# Patient Record
Sex: Female | Born: 1945 | Race: White | Hispanic: No | Marital: Married | Smoking: Never smoker
Health system: Southern US, Community
[De-identification: ages and names within clinical notes are randomized; demographics above are authoritative.]

## PROBLEM LIST (undated history)

## (undated) DIAGNOSIS — C4490 Unspecified malignant neoplasm of skin, unspecified: Secondary | ICD-10-CM

## (undated) HISTORY — DX: Unspecified malignant neoplasm of skin, unspecified: C44.90

---

## 2010-12-17 ENCOUNTER — Other Ambulatory Visit: Payer: Self-pay | Admitting: Hematology & Oncology

## 2010-12-17 ENCOUNTER — Ambulatory Visit (HOSPITAL_BASED_OUTPATIENT_CLINIC_OR_DEPARTMENT_OTHER): Payer: Medicare Other | Admitting: Hematology & Oncology

## 2010-12-17 ENCOUNTER — Other Ambulatory Visit (HOSPITAL_COMMUNITY)
Admission: RE | Admit: 2010-12-17 | Discharge: 2010-12-17 | Disposition: A | Discharge status: Home / Self Care | Payer: Medicare Other | Source: Ambulatory Visit | Attending: Hematology & Oncology | Admitting: Hematology & Oncology

## 2010-12-17 DIAGNOSIS — C9110 Chronic lymphocytic leukemia of B-cell type not having achieved remission: Secondary | ICD-10-CM

## 2010-12-17 LAB — CBC WITH DIFFERENTIAL (CANCER CENTER ONLY)
HCT: 36.2 % (ref 34.8–46.6)
HGB: 12.2 g/dL (ref 11.6–15.9)
MCH: 32.1 pg (ref 26.0–34.0)
MCHC: 33.7 g/dL (ref 32.0–36.0)
MCV: 95 fL (ref 81–101)
Platelets: 132 10*3/uL — ABNORMAL LOW (ref 145–400)
RBC: 3.8 10*6/uL (ref 3.70–5.32)
RDW: 13.2 % (ref 11.1–15.7)
WBC: 48.5 10*3/uL — ABNORMAL HIGH (ref 3.9–10.0)

## 2010-12-17 LAB — MANUAL DIFFERENTIAL (CHCC SATELLITE)
ALC: 46.6 10*3/uL — ABNORMAL HIGH (ref 0.6–2.2)
ANC (CHCC HP manual diff): 1.5 10*3/uL (ref 1.5–6.7)
BASO: 0 % (ref 0–2)
Band Neutrophils: 0 % (ref 0–10)
Blasts: 0 % (ref 0–0)
Eos: 0 % (ref 0–7)
LYMPH: 96 % — ABNORMAL HIGH (ref 14–48)
MONO: 1 % (ref 0–13)
Metamyelocytes: 0 % (ref 0–0)
Myelocytes: 0 % (ref 0–0)
Other Cells: 0 % (ref 0–0)
Other Comments: 0
PLT EST ~~LOC~~: DECREASED
PROMYELO: 0 % (ref 0–0)
RBC Comments: NORMAL
SEG: 3 % — ABNORMAL LOW (ref 40–75)
Variant Lymph: 0 % (ref 0–0)
nRBC: 0 % (ref 0–0)

## 2010-12-17 LAB — CHCC SATELLITE - SMEAR

## 2010-12-18 ENCOUNTER — Ambulatory Visit: Payer: Medicare Other | Admitting: Hematology & Oncology

## 2010-12-19 LAB — COMPREHENSIVE METABOLIC PANEL
ALT: 21 U/L (ref 0–35)
AST: 30 U/L (ref 0–37)
Albumin: 4.3 g/dL (ref 3.5–5.2)
Alkaline Phosphatase: 59 U/L (ref 39–117)
BUN: 13 mg/dL (ref 6–23)
CO2: 23 mEq/L (ref 19–32)
Calcium: 8.8 mg/dL (ref 8.4–10.5)
Chloride: 103 mEq/L (ref 96–112)
Creatinine, Ser: 0.87 mg/dL (ref 0.40–1.20)
Glucose, Bld: 85 mg/dL (ref 70–99)
Potassium: 4.1 mEq/L (ref 3.5–5.3)
Sodium: 138 mEq/L (ref 135–145)
Total Bilirubin: 1.1 mg/dL (ref 0.3–1.2)
Total Protein: 6.1 g/dL (ref 6.0–8.3)

## 2010-12-19 LAB — SPEP & IFE WITH QIG
Albumin ELP: 66.1 % (ref 55.8–66.1)
Alpha-1-Globulin: 3.6 % (ref 2.9–4.9)
Alpha-2-Globulin: 8.8 % (ref 7.1–11.8)
Beta 2: 3.9 % (ref 3.2–6.5)
Beta Globulin: 5.8 % (ref 4.7–7.2)
Gamma Globulin: 11.8 % (ref 11.1–18.8)
IgA: 130 mg/dL (ref 68–378)
IgG (Immunoglobin G), Serum: 807 mg/dL (ref 694–1618)
IgM, Serum: 43 mg/dL — ABNORMAL LOW (ref 60–263)
Total Protein, Serum Electrophoresis: 6.1 g/dL (ref 6.0–8.3)

## 2010-12-19 LAB — RETICULOCYTES (CHCC)
ABS Retic: 58.2 10*3/uL (ref 19.0–186.0)
RBC.: 3.88 MIL/uL (ref 3.87–5.11)
Retic Ct Pct: 1.5 % (ref 0.4–3.1)

## 2010-12-19 LAB — LACTATE DEHYDROGENASE: LDH: 149 U/L (ref 94–250)

## 2010-12-19 LAB — FLOW CYTOMETRY - CHCC SATELLITE

## 2010-12-19 LAB — VITAMIN D 25 HYDROXY (VIT D DEFICIENCY, FRACTURES): Vit D, 25-Hydroxy: 58 ng/mL (ref 30–89)

## 2011-04-23 ENCOUNTER — Encounter: Payer: Medicare Other | Admitting: Hematology & Oncology

## 2011-04-23 ENCOUNTER — Other Ambulatory Visit: Payer: Self-pay | Admitting: Family

## 2011-04-23 LAB — CBC WITH DIFFERENTIAL (CANCER CENTER ONLY)
BASO#: 0.1 10*3/uL (ref 0.0–0.2)
BASO%: 0.2 % (ref 0.0–2.0)
EOS%: 0.3 % (ref 0.0–7.0)
Eosinophils Absolute: 0.2 10*3/uL (ref 0.0–0.5)
HCT: 35.6 % (ref 34.8–46.6)
HGB: 12.4 g/dL (ref 11.6–15.9)
LYMPH#: 44.2 10*3/uL — ABNORMAL HIGH (ref 0.9–3.3)
LYMPH%: 93.9 % — ABNORMAL HIGH (ref 14.0–48.0)
MCH: 33.1 pg (ref 26.0–34.0)
MCHC: 34.8 g/dL (ref 32.0–36.0)
MCV: 95 fL (ref 81–101)
MONO#: 0.6 10*3/uL (ref 0.1–0.9)
MONO%: 1.3 % (ref 0.0–13.0)
NEUT#: 2 10*3/uL (ref 1.5–6.5)
NEUT%: 4.3 % — ABNORMAL LOW (ref 39.6–80.0)
Platelets: 135 10*3/uL — ABNORMAL LOW (ref 145–400)
RBC: 3.75 10*6/uL (ref 3.70–5.32)
RDW: 13.3 % (ref 11.1–15.7)
WBC: 47.1 10*3/uL — ABNORMAL HIGH (ref 3.9–10.0)

## 2011-04-23 LAB — TECHNOLOGIST REVIEW CHCC SATELLITE

## 2011-08-08 ENCOUNTER — Other Ambulatory Visit: Payer: Self-pay | Admitting: Hematology & Oncology

## 2011-08-08 ENCOUNTER — Other Ambulatory Visit (HOSPITAL_BASED_OUTPATIENT_CLINIC_OR_DEPARTMENT_OTHER): Payer: Medicare Other | Admitting: Lab

## 2011-08-08 ENCOUNTER — Ambulatory Visit (HOSPITAL_BASED_OUTPATIENT_CLINIC_OR_DEPARTMENT_OTHER): Payer: Medicare Other | Admitting: Hematology & Oncology

## 2011-08-08 VITALS — BP 115/61 | HR 77 | Temp 97.0°F | Ht 68.5 in | Wt 163.5 lb

## 2011-08-08 DIAGNOSIS — C9110 Chronic lymphocytic leukemia of B-cell type not having achieved remission: Secondary | ICD-10-CM | POA: Diagnosis not present

## 2011-08-08 DIAGNOSIS — M810 Age-related osteoporosis without current pathological fracture: Secondary | ICD-10-CM

## 2011-08-08 LAB — CBC WITH DIFFERENTIAL (CANCER CENTER ONLY)
HCT: 37.8 % (ref 34.8–46.6)
HGB: 12.5 g/dL (ref 11.6–15.9)
MCH: 32.6 pg (ref 26.0–34.0)
MCHC: 33.1 g/dL (ref 32.0–36.0)
MCV: 99 fL (ref 81–101)
Platelets: 165 10*3/uL (ref 145–400)
RBC: 3.83 10*6/uL (ref 3.70–5.32)
RDW: 13.1 % (ref 11.1–15.7)
WBC: 45.1 10*3/uL — ABNORMAL HIGH (ref 3.9–10.0)

## 2011-08-08 LAB — CHCC SATELLITE - SMEAR

## 2011-08-08 LAB — MANUAL DIFFERENTIAL (CHCC SATELLITE)
ALC: 40.1 10*3/uL — ABNORMAL HIGH (ref 0.6–2.2)
ANC (CHCC HP manual diff): 3.2 10*3/uL (ref 1.5–6.7)
Eos: 3 % (ref 0–7)
LYMPH: 89 % — ABNORMAL HIGH (ref 14–48)
MONO: 1 % (ref 0–13)
PLT EST ~~LOC~~: ADEQUATE
Platelet Morphology: NORMAL
SEG: 7 % — ABNORMAL LOW (ref 40–75)

## 2011-08-08 NOTE — Progress Notes (Signed)
This office note has been dictated.

## 2011-08-09 NOTE — Progress Notes (Signed)
CC:   Homero Fellers, MD  DIAGNOSIS:  Stage A chronic lymphocytic leukemia.  CURRENT THERAPY:  Observation.  INTERIM HISTORY:  Ms. Kun comes in for followup.  She is doing well. She has had no problems since we last saw her in May.  She and her husband are heading down to [place] for the next few months.  She has had no fevers, sweats or chills.  She is still taking her multivitamin and supplements.  Patient is staying very active.  She and her husband did go to the [place] I think this fall. They had a nice time.  She has had no rashes.  There has been no change in bowel or bladder habits.  PHYSICAL EXAM:  This is a well-developed, well-nourished white female in no obvious distress.  Vital signs:  Temperature 97, pulse 77, respiratory rate 20, blood pressure 115/61, weight is 165.  Head and neck:  Normocephalic, atraumatic skull.  There are no ocular or oral lesions.  She has no adenopathy in her neck.  Lungs:  Clear bilaterally. Cardiac:  Regular rate and rhythm with normal S1, S2.  There are no murmurs, rubs or bruits.  Axillary:  No bilateral axillary adenopathy. Abdomen:  Soft with good bowel sounds.  There is no palpable abdominal mass.  There is no fluid wave.  There is no palpable hepatosplenomegaly. Extremities:  No clubbing, cyanosis or edema.  Neurological:  No focal neurological deficits.  LABORATORY STUDIES:  White cell count is 45.1, hemoglobin 12.5, hematocrit 37.8, platelet count is 165.  IMPRESSION:  Ms. Dewberry is a 66 year old white female with stage A chronic lymphocytic leukemia.  She has really had no change in her white cell count, appears not thrombocytopenic or anemic.  She will go down to [place] for the winter.  She sees Dr. Marney Doctor down in [place].  I will plan to see her back in 6 months myself.    ______________________________ Josph Macho, M.D. PRE/MEDQ  D:  08/08/2011  T:  08/09/2011  Job:  [id number]

## 2012-01-08 ENCOUNTER — Ambulatory Visit (HOSPITAL_BASED_OUTPATIENT_CLINIC_OR_DEPARTMENT_OTHER): Payer: Medicare Other | Admitting: Hematology & Oncology

## 2012-01-08 ENCOUNTER — Other Ambulatory Visit (HOSPITAL_BASED_OUTPATIENT_CLINIC_OR_DEPARTMENT_OTHER): Payer: Medicare Other | Admitting: Lab

## 2012-01-08 ENCOUNTER — Telehealth: Payer: Self-pay | Admitting: Hematology & Oncology

## 2012-01-08 VITALS — BP 104/53 | HR 78 | Temp 97.1°F | Ht 68.0 in | Wt 161.0 lb

## 2012-01-08 DIAGNOSIS — C9110 Chronic lymphocytic leukemia of B-cell type not having achieved remission: Secondary | ICD-10-CM | POA: Diagnosis not present

## 2012-01-08 DIAGNOSIS — E559 Vitamin D deficiency, unspecified: Secondary | ICD-10-CM | POA: Diagnosis not present

## 2012-01-08 DIAGNOSIS — M810 Age-related osteoporosis without current pathological fracture: Secondary | ICD-10-CM

## 2012-01-08 LAB — CBC WITH DIFFERENTIAL (CANCER CENTER ONLY)
BASO#: 0 10*3/uL (ref 0.0–0.2)
BASO%: 0.1 % (ref 0.0–2.0)
EOS%: 0.2 % (ref 0.0–7.0)
Eosinophils Absolute: 0.1 10*3/uL (ref 0.0–0.5)
HCT: 37.3 % (ref 34.8–46.6)
HGB: 12.4 g/dL (ref 11.6–15.9)
LYMPH#: 46 10*3/uL — ABNORMAL HIGH (ref 0.9–3.3)
LYMPH%: 93.7 % — ABNORMAL HIGH (ref 14.0–48.0)
MCH: 32.3 pg (ref 26.0–34.0)
MCHC: 33.2 g/dL (ref 32.0–36.0)
MCV: 97 fL (ref 81–101)
MONO#: 1.2 10*3/uL — ABNORMAL HIGH (ref 0.1–0.9)
MONO%: 2.4 % (ref 0.0–13.0)
NEUT#: 1.8 10*3/uL (ref 1.5–6.5)
NEUT%: 3.6 % — ABNORMAL LOW (ref 39.6–80.0)
Platelets: 145 10*3/uL (ref 145–400)
RBC: 3.84 10*6/uL (ref 3.70–5.32)
RDW: 13.4 % (ref 11.1–15.7)
WBC: 49.2 10*3/uL — ABNORMAL HIGH (ref 3.9–10.0)

## 2012-01-08 LAB — TECHNOLOGIST REVIEW CHCC SATELLITE

## 2012-01-08 LAB — CHCC SATELLITE - SMEAR

## 2012-01-08 NOTE — Telephone Encounter (Signed)
Left pt message with 07-09-12 appointment and mailed schedule

## 2012-01-08 NOTE — Progress Notes (Signed)
This office note has been dictated.

## 2012-01-09 LAB — COMPREHENSIVE METABOLIC PANEL
ALT: 13 U/L (ref 0–35)
AST: 19 U/L (ref 0–37)
Albumin: 4.3 g/dL (ref 3.5–5.2)
Alkaline Phosphatase: 61 U/L (ref 39–117)
BUN: 11 mg/dL (ref 6–23)
CO2: 26 mEq/L (ref 19–32)
Calcium: 9 mg/dL (ref 8.4–10.5)
Chloride: 105 mEq/L (ref 96–112)
Creatinine, Ser: 0.94 mg/dL (ref 0.50–1.10)
Glucose, Bld: 82 mg/dL (ref 70–99)
Potassium: 4.1 mEq/L (ref 3.5–5.3)
Sodium: 139 mEq/L (ref 135–145)
Total Bilirubin: 0.9 mg/dL (ref 0.3–1.2)
Total Protein: 6.3 g/dL (ref 6.0–8.3)

## 2012-01-09 LAB — VITAMIN D 25 HYDROXY (VIT D DEFICIENCY, FRACTURES): Vit D, 25-Hydroxy: 58 ng/mL (ref 30–89)

## 2012-01-13 NOTE — Progress Notes (Signed)
CC:   Tamara Fellers, MD, Fax [phone]  DIAGNOSIS:  CLL, stage A.  CURRENT THERAPY:  Observation history.  INTERIM HISTORY:  Tamara Powell comes in for followup.  We see her every 6 months.  She is back up from [place].  She will be heading back down in about a month or so.  She is doing well.  Unfortunately, the cousin, my patient, Tamara Powell, passed away from CLL recently.  This is a big loss for everybody.  Tamara Powell was such a wonderful lady who fought very hard.  Tamara Powell, herself, has had no complaints.  There are no problems with fatigue.  She is eating well.  She is taking her mix of supplements, which she feels is having a positive impact on her health.  I really cannot argue with this.  She has had no rashes.  She has not noticed any swollen lymph glands. There has been no change in bowel or bladder habits.  PHYSICAL EXAMINATION:  This is a well-developed, well-nourished white female in no obvious distress.  Vital signs:  97.1, pulse 78, respiratory rate 18, blood pressure 104/57.  Weight is 161.  Head and neck:  A normocephalic, atraumatic skull.  There are no ocular or oral lesions.  There are no palpable cervical or supraclavicular lymph nodes. Lungs:  Clear bilaterally.  Cardiac:  Regular rate and rhythm with a normal S1 and S2.  There are no murmurs, rubs or bruits.  Axillary exam: No axillary adenopathy.  Abdomen:  Soft with good bowel sounds.  There is no palpable abdominal mass.  There is no fluid wave.  There is no palpable hepatosplenomegaly.  Back:  No tenderness over the spine, ribs or hips.  Skin:  No rashes, ecchymoses or petechia.  LABORATORY STUDIES:  White cell count is 49.1, hemoglobin 12.1, hematocrit 37.3, platelet count 145.  White cell differential shows 4% segs, 94% lymphs.  IMPRESSION:  Tamara Powell is a 66 year old white female with CLL.  She is from [place].  She had been followed down in [place] by Dr. Marney Doctor.  She has been followed for  about 5 years now.  I still do not see any indication for progression.  I do not see any indication for interventions.  We will plan to get back in another 6 months.   ______________________________ Tamara Powell, M.D. PRE/MEDQ  D:  01/08/2012  T:  01/09/2012  Job:  [id number]

## 2012-01-28 ENCOUNTER — Telehealth: Payer: Self-pay | Admitting: Hematology & Oncology

## 2012-01-28 NOTE — Telephone Encounter (Addendum)
Message copied by Cathi Roan on Tue Jan 28, 2012  2:12 PM ------      Message from: Arlan Organ R      Created: Wed Jan 15, 2012  6:51 PM       Please call and let her know that her lab work and vitamin D levels are perfect. Pete  01-28-12  2:12 pm  Called and spoke with patient regarding above MD message, she also wanted copy mailed to her, which I did.  S.Trynosky LPN

## 2012-05-16 DIAGNOSIS — B029 Zoster without complications: Secondary | ICD-10-CM | POA: Diagnosis not present

## 2012-05-19 DIAGNOSIS — B029 Zoster without complications: Secondary | ICD-10-CM | POA: Diagnosis not present

## 2012-05-26 DIAGNOSIS — M5430 Sciatica, unspecified side: Secondary | ICD-10-CM | POA: Diagnosis not present

## 2012-05-26 DIAGNOSIS — M810 Age-related osteoporosis without current pathological fracture: Secondary | ICD-10-CM | POA: Diagnosis not present

## 2012-05-26 DIAGNOSIS — H9319 Tinnitus, unspecified ear: Secondary | ICD-10-CM | POA: Diagnosis not present

## 2012-05-26 DIAGNOSIS — Z23 Encounter for immunization: Secondary | ICD-10-CM | POA: Diagnosis not present

## 2012-06-01 ENCOUNTER — Other Ambulatory Visit: Payer: Self-pay | Admitting: *Deleted

## 2012-06-01 ENCOUNTER — Telehealth: Payer: Self-pay | Admitting: Hematology & Oncology

## 2012-06-01 DIAGNOSIS — L509 Urticaria, unspecified: Secondary | ICD-10-CM

## 2012-06-01 DIAGNOSIS — C9110 Chronic lymphocytic leukemia of B-cell type not having achieved remission: Secondary | ICD-10-CM

## 2012-06-01 NOTE — Telephone Encounter (Signed)
Pt called said she needed to see MD sooner. Transferred to RN voice mail, pt aware RN will call her back

## 2012-06-01 NOTE — Progress Notes (Signed)
Pt called today with c/o of hives. Recently returned from [place]. She was treated for shingles approximately 3 weeks ago. Reported she did not complete the full course of therapy due to the side effects. This was not shared with her treating physician as "the areas were getting better". Upon further questioning it was noted that she has been using a different detergent. She gets hives where her skin comes in contact with the sheets but uses a cream that gives her moderate relief. Encouraged her to try to switch back to her old detergent and take Benadryl prn. Appt was made for her to be evaluated by the PA tomorrow as she still has some lesions where she was treated for shingles. She verbalized understanding of all of the instructions.

## 2012-06-01 NOTE — Telephone Encounter (Signed)
Pt aware of 10-29 appointment °

## 2012-06-02 ENCOUNTER — Other Ambulatory Visit (HOSPITAL_BASED_OUTPATIENT_CLINIC_OR_DEPARTMENT_OTHER): Payer: Medicare Other | Admitting: Lab

## 2012-06-02 ENCOUNTER — Other Ambulatory Visit: Payer: Self-pay | Admitting: Medical

## 2012-06-02 ENCOUNTER — Ambulatory Visit (HOSPITAL_BASED_OUTPATIENT_CLINIC_OR_DEPARTMENT_OTHER): Payer: Medicare Other | Admitting: Medical

## 2012-06-02 VITALS — BP 120/50 | HR 91 | Temp 97.7°F | Resp 18 | Ht 68.0 in | Wt 163.0 lb

## 2012-06-02 DIAGNOSIS — C9110 Chronic lymphocytic leukemia of B-cell type not having achieved remission: Secondary | ICD-10-CM

## 2012-06-02 DIAGNOSIS — H5710 Ocular pain, unspecified eye: Secondary | ICD-10-CM | POA: Diagnosis not present

## 2012-06-02 DIAGNOSIS — B029 Zoster without complications: Secondary | ICD-10-CM | POA: Diagnosis not present

## 2012-06-02 LAB — CBC WITH DIFFERENTIAL (CANCER CENTER ONLY)
BASO#: 0 10*3/uL (ref 0.0–0.2)
BASO%: 0.1 % (ref 0.0–2.0)
EOS%: 0.2 % (ref 0.0–7.0)
Eosinophils Absolute: 0.1 10*3/uL (ref 0.0–0.5)
HCT: 37.2 % (ref 34.8–46.6)
HGB: 12.5 g/dL (ref 11.6–15.9)
LYMPH#: 52.3 10*3/uL — ABNORMAL HIGH (ref 0.9–3.3)
LYMPH%: 92.7 % — ABNORMAL HIGH (ref 14.0–48.0)
MCH: 33.2 pg (ref 26.0–34.0)
MCHC: 33.6 g/dL (ref 32.0–36.0)
MCV: 99 fL (ref 81–101)
MONO#: 1.1 10*3/uL — ABNORMAL HIGH (ref 0.1–0.9)
MONO%: 1.9 % (ref 0.0–13.0)
NEUT#: 2.9 10*3/uL (ref 1.5–6.5)
NEUT%: 5.1 % — ABNORMAL LOW (ref 39.6–80.0)
Platelets: 130 10*3/uL — ABNORMAL LOW (ref 145–400)
RBC: 3.76 10*6/uL (ref 3.70–5.32)
RDW: 14 % (ref 11.1–15.7)
WBC: 56.4 10*3/uL (ref 3.9–10.0)

## 2012-06-02 LAB — TECHNOLOGIST REVIEW CHCC SATELLITE

## 2012-06-02 NOTE — Progress Notes (Addendum)
Diagnosis: CLL, stage A.   Current therapy: Observation.  Interim history: I am seeing Tamara Powell today as a work in.  Ms. Ishibashi reports, that close about a month ago.  She was diagnosed with shingles that appeared on the right side of her face.  At that time she was given steroids, along with acyclovir.  She states she took the medications for about 7 days and then discontinued them due to stomach upset, nausea, and dizziness.  Ms. Barnie Alderman, and reports, on October 12.  She noticed similar symptoms on the left side of her abdomen.  She thought that it might just be a rash and decided to wait until now to get it checked out.  Ms. Tyer does have what appears to be shingles.  She does have a band like cluster of clear vesicles on an erythematous base.  She reports, that she is using a cream called Silver Shield that calms down the tingling and burning sensation.  Unfortunately, it is not within the 72 hours to prescribe her an antiviral.  She otherwise does not report any other new problems or complications.  In terms of her CLL, she still continues to do well.  Her white count did creep up from 49.2, to 56.4, however, I did inform her that the increase may be due to the steroids, that she was on.  We will be able to get a clear picture of her white count when she returns in December.  She reports, she has a good appetite.  She denies any nausea, vomiting, diarrhea, constipation, chest pain, shortness of breath, or cough.  Denies any fevers, chills, or night sweats.  She denies any headaches, visual changes, or rashes.  Other than the shingles.  She's not noticed any swollen lymph glands.  She denies any obvious, or abnormal bleeding. Review of Systems: Pt. Denies any changes in their vision, hearing, adenopathy, fevers, chills, nausea, vomiting, diarrhea, constipation, chest pain, shortness of breath, passing blood, passing out, blacking out,  any changes in skin, joints, neurologic or psychiatric except as  noted.  Physical Exam: This is a 66 year old, well-developed, well-nourished, white female, in no obvious distress Vitals: Temperature 97.7 degrees, pulse 91, respiration 18, blood pressure 120/50.  Weight 163 pounds HEENT reveals a normocephalic, atraumatic skull, no scleral icterus, no oral lesions  Neck is supple without any cervical or supraclavicular adenopathy.  Lungs are clear to auscultation bilaterally. There are no wheezes, rales or rhonci Cardiac is regular rate and rhythm with a normal S1 and S2. There are no murmurs, rubs, or bruits.  Abdomen is soft with good bowel sounds, there is no palpable mass. There is no palpable hepatosplenomegaly. There is no palpable fluid wave.  She does have a strip of vesicular lesions on an erythematous base on the left side of her lower abdomen. Musculoskeletal no tenderness of the spine, ribs, or hips.  Extremities there are no clubbing, cyanosis, or edema.  Skin no petechia, purpura or ecchymosis Neurologic is nonfocal.  Laboratory Data: White count 56.4, hemoglobin 12.5, hematocrit 37.2, platelets 130,000  Current Outpatient Prescriptions on File Prior to Visit  Medication Sig Dispense Refill  . Apple Cid Vn-Grn Tea-Bit Or-Cr (GREEN TEA SLIM WITH EGCG PO) Take by mouth.        Marland Kitchen aspirin 81 MG tablet Take 81 mg by mouth daily.        . calcium carbonate (OS-CAL) 600 MG TABS Take 600 mg by mouth 2 (two) times daily with a meal.        .  co-enzyme Q-10 30 MG capsule Take 100 mg by mouth 2 (two) times daily.        Marland Kitchen conjugated estrogens (PREMARIN) vaginal cream Place vaginally daily.        . Evening Primrose Oil 500 MG CAPS Take 500 mg by mouth 2 (two) times daily.        . fish oil-omega-3 fatty acids 1000 MG capsule Take 1 g by mouth 2 (two) times daily.        . Multiple Vitamin (MULTIVITAMIN) tablet Take 1 tablet by mouth daily.        . Nutritional Supplements (GRAPESEED EXTRACT) 500-50 MG CAPS Take by mouth 2 (two) times daily.        Marland Kitchen  OVER THE COUNTER MEDICATION Take by mouth daily. Herb that give her  a calming effect with energy.      . Turmeric 450 MG CAPS Take by mouth every morning.       Assessment/Plan: This is a pleasant, 66 year old, female, with the following issues:  #1 .  Shingles.  She does have a propensity of getting shingles, secondary to her CLL.  I am wondering if the steroids, that she was placed on for her previous episode of shingles caused another outbreak.  She did discontinue the steroids.  She will continue using her Silver Shield, lotion, which seems to help.  She did not want anything for pain.  Hopefully, this will resolve within 2-4 weeks.  I informed her that if she has any more outbreaks or problems with her skin to let us know.  #2 CLL.  This is stage A.  At this point, we will continue to follow her with blood work.  We will have a better idea of what her white count is when she returns in December.  Currently, there is no indication for interventions.  #3 prophylaxis.  I will go ahead and call in Famvir 250 mg to take on a daily basis.  #4 followup.  Ms. Bischoff follow back up with [place] in December, but before then should there be questions or concerns.

## 2012-06-29 DIAGNOSIS — M999 Biomechanical lesion, unspecified: Secondary | ICD-10-CM | POA: Diagnosis not present

## 2012-06-29 DIAGNOSIS — M62838 Other muscle spasm: Secondary | ICD-10-CM | POA: Diagnosis not present

## 2012-06-29 DIAGNOSIS — M5137 Other intervertebral disc degeneration, lumbosacral region: Secondary | ICD-10-CM | POA: Diagnosis not present

## 2012-06-29 DIAGNOSIS — M5430 Sciatica, unspecified side: Secondary | ICD-10-CM | POA: Diagnosis not present

## 2012-06-30 DIAGNOSIS — M999 Biomechanical lesion, unspecified: Secondary | ICD-10-CM | POA: Diagnosis not present

## 2012-06-30 DIAGNOSIS — M5137 Other intervertebral disc degeneration, lumbosacral region: Secondary | ICD-10-CM | POA: Diagnosis not present

## 2012-06-30 DIAGNOSIS — M62838 Other muscle spasm: Secondary | ICD-10-CM | POA: Diagnosis not present

## 2012-06-30 DIAGNOSIS — M5430 Sciatica, unspecified side: Secondary | ICD-10-CM | POA: Diagnosis not present

## 2012-07-01 DIAGNOSIS — M62838 Other muscle spasm: Secondary | ICD-10-CM | POA: Diagnosis not present

## 2012-07-01 DIAGNOSIS — M999 Biomechanical lesion, unspecified: Secondary | ICD-10-CM | POA: Diagnosis not present

## 2012-07-01 DIAGNOSIS — M5430 Sciatica, unspecified side: Secondary | ICD-10-CM | POA: Diagnosis not present

## 2012-07-01 DIAGNOSIS — M5137 Other intervertebral disc degeneration, lumbosacral region: Secondary | ICD-10-CM | POA: Diagnosis not present

## 2012-07-03 DIAGNOSIS — M62838 Other muscle spasm: Secondary | ICD-10-CM | POA: Diagnosis not present

## 2012-07-03 DIAGNOSIS — M5430 Sciatica, unspecified side: Secondary | ICD-10-CM | POA: Diagnosis not present

## 2012-07-03 DIAGNOSIS — M999 Biomechanical lesion, unspecified: Secondary | ICD-10-CM | POA: Diagnosis not present

## 2012-07-03 DIAGNOSIS — M5137 Other intervertebral disc degeneration, lumbosacral region: Secondary | ICD-10-CM | POA: Diagnosis not present

## 2012-07-06 DIAGNOSIS — M5430 Sciatica, unspecified side: Secondary | ICD-10-CM | POA: Diagnosis not present

## 2012-07-06 DIAGNOSIS — M62838 Other muscle spasm: Secondary | ICD-10-CM | POA: Diagnosis not present

## 2012-07-06 DIAGNOSIS — M999 Biomechanical lesion, unspecified: Secondary | ICD-10-CM | POA: Diagnosis not present

## 2012-07-06 DIAGNOSIS — M5137 Other intervertebral disc degeneration, lumbosacral region: Secondary | ICD-10-CM | POA: Diagnosis not present

## 2012-07-07 DIAGNOSIS — M999 Biomechanical lesion, unspecified: Secondary | ICD-10-CM | POA: Diagnosis not present

## 2012-07-07 DIAGNOSIS — M5430 Sciatica, unspecified side: Secondary | ICD-10-CM | POA: Diagnosis not present

## 2012-07-07 DIAGNOSIS — M5137 Other intervertebral disc degeneration, lumbosacral region: Secondary | ICD-10-CM | POA: Diagnosis not present

## 2012-07-07 DIAGNOSIS — M62838 Other muscle spasm: Secondary | ICD-10-CM | POA: Diagnosis not present

## 2012-07-09 ENCOUNTER — Other Ambulatory Visit (HOSPITAL_BASED_OUTPATIENT_CLINIC_OR_DEPARTMENT_OTHER): Payer: Medicare Other | Admitting: Lab

## 2012-07-09 ENCOUNTER — Ambulatory Visit (HOSPITAL_BASED_OUTPATIENT_CLINIC_OR_DEPARTMENT_OTHER): Payer: Medicare Other | Admitting: Hematology & Oncology

## 2012-07-09 VITALS — BP 110/52 | HR 88 | Temp 97.7°F | Resp 16 | Ht 68.0 in | Wt 165.0 lb

## 2012-07-09 DIAGNOSIS — M5430 Sciatica, unspecified side: Secondary | ICD-10-CM | POA: Diagnosis not present

## 2012-07-09 DIAGNOSIS — C9110 Chronic lymphocytic leukemia of B-cell type not having achieved remission: Secondary | ICD-10-CM | POA: Diagnosis not present

## 2012-07-09 DIAGNOSIS — M62838 Other muscle spasm: Secondary | ICD-10-CM | POA: Diagnosis not present

## 2012-07-09 DIAGNOSIS — M999 Biomechanical lesion, unspecified: Secondary | ICD-10-CM | POA: Diagnosis not present

## 2012-07-09 DIAGNOSIS — M5137 Other intervertebral disc degeneration, lumbosacral region: Secondary | ICD-10-CM | POA: Diagnosis not present

## 2012-07-09 LAB — CBC WITH DIFFERENTIAL (CANCER CENTER ONLY)
BASO#: 0.2 10*3/uL (ref 0.0–0.2)
BASO%: 0.4 % (ref 0.0–2.0)
EOS%: 0.4 % (ref 0.0–7.0)
Eosinophils Absolute: 0.2 10*3/uL (ref 0.0–0.5)
HCT: 38.2 % (ref 34.8–46.6)
HGB: 13 g/dL (ref 11.6–15.9)
LYMPH#: 38.7 10*3/uL — ABNORMAL HIGH (ref 0.9–3.3)
LYMPH%: 90.8 % — ABNORMAL HIGH (ref 14.0–48.0)
MCH: 32.8 pg (ref 26.0–34.0)
MCHC: 34 g/dL (ref 32.0–36.0)
MCV: 97 fL (ref 81–101)
MONO#: 0.8 10*3/uL (ref 0.1–0.9)
MONO%: 1.9 % (ref 0.0–13.0)
NEUT#: 2.8 10*3/uL (ref 1.5–6.5)
NEUT%: 6.5 % — ABNORMAL LOW (ref 39.6–80.0)
Platelets: 177 10*3/uL (ref 145–400)
RBC: 3.96 10*6/uL (ref 3.70–5.32)
RDW: 13.2 % (ref 11.1–15.7)
WBC: 42.6 10*3/uL — ABNORMAL HIGH (ref 3.9–10.0)

## 2012-07-09 LAB — CHCC SATELLITE - SMEAR

## 2012-07-09 LAB — TECHNOLOGIST REVIEW CHCC SATELLITE

## 2012-07-09 NOTE — Progress Notes (Signed)
This office note has been dictated.

## 2012-07-13 DIAGNOSIS — M5430 Sciatica, unspecified side: Secondary | ICD-10-CM | POA: Diagnosis not present

## 2012-07-13 DIAGNOSIS — M5137 Other intervertebral disc degeneration, lumbosacral region: Secondary | ICD-10-CM | POA: Diagnosis not present

## 2012-07-13 DIAGNOSIS — M999 Biomechanical lesion, unspecified: Secondary | ICD-10-CM | POA: Diagnosis not present

## 2012-07-13 DIAGNOSIS — M62838 Other muscle spasm: Secondary | ICD-10-CM | POA: Diagnosis not present

## 2012-07-13 NOTE — Progress Notes (Signed)
CC:   Homero Fellers, MD, Fax [phone]  DIAGNOSIS:  Stage A chronic lymphocytic leukemia.  CURRENT THERAPY:  Observation.  INTERIM HISTORY:  Ms. Ruddy comes in for her followup.  She is really doing well.  She is having no issues at all.  She did have some shingles, I think in the right side of her face.  This was probably about 2 months ago.  This has not caused her any problems with post- herpetic neuralgia.  She has sciatica down the left leg.  She is seeing a [facility] for this.  She has not had any problems with fevers, sweats, or chills.  There is no change in bowel or bladder habits.  There is no cough.  There is no abdominal pain.  PHYSICAL EXAMINATION:  General:  This is a well-developed, well- nourished white female in no obvious distress.  Vital signs: Temperature 97.7, pulse 88, respiratory rate 16, blood pressure 110/52. Weight is 165.  Head and neck:  Normocephalic, atraumatic skull.  There are no ocular or oral lesions.  There are no palpable cervical or supraclavicular lymph nodes.  Lungs:  Clear bilaterally.  Cardiac: Regular rate and rhythm with a normal S1 and S2.  There are no murmurs, rubs, or bruits.  Axillary:  Shows no bilateral axillary adenopathy. Abdomen:  Soft with good bowel sounds.  There is no fluid wave.  There is no palpable hepatosplenomegaly.  Extremities:  No clubbing, cyanosis, or edema.  LABORATORY STUDIES:  White cell count is 42.6, hemoglobin is 13, hematocrit 38.2, platelet count 177.  White cell differential shows 6.5 segs and 91 lymphs.  IMPRESSION:  Ms. Vreeland is a very nice 66 year old white female with stage A CLL.  She is doing well with this.  We do not have to treat her from my point of view.  We will plan to get her back in about 3 or 4 months now.  I feel confident that we can get her through the wintertime without having to see her back.  I think if she gets another episode of shingles, then she will definitely need to  be on long-term Famvir.    ______________________________ Josph Macho, M.D. PRE/MEDQ  D:  07/09/2012  T:  07/10/2012  Job:  [id number]

## 2012-07-14 DIAGNOSIS — M999 Biomechanical lesion, unspecified: Secondary | ICD-10-CM | POA: Diagnosis not present

## 2012-07-14 DIAGNOSIS — M5430 Sciatica, unspecified side: Secondary | ICD-10-CM | POA: Diagnosis not present

## 2012-07-14 DIAGNOSIS — M5137 Other intervertebral disc degeneration, lumbosacral region: Secondary | ICD-10-CM | POA: Diagnosis not present

## 2012-07-14 DIAGNOSIS — M62838 Other muscle spasm: Secondary | ICD-10-CM | POA: Diagnosis not present

## 2012-07-16 DIAGNOSIS — M62838 Other muscle spasm: Secondary | ICD-10-CM | POA: Diagnosis not present

## 2012-07-16 DIAGNOSIS — M999 Biomechanical lesion, unspecified: Secondary | ICD-10-CM | POA: Diagnosis not present

## 2012-07-16 DIAGNOSIS — M5430 Sciatica, unspecified side: Secondary | ICD-10-CM | POA: Diagnosis not present

## 2012-07-16 DIAGNOSIS — M5137 Other intervertebral disc degeneration, lumbosacral region: Secondary | ICD-10-CM | POA: Diagnosis not present

## 2012-07-20 DIAGNOSIS — M999 Biomechanical lesion, unspecified: Secondary | ICD-10-CM | POA: Diagnosis not present

## 2012-07-20 DIAGNOSIS — M62838 Other muscle spasm: Secondary | ICD-10-CM | POA: Diagnosis not present

## 2012-07-20 DIAGNOSIS — M5430 Sciatica, unspecified side: Secondary | ICD-10-CM | POA: Diagnosis not present

## 2012-07-20 DIAGNOSIS — M5137 Other intervertebral disc degeneration, lumbosacral region: Secondary | ICD-10-CM | POA: Diagnosis not present

## 2012-07-21 DIAGNOSIS — M62838 Other muscle spasm: Secondary | ICD-10-CM | POA: Diagnosis not present

## 2012-07-21 DIAGNOSIS — M5137 Other intervertebral disc degeneration, lumbosacral region: Secondary | ICD-10-CM | POA: Diagnosis not present

## 2012-07-21 DIAGNOSIS — M5430 Sciatica, unspecified side: Secondary | ICD-10-CM | POA: Diagnosis not present

## 2012-07-21 DIAGNOSIS — M999 Biomechanical lesion, unspecified: Secondary | ICD-10-CM | POA: Diagnosis not present

## 2012-07-23 DIAGNOSIS — M62838 Other muscle spasm: Secondary | ICD-10-CM | POA: Diagnosis not present

## 2012-07-23 DIAGNOSIS — M5430 Sciatica, unspecified side: Secondary | ICD-10-CM | POA: Diagnosis not present

## 2012-07-23 DIAGNOSIS — M999 Biomechanical lesion, unspecified: Secondary | ICD-10-CM | POA: Diagnosis not present

## 2012-07-23 DIAGNOSIS — M5137 Other intervertebral disc degeneration, lumbosacral region: Secondary | ICD-10-CM | POA: Diagnosis not present

## 2012-07-28 DIAGNOSIS — M5430 Sciatica, unspecified side: Secondary | ICD-10-CM | POA: Diagnosis not present

## 2012-07-28 DIAGNOSIS — M5137 Other intervertebral disc degeneration, lumbosacral region: Secondary | ICD-10-CM | POA: Diagnosis not present

## 2012-07-28 DIAGNOSIS — M999 Biomechanical lesion, unspecified: Secondary | ICD-10-CM | POA: Diagnosis not present

## 2012-07-28 DIAGNOSIS — M62838 Other muscle spasm: Secondary | ICD-10-CM | POA: Diagnosis not present

## 2012-07-30 DIAGNOSIS — M5137 Other intervertebral disc degeneration, lumbosacral region: Secondary | ICD-10-CM | POA: Diagnosis not present

## 2012-07-30 DIAGNOSIS — M62838 Other muscle spasm: Secondary | ICD-10-CM | POA: Diagnosis not present

## 2012-07-30 DIAGNOSIS — M5430 Sciatica, unspecified side: Secondary | ICD-10-CM | POA: Diagnosis not present

## 2012-07-30 DIAGNOSIS — M999 Biomechanical lesion, unspecified: Secondary | ICD-10-CM | POA: Diagnosis not present

## 2012-08-03 DIAGNOSIS — M999 Biomechanical lesion, unspecified: Secondary | ICD-10-CM | POA: Diagnosis not present

## 2012-08-03 DIAGNOSIS — M5430 Sciatica, unspecified side: Secondary | ICD-10-CM | POA: Diagnosis not present

## 2012-08-03 DIAGNOSIS — M5137 Other intervertebral disc degeneration, lumbosacral region: Secondary | ICD-10-CM | POA: Diagnosis not present

## 2012-08-03 DIAGNOSIS — M62838 Other muscle spasm: Secondary | ICD-10-CM | POA: Diagnosis not present

## 2012-08-06 DIAGNOSIS — M5430 Sciatica, unspecified side: Secondary | ICD-10-CM | POA: Diagnosis not present

## 2012-08-06 DIAGNOSIS — M5137 Other intervertebral disc degeneration, lumbosacral region: Secondary | ICD-10-CM | POA: Diagnosis not present

## 2012-08-06 DIAGNOSIS — M999 Biomechanical lesion, unspecified: Secondary | ICD-10-CM | POA: Diagnosis not present

## 2012-08-06 DIAGNOSIS — M62838 Other muscle spasm: Secondary | ICD-10-CM | POA: Diagnosis not present

## 2012-08-11 DIAGNOSIS — M5430 Sciatica, unspecified side: Secondary | ICD-10-CM | POA: Diagnosis not present

## 2012-08-11 DIAGNOSIS — M999 Biomechanical lesion, unspecified: Secondary | ICD-10-CM | POA: Diagnosis not present

## 2012-08-11 DIAGNOSIS — M62838 Other muscle spasm: Secondary | ICD-10-CM | POA: Diagnosis not present

## 2012-08-11 DIAGNOSIS — M5137 Other intervertebral disc degeneration, lumbosacral region: Secondary | ICD-10-CM | POA: Diagnosis not present

## 2012-08-13 DIAGNOSIS — M5137 Other intervertebral disc degeneration, lumbosacral region: Secondary | ICD-10-CM | POA: Diagnosis not present

## 2012-08-13 DIAGNOSIS — M62838 Other muscle spasm: Secondary | ICD-10-CM | POA: Diagnosis not present

## 2012-08-13 DIAGNOSIS — M5430 Sciatica, unspecified side: Secondary | ICD-10-CM | POA: Diagnosis not present

## 2012-08-13 DIAGNOSIS — M999 Biomechanical lesion, unspecified: Secondary | ICD-10-CM | POA: Diagnosis not present

## 2012-08-14 DIAGNOSIS — H2510 Age-related nuclear cataract, unspecified eye: Secondary | ICD-10-CM | POA: Diagnosis not present

## 2012-08-14 DIAGNOSIS — H5200 Hypermetropia, unspecified eye: Secondary | ICD-10-CM | POA: Diagnosis not present

## 2012-08-18 DIAGNOSIS — M5430 Sciatica, unspecified side: Secondary | ICD-10-CM | POA: Diagnosis not present

## 2012-08-18 DIAGNOSIS — M999 Biomechanical lesion, unspecified: Secondary | ICD-10-CM | POA: Diagnosis not present

## 2012-08-18 DIAGNOSIS — M5137 Other intervertebral disc degeneration, lumbosacral region: Secondary | ICD-10-CM | POA: Diagnosis not present

## 2012-08-18 DIAGNOSIS — M62838 Other muscle spasm: Secondary | ICD-10-CM | POA: Diagnosis not present

## 2012-08-27 DIAGNOSIS — E559 Vitamin D deficiency, unspecified: Secondary | ICD-10-CM | POA: Diagnosis not present

## 2012-08-27 DIAGNOSIS — E7800 Pure hypercholesterolemia, unspecified: Secondary | ICD-10-CM | POA: Diagnosis not present

## 2012-08-27 DIAGNOSIS — Z79899 Other long term (current) drug therapy: Secondary | ICD-10-CM | POA: Diagnosis not present

## 2012-09-03 DIAGNOSIS — E2839 Other primary ovarian failure: Secondary | ICD-10-CM | POA: Diagnosis not present

## 2012-09-03 DIAGNOSIS — M899 Disorder of bone, unspecified: Secondary | ICD-10-CM | POA: Diagnosis not present

## 2012-09-03 DIAGNOSIS — M949 Disorder of cartilage, unspecified: Secondary | ICD-10-CM | POA: Diagnosis not present

## 2012-09-03 DIAGNOSIS — C9100 Acute lymphoblastic leukemia not having achieved remission: Secondary | ICD-10-CM | POA: Diagnosis not present

## 2012-09-03 DIAGNOSIS — M810 Age-related osteoporosis without current pathological fracture: Secondary | ICD-10-CM | POA: Diagnosis not present

## 2012-10-09 DIAGNOSIS — Z823 Family history of stroke: Secondary | ICD-10-CM | POA: Diagnosis not present

## 2012-10-29 ENCOUNTER — Ambulatory Visit: Payer: Medicare Other | Admitting: Hematology & Oncology

## 2012-10-29 ENCOUNTER — Other Ambulatory Visit: Payer: Medicare Other | Admitting: Lab

## 2012-10-29 ENCOUNTER — Ambulatory Visit (HOSPITAL_BASED_OUTPATIENT_CLINIC_OR_DEPARTMENT_OTHER): Payer: Medicare Other | Admitting: Medical

## 2012-10-29 ENCOUNTER — Other Ambulatory Visit (HOSPITAL_BASED_OUTPATIENT_CLINIC_OR_DEPARTMENT_OTHER): Payer: Medicare Other | Admitting: Lab

## 2012-10-29 ENCOUNTER — Encounter: Payer: Self-pay | Admitting: *Deleted

## 2012-10-29 VITALS — BP 106/53 | HR 77 | Temp 97.7°F | Resp 16 | Ht 68.0 in | Wt 165.0 lb

## 2012-10-29 DIAGNOSIS — C9110 Chronic lymphocytic leukemia of B-cell type not having achieved remission: Secondary | ICD-10-CM

## 2012-10-29 LAB — MANUAL DIFFERENTIAL (CHCC SATELLITE)
ALC: 42.5 10*3/uL — ABNORMAL HIGH (ref 0.6–2.2)
ANC (CHCC HP manual diff): 2.7 10*3/uL (ref 1.5–6.7)
LYMPH: 94 % — ABNORMAL HIGH (ref 14–48)
PLT EST ~~LOC~~: ADEQUATE
SEG: 6 % — ABNORMAL LOW (ref 40–75)

## 2012-10-29 LAB — CBC WITH DIFFERENTIAL (CANCER CENTER ONLY)
HCT: 37.8 % (ref 34.8–46.6)
HGB: 12.7 g/dL (ref 11.6–15.9)
MCH: 32.4 pg (ref 26.0–34.0)
MCHC: 33.6 g/dL (ref 32.0–36.0)
MCV: 96 fL (ref 81–101)
Platelets: 151 10*3/uL (ref 145–400)
RBC: 3.92 10*6/uL (ref 3.70–5.32)
RDW: 14.1 % (ref 11.1–15.7)
WBC: 45.2 10*3/uL — ABNORMAL HIGH (ref 3.9–10.0)

## 2012-10-29 LAB — CHCC SATELLITE - SMEAR

## 2012-10-29 NOTE — Progress Notes (Signed)
Diagnosis: CLL, stage A.   Current therapy: Observation.  Interim history: This film presents today for an office followup visit.  Overall, she, reports, that she's doing quite well.  She's not had any more issues with shingles.  She does have some sciatica down the left leg.  She sees a [facility] for this.  She's not reported any new problems or complications.  She's not reporting any new infections.  Her white count is holding steady at 45.2.  we have just been keeping her on observation.  From our standpoint, she's been doing well.  She reports, she has a good appetite.  She denies any nausea, vomiting, diarrhea, constipation, chest pain, shortness of breath, or cough.  Denies any fevers, chills, or night sweats.  She denies any headaches, visual changes, or rashes.  Other than the shingles.  She's not noticed any swollen lymph glands.  She denies any obvious, or abnormal bleeding.  Review of Systems: Constitutional:Negative for malaise/fatigue, fever, chills, weight loss, diaphoresis, activity change, appetite change, and unexpected weight change.  HEENT: Negative for double vision, blurred vision, visual loss, ear pain, tinnitus, congestion, rhinorrhea, epistaxis sore throat or sinus disease, oral pain/lesion, tongue soreness Respiratory: Negative for cough, chest tightness, shortness of breath, wheezing and stridor.  Cardiovascular: Negative for chest pain, palpitations, leg swelling, orthopnea, PND, DOE or claudication Gastrointestinal: Negative for nausea, vomiting, abdominal pain, diarrhea, constipation, blood in stool, melena, hematochezia, abdominal distention, anal bleeding, rectal pain, anorexia and hematemesis.  Genitourinary: Negative for dysuria, frequency, hematuria,  Musculoskeletal: Negative for myalgias, back pain, joint swelling, arthralgias and gait problem.  Skin: Negative for rash, color change, pallor and wound.  Neurological:. Negative for dizziness/light-headedness,  tremors, seizures, syncope, facial asymmetry, speech difficulty, weakness, numbness, headaches and paresthesias.  Hematological: Negative for adenopathy. Does not bruise/bleed easily.  Psychiatric/Behavioral:  Negative for depression, no loss of interest in normal activity or change in sleep pattern.   Physical Exam: This is a 67 year old, well-developed, well-nourished, white female, in no obvious distress Vitals: Temperature 97.7 degrees, pulse 77, respirations 16, blood pressure 114/44.  Weight 165 pounds HEENT reveals a normocephalic, atraumatic skull, no scleral icterus, no oral lesions  Neck is supple without any cervical or supraclavicular adenopathy.  Lungs are clear to auscultation bilaterally. There are no wheezes, rales or rhonci Cardiac is regular rate and rhythm with a normal S1 and S2. There are no murmurs, rubs, or bruits.  Abdomen is soft with good bowel sounds, there is no palpable mass. There is no palpable hepatosplenomegaly. There is no palpable fluid wave.  She does have a strip of vesicular lesions on an erythematous base on the left side of her lower abdomen. Musculoskeletal no tenderness of the spine, ribs, or hips.  Extremities there are no clubbing, cyanosis, or edema.  Skin no petechia, purpura or ecchymosis Neurologic is nonfocal.  Laboratory Data: White count 45.2, hemoglobin 12.7, hematocrit 37.8, platelets 151,000  Current Outpatient Prescriptions on File Prior to Visit  Medication Sig Dispense Refill  . Apple Cid Vn-Grn Tea-Bit Or-Cr (GREEN TEA SLIM WITH EGCG PO) Take by mouth.        Marland Kitchen aspirin 81 MG tablet Take 81 mg by mouth daily.        . calcium carbonate (OS-CAL) 600 MG TABS Take 600 mg by mouth 2 (two) times daily with a meal.        . co-enzyme Q-10 30 MG capsule Take 100 mg by mouth 2 (two) times daily.        Marland Kitchen  conjugated estrogens (PREMARIN) vaginal cream Place vaginally once a week.       . Evening Primrose Oil 500 MG CAPS Take 500 mg by mouth 2  (two) times daily.        . fish oil-omega-3 fatty acids 1000 MG capsule Take 1 g by mouth 2 (two) times daily.        . Multiple Vitamin (MULTIVITAMIN) tablet Take 1 tablet by mouth daily.        . Nutritional Supplements (GRAPESEED EXTRACT) 500-50 MG CAPS Take by mouth 2 (two) times daily.        Marland Kitchen OVER THE COUNTER MEDICATION Take by mouth 2 (two) times daily. WELLNESS FORMULA CAPSULE--HAIR SKIN NAILS CAPSULES.      . Turmeric 450 MG CAPS Take by mouth every morning.       No current facility-administered medications on file prior to visit.   Assessment/Plan: This is a pleasant, 67 year old, female, with the following issues:  #1 CLL.  This is stage A. , her white count is stable.  We do not have to treat her from our point of view.  We will continue to monitor her closely.  She remains asymptomatic.  #2.  History of shingles.  She's not had any more episodes.  #3.  Followup.  Tamara Powell follow back up with [place] in 3 months, but before then should there be questions or concerns.

## 2012-11-25 DIAGNOSIS — R928 Other abnormal and inconclusive findings on diagnostic imaging of breast: Secondary | ICD-10-CM | POA: Diagnosis not present

## 2012-11-25 DIAGNOSIS — Z1231 Encounter for screening mammogram for malignant neoplasm of breast: Secondary | ICD-10-CM | POA: Diagnosis not present

## 2012-12-07 DIAGNOSIS — R5383 Other fatigue: Secondary | ICD-10-CM | POA: Diagnosis not present

## 2012-12-07 DIAGNOSIS — Z79899 Other long term (current) drug therapy: Secondary | ICD-10-CM | POA: Diagnosis not present

## 2012-12-07 DIAGNOSIS — J309 Allergic rhinitis, unspecified: Secondary | ICD-10-CM | POA: Diagnosis not present

## 2012-12-07 DIAGNOSIS — R5381 Other malaise: Secondary | ICD-10-CM | POA: Diagnosis not present

## 2012-12-23 DIAGNOSIS — R928 Other abnormal and inconclusive findings on diagnostic imaging of breast: Secondary | ICD-10-CM | POA: Diagnosis not present

## 2012-12-31 DIAGNOSIS — R928 Other abnormal and inconclusive findings on diagnostic imaging of breast: Secondary | ICD-10-CM | POA: Diagnosis not present

## 2012-12-31 DIAGNOSIS — N6089 Other benign mammary dysplasias of unspecified breast: Secondary | ICD-10-CM | POA: Diagnosis not present

## 2012-12-31 DIAGNOSIS — N6019 Diffuse cystic mastopathy of unspecified breast: Secondary | ICD-10-CM | POA: Diagnosis not present

## 2013-01-04 DIAGNOSIS — J069 Acute upper respiratory infection, unspecified: Secondary | ICD-10-CM | POA: Diagnosis not present

## 2013-01-17 ENCOUNTER — Inpatient Hospital Stay: Admit: 2013-01-17 | Payer: Self-pay | Admitting: Internal Medicine

## 2013-01-18 DIAGNOSIS — R079 Chest pain, unspecified: Secondary | ICD-10-CM | POA: Diagnosis not present

## 2013-01-18 DIAGNOSIS — IMO0002 Reserved for concepts with insufficient information to code with codable children: Secondary | ICD-10-CM | POA: Diagnosis not present

## 2013-01-18 DIAGNOSIS — Z856 Personal history of leukemia: Secondary | ICD-10-CM | POA: Diagnosis not present

## 2013-01-18 DIAGNOSIS — S82899A Other fracture of unspecified lower leg, initial encounter for closed fracture: Secondary | ICD-10-CM | POA: Diagnosis not present

## 2013-01-18 DIAGNOSIS — S298XXA Other specified injuries of thorax, initial encounter: Secondary | ICD-10-CM | POA: Diagnosis not present

## 2013-01-18 DIAGNOSIS — W1789XA Other fall from one level to another, initial encounter: Secondary | ICD-10-CM | POA: Diagnosis not present

## 2013-01-18 DIAGNOSIS — S92919A Unspecified fracture of unspecified toe(s), initial encounter for closed fracture: Secondary | ICD-10-CM | POA: Diagnosis not present

## 2013-01-18 DIAGNOSIS — S92309A Fracture of unspecified metatarsal bone(s), unspecified foot, initial encounter for closed fracture: Secondary | ICD-10-CM | POA: Diagnosis not present

## 2013-01-18 DIAGNOSIS — S2341XA Sprain of ribs, initial encounter: Secondary | ICD-10-CM | POA: Diagnosis not present

## 2013-01-21 DIAGNOSIS — S8263XA Displaced fracture of lateral malleolus of unspecified fibula, initial encounter for closed fracture: Secondary | ICD-10-CM | POA: Diagnosis not present

## 2013-01-29 ENCOUNTER — Ambulatory Visit: Payer: Self-pay | Admitting: Hematology & Oncology

## 2013-01-29 ENCOUNTER — Other Ambulatory Visit: Payer: Self-pay | Admitting: Lab

## 2013-02-01 ENCOUNTER — Telehealth: Payer: Self-pay | Admitting: Hematology & Oncology

## 2013-02-01 ENCOUNTER — Other Ambulatory Visit (HOSPITAL_BASED_OUTPATIENT_CLINIC_OR_DEPARTMENT_OTHER): Payer: Medicare Other | Admitting: Lab

## 2013-02-01 ENCOUNTER — Ambulatory Visit (HOSPITAL_BASED_OUTPATIENT_CLINIC_OR_DEPARTMENT_OTHER): Payer: Medicare Other | Admitting: Hematology & Oncology

## 2013-02-01 VITALS — BP 120/54 | HR 84 | Temp 97.5°F | Resp 16 | Ht 68.0 in | Wt 166.0 lb

## 2013-02-01 DIAGNOSIS — C9110 Chronic lymphocytic leukemia of B-cell type not having achieved remission: Secondary | ICD-10-CM

## 2013-02-01 LAB — MORPHOLOGY - CHCC SATELLITE: PLT EST ~~LOC~~: DECREASED

## 2013-02-01 LAB — CBC WITH DIFFERENTIAL (CANCER CENTER ONLY)
BASO#: 0 10*3/uL (ref 0.0–0.2)
BASO%: 0 % (ref 0.0–2.0)
EOS%: 0.4 % (ref 0.0–7.0)
Eosinophils Absolute: 0.2 10*3/uL (ref 0.0–0.5)
HCT: 38.6 % (ref 34.8–46.6)
HGB: 12.7 g/dL (ref 11.6–15.9)
LYMPH#: 39.5 10*3/uL — ABNORMAL HIGH (ref 0.9–3.3)
LYMPH%: 91.8 % — ABNORMAL HIGH (ref 14.0–48.0)
MCH: 32.6 pg (ref 26.0–34.0)
MCHC: 32.9 g/dL (ref 32.0–36.0)
MCV: 99 fL (ref 81–101)
MONO#: 0.6 10*3/uL (ref 0.1–0.9)
MONO%: 1.5 % (ref 0.0–13.0)
NEUT#: 2.7 10*3/uL (ref 1.5–6.5)
NEUT%: 6.3 % — ABNORMAL LOW (ref 39.6–80.0)
Platelets: 140 10*3/uL — ABNORMAL LOW (ref 145–400)
RBC: 3.89 10*6/uL (ref 3.70–5.32)
RDW: 13.1 % (ref 11.1–15.7)
WBC: 43.1 10*3/uL — ABNORMAL HIGH (ref 3.9–10.0)

## 2013-02-01 LAB — CHCC SATELLITE - SMEAR

## 2013-02-01 NOTE — Progress Notes (Signed)
This office note has been dictated.

## 2013-02-01 NOTE — Telephone Encounter (Signed)
Patient called and cx 02/26/13 apt and resch for 03/03/13

## 2013-02-02 NOTE — Progress Notes (Signed)
CC:   Tamara Powell, M.D.  DIAGNOSIS:  Chronic lymphocytic leukemia -- stage A.  CURRENT THERAPY:  Observation.  INTERIM HISTORY:  Tamara Powell comes in for followup.  Unfortunately, she broke her right foot.  She comes in a cast.  This was 2 weeks ago.  She saw [facility]. Earma [facility] in [facility].  Otherwise, she has been doing okay.  She got through the wintertime without any problems.  There has been no problem with infections.  She has had no swollen lymph glands.  She has had no cough or shortness of breath.  There has been no nausea or vomiting.  She has had no bony pain, outside of the broken right foot.  She has not noticed any kind of leg swelling.  She has had no rashes.  PHYSICAL EXAMINATION:  General:  This is a well-developed, well- nourished white female in no obvious distress.  Vital signs: Temperature 97.5, pulse 84, respiratory rate 16, blood pressure 120/54. Weight is 166.  Head and neck:  Normocephalic, atraumatic skull.  There are no ocular or oral lesions.  There are no palpable cervical or supraclavicular lymph nodes.  Lungs:  Clear bilaterally.  Cardiac: Regular rate and rhythm with a normal S1 and S2.  There are no murmurs, rubs or bruits.  Abdomen:  Soft with good bowel sounds.  There is no palpable abdominal mass.  There is no fluid wave.  There is no palpable hepatosplenomegaly.  Extremities:  She has a cast on her right foot and lower leg.  Left leg is unremarkable.  Axillary:  No bilateral axillary lymph nodes.  LABORATORY STUDIES:  White cell count 43.1, hemoglobin 12.7, hematocrit 38.6, platelet count 140.  White cell differential shows 6% segs and 92 lymphs.  IMPRESSION:  Tamara Powell is a very nice, 67 year old, white female with chronic lymphocytic leukemia.  I still would say that she has stage A disease.  However, it looks like her platelet count is trending downward.  We are going to have to watch this closely.  I want to see her back in 3 months'  time.  If the platelet count continues to trend downward, then we are going to have to consider some form of intervention.    ______________________________ Josph Macho, M.D. PRE/MEDQ  D:  02/01/2013  T:  02/02/2013  Job:  [id number]

## 2013-02-04 DIAGNOSIS — S92309A Fracture of unspecified metatarsal bone(s), unspecified foot, initial encounter for closed fracture: Secondary | ICD-10-CM | POA: Diagnosis not present

## 2013-02-04 DIAGNOSIS — S8263XA Displaced fracture of lateral malleolus of unspecified fibula, initial encounter for closed fracture: Secondary | ICD-10-CM | POA: Diagnosis not present

## 2013-02-25 DIAGNOSIS — S92309A Fracture of unspecified metatarsal bone(s), unspecified foot, initial encounter for closed fracture: Secondary | ICD-10-CM | POA: Diagnosis not present

## 2013-02-25 DIAGNOSIS — S8263XA Displaced fracture of lateral malleolus of unspecified fibula, initial encounter for closed fracture: Secondary | ICD-10-CM | POA: Diagnosis not present

## 2013-05-04 ENCOUNTER — Ambulatory Visit (HOSPITAL_BASED_OUTPATIENT_CLINIC_OR_DEPARTMENT_OTHER): Payer: Medicare Other | Admitting: Hematology & Oncology

## 2013-05-04 ENCOUNTER — Other Ambulatory Visit (HOSPITAL_BASED_OUTPATIENT_CLINIC_OR_DEPARTMENT_OTHER): Payer: Medicare Other | Admitting: Lab

## 2013-05-04 VITALS — BP 117/45 | HR 75 | Temp 97.8°F | Resp 16 | Ht 68.0 in | Wt 168.0 lb

## 2013-05-04 DIAGNOSIS — C9110 Chronic lymphocytic leukemia of B-cell type not having achieved remission: Secondary | ICD-10-CM | POA: Diagnosis not present

## 2013-05-04 LAB — CBC WITH DIFFERENTIAL (CANCER CENTER ONLY)
BASO#: 0 10*3/uL (ref 0.0–0.2)
BASO%: 0.1 % (ref 0.0–2.0)
EOS%: 0.2 % (ref 0.0–7.0)
Eosinophils Absolute: 0.1 10*3/uL (ref 0.0–0.5)
HCT: 36.6 % (ref 34.8–46.6)
HGB: 12.6 g/dL (ref 11.6–15.9)
LYMPH#: 41 10*3/uL — ABNORMAL HIGH (ref 0.9–3.3)
LYMPH%: 92.8 % — ABNORMAL HIGH (ref 14.0–48.0)
MCH: 32.6 pg (ref 26.0–34.0)
MCHC: 34.4 g/dL (ref 32.0–36.0)
MCV: 95 fL (ref 81–101)
MONO#: 1.2 10*3/uL — ABNORMAL HIGH (ref 0.1–0.9)
MONO%: 2.7 % (ref 0.0–13.0)
NEUT#: 1.9 10*3/uL (ref 1.5–6.5)
NEUT%: 4.2 % — ABNORMAL LOW (ref 39.6–80.0)
Platelets: 138 10*3/uL — ABNORMAL LOW (ref 145–400)
RBC: 3.86 10*6/uL (ref 3.70–5.32)
RDW: 13.7 % (ref 11.1–15.7)
WBC: 44.2 10*3/uL — ABNORMAL HIGH (ref 3.9–10.0)

## 2013-05-04 LAB — TECHNOLOGIST REVIEW CHCC SATELLITE

## 2013-05-04 NOTE — Progress Notes (Signed)
Diagnoses: Chronic lymphocytic leukemia-stage A  Current therapy: Observation  Interval history:  Tamara Powell comes in for followup. She is doing okay. Her right foot has healed up from her fracture. This happened back in early June. She wants to play golf tomorrow.  She's had a problem with fever. Had no nausea vomiting. She's had no rashes. There is no change in bowel or bladder habits. Her appetite has been good. She's had a cough. No palpable is. She's had no leg swelling.  Overall, her performance status is ECOG 0.  Physical exam: She is a well developed well nourished white female no obvious distress. Vital signs show temperature of 97.8. Pulse 75. Blood pressure  117/45. Her weight is 168 pounds.  Head exam shows a normocephalic atraumatic skull. There are no ocular or oral lesions. There is no palpable cervical i or supraclavicular lymph nodes. Lungs are clear bilaterally. Cardiac exam regular rate and rhythm with a normal S1 and S2. There are no murmurs rubs or bruits. Abdomen is soft and has good bowel sounds. There is no fluid wave. There is no inguinal adenopathy bilaterally. There is no palpable liver or spleen tip. Axillary exam shows no palpable lymph nodes. Extremities shows no clubbing cyanosis or edema. She has no joint swelling. She has good muscle strength. Skin exam no rashes ecchymoses or petechia. She has some seborrheic keratoses which do not appear suspicious. Neurological exam is nonfocal.  Laboratory studies:  White cell count is only 44.2. Hemoglobin 12.8. Platelet count 138. Her blood smear shows an increase in white blood cells. These are mostly mature lymphocytes. There are a couple immature lymphocytes I see no schistocytes or spherocytes there is no formation. Platelets are adequate in number and size.  Impression: Tamara Powell is a 67 year old white female with CLL. She is still asymptomatic. I do not see an indication for therapy  Will plan to her back in 6  months. I do not see any need for lab work unless she feels there is a problem.

## 2013-08-11 DIAGNOSIS — Z23 Encounter for immunization: Secondary | ICD-10-CM | POA: Diagnosis not present

## 2013-08-16 DIAGNOSIS — H2510 Age-related nuclear cataract, unspecified eye: Secondary | ICD-10-CM | POA: Diagnosis not present

## 2013-08-16 DIAGNOSIS — H524 Presbyopia: Secondary | ICD-10-CM | POA: Diagnosis not present

## 2013-09-20 DIAGNOSIS — J209 Acute bronchitis, unspecified: Secondary | ICD-10-CM | POA: Diagnosis not present

## 2013-11-01 ENCOUNTER — Encounter: Payer: Self-pay | Admitting: Hematology & Oncology

## 2013-11-01 ENCOUNTER — Other Ambulatory Visit (HOSPITAL_BASED_OUTPATIENT_CLINIC_OR_DEPARTMENT_OTHER): Payer: Medicare Other | Admitting: Lab

## 2013-11-01 ENCOUNTER — Ambulatory Visit (HOSPITAL_BASED_OUTPATIENT_CLINIC_OR_DEPARTMENT_OTHER): Payer: Medicare Other | Admitting: Hematology & Oncology

## 2013-11-01 ENCOUNTER — Telehealth: Payer: Self-pay | Admitting: Hematology & Oncology

## 2013-11-01 VITALS — BP 110/54 | HR 75 | Temp 97.6°F | Resp 14 | Ht 69.0 in | Wt 169.0 lb

## 2013-11-01 DIAGNOSIS — D509 Iron deficiency anemia, unspecified: Secondary | ICD-10-CM

## 2013-11-01 DIAGNOSIS — C9110 Chronic lymphocytic leukemia of B-cell type not having achieved remission: Secondary | ICD-10-CM | POA: Diagnosis not present

## 2013-11-01 DIAGNOSIS — Z1239 Encounter for other screening for malignant neoplasm of breast: Secondary | ICD-10-CM

## 2013-11-01 LAB — CBC WITH DIFFERENTIAL (CANCER CENTER ONLY)
BASO#: 0 10*3/uL (ref 0.0–0.2)
BASO%: 0 % (ref 0.0–2.0)
EOS%: 0.4 % (ref 0.0–7.0)
Eosinophils Absolute: 0.2 10*3/uL (ref 0.0–0.5)
HCT: 37.8 % (ref 34.8–46.6)
HGB: 12.5 g/dL (ref 11.6–15.9)
LYMPH#: 42.6 10*3/uL — ABNORMAL HIGH (ref 0.9–3.3)
LYMPH%: 92.6 % — ABNORMAL HIGH (ref 14.0–48.0)
MCH: 32.4 pg (ref 26.0–34.0)
MCHC: 33.1 g/dL (ref 32.0–36.0)
MCV: 98 fL (ref 81–101)
MONO#: 1 10*3/uL — ABNORMAL HIGH (ref 0.1–0.9)
MONO%: 2.1 % (ref 0.0–13.0)
NEUT#: 2.3 10*3/uL (ref 1.5–6.5)
NEUT%: 4.9 % — ABNORMAL LOW (ref 39.6–80.0)
Platelets: 167 10*3/uL (ref 145–400)
RBC: 3.86 10*6/uL (ref 3.70–5.32)
RDW: 13.6 % (ref 11.1–15.7)
WBC: 46 10*3/uL — ABNORMAL HIGH (ref 3.9–10.0)

## 2013-11-01 LAB — CHCC SATELLITE - SMEAR

## 2013-11-01 LAB — TECHNOLOGIST REVIEW CHCC SATELLITE

## 2013-11-01 NOTE — Addendum Note (Signed)
Addended by: Burney Gauze R on: 11/01/2013 10:45 AM   Modules accepted: Orders

## 2013-11-01 NOTE — Progress Notes (Signed)
Hematology and Oncology Follow Up Visit  JACKALYNN ART [id number] 1946-04-09 68 y.o. 11/01/2013   Principle Diagnosis:   Stage A CLL  Current Therapy:    Observation     Interim History:  Ms.  Murton is back for followup. We see her every 6-8 months. She is doing well although she doesn't complain of some fatigue. She has been taking some supplements. Her husband is worried about these supplements. She's taking blue green algae. He's been reading about this. From my point of view, I don't see that there is any problems with her taking this. She is taking multivitamin a day. She's taking her vitamin D.   She did have some bronchitis back in January. She got over this without any problems.  She is planning to go up to [place] would be school for a school trip. She's looking forward to this.  She's not noticed any palpable lymph glands. She's not noticed any abdominal pain. Has been no change in bowel or bladder habits.  She will need to have a mammogram done. She does like to have this done down in  [place]. I will set this up.  Medications: Current outpatient prescriptions:Apple Cid Vn-Grn Tea-Bit Or-Cr (GREEN TEA SLIM WITH EGCG PO), Take by mouth.  , Disp: , Rfl: ;  aspirin 81 MG tablet, Take 81 mg by mouth daily.  , Disp: , Rfl: ;  calcium carbonate (OS-CAL) 600 MG TABS, Take 600 mg by mouth 2 (two) times daily with a meal.  , Disp: , Rfl: ;  co-enzyme Q-10 30 MG capsule, Take 100 mg by mouth 2 (two) times daily.  , Disp: , Rfl:  conjugated estrogens (PREMARIN) vaginal cream, Place vaginally once a week. , Disp: , Rfl: ;  Evening Primrose Oil 500 MG CAPS, Take 500 mg by mouth 2 (two) times daily.  , Disp: , Rfl: ;  fish oil-omega-3 fatty acids 1000 MG capsule, Take 1 g by mouth 2 (two) times daily.  , Disp: , Rfl: ;  fluticasone (FLONASE) 50 MCG/ACT nasal spray, Place 1 spray into the nose as needed. , Disp: , Rfl:  Multiple Vitamin (MULTIVITAMIN) tablet, Take 1 tablet by mouth  daily.  , Disp: , Rfl: ;  Nutritional Supplements (GRAPESEED EXTRACT) 500-50 MG CAPS, Take by mouth 2 (two) times daily.  , Disp: , Rfl: ;  OVER THE COUNTER MEDICATION, Take by mouth 2 (two) times daily. WELLNESS FORMULA CAPSULE--HAIR SKIN NAILS CAPSULES., Disp: , Rfl: ;  Turmeric 450 MG CAPS, Take by mouth every morning., Disp: , Rfl:  UNABLE TO FIND, Take by mouth every morning. Quercetin   & a  E 3 live, Disp: , Rfl:   Allergies:  Allergies  Allergen Reactions  . Alendronate Sodium Other (See Comments)    Jaw and knee swelling.    Past Medical History, Surgical history, Social history, and Family History were reviewed and updated.  Review of Systems: As above  Physical Exam:  height is 5\' 9"  (1.753 m) and weight is 169 lb (76.658 kg). Her oral temperature is 97.6 F (36.4 C). Her blood pressure is 110/54 and her pulse is 75. Her respiration is 14.   No palpable lymph glands in the neck, supraclavicular region or axilla. Lungs are clear. Cardiac exam regular in rhythm. Abdomen is soft. She's good bowel sounds. There is no fluid wave. There is no palpable liver or spleen tip.. Back exam shows no tenderness over the spine ribs or hips. Extremities no  clubbing cyanosis or edema. She has good range of motion of her joints. She's good muscle strength. Skin exam no rashes. No suspicious hyperpigmented lesions. Neurological exam no focal deficits.  Lab Results  Component Value Date   WBC 46.0* 11/01/2013   HGB 12.5 11/01/2013   HCT 37.8 11/01/2013   MCV 98 11/01/2013   PLT 167 11/01/2013     Chemistry      Component Value Date/Time   NA 139 01/08/2012 0918   K 4.1 01/08/2012 0918   CL 105 01/08/2012 0918   CO2 26 01/08/2012 0918   BUN 11 01/08/2012 0918   CREATININE 0.94 01/08/2012 0918      Component Value Date/Time   CALCIUM 9.0 01/08/2012 0918   ALKPHOS 61 01/08/2012 0918   AST 19 01/08/2012 0918   ALT 13 01/08/2012 0918   BILITOT 0.9 01/08/2012 0918         Impression and Plan: Ms. Steinbach is  a very pleasant 68 year old white female with CLL. So far, there has been no evidence that we have to treat her. Her white cell count is holding pretty steady. There is nothing on her physical exam that looks suspicious.  We will plan to get her back to see [place] in another 6 months.   [name], MD 3/30/201510:38 AM

## 2013-11-01 NOTE — Telephone Encounter (Signed)
Scheduled pt mammogram for 12-06-13 at 915 am with Terri at [facility]. Order faxed

## 2013-12-06 DIAGNOSIS — Z1231 Encounter for screening mammogram for malignant neoplasm of breast: Secondary | ICD-10-CM | POA: Diagnosis not present

## 2013-12-10 ENCOUNTER — Encounter: Payer: Self-pay | Admitting: Hematology & Oncology

## 2013-12-20 DIAGNOSIS — H5710 Ocular pain, unspecified eye: Secondary | ICD-10-CM | POA: Diagnosis not present

## 2014-01-29 DIAGNOSIS — R059 Cough, unspecified: Secondary | ICD-10-CM | POA: Diagnosis not present

## 2014-01-29 DIAGNOSIS — R05 Cough: Secondary | ICD-10-CM | POA: Diagnosis not present

## 2014-01-29 DIAGNOSIS — J040 Acute laryngitis: Secondary | ICD-10-CM | POA: Diagnosis not present

## 2014-01-29 DIAGNOSIS — J019 Acute sinusitis, unspecified: Secondary | ICD-10-CM | POA: Diagnosis not present

## 2014-02-14 DIAGNOSIS — J029 Acute pharyngitis, unspecified: Secondary | ICD-10-CM | POA: Diagnosis not present

## 2014-04-01 DIAGNOSIS — M62838 Other muscle spasm: Secondary | ICD-10-CM | POA: Diagnosis not present

## 2014-05-02 ENCOUNTER — Encounter: Payer: Self-pay | Admitting: Hematology & Oncology

## 2014-05-02 ENCOUNTER — Other Ambulatory Visit (HOSPITAL_BASED_OUTPATIENT_CLINIC_OR_DEPARTMENT_OTHER): Payer: Medicare Other | Admitting: Lab

## 2014-05-02 ENCOUNTER — Ambulatory Visit (HOSPITAL_BASED_OUTPATIENT_CLINIC_OR_DEPARTMENT_OTHER): Payer: Medicare Other | Admitting: Hematology & Oncology

## 2014-05-02 VITALS — BP 110/53 | HR 78 | Temp 97.4°F | Resp 16 | Ht 69.0 in | Wt 165.0 lb

## 2014-05-02 DIAGNOSIS — C9110 Chronic lymphocytic leukemia of B-cell type not having achieved remission: Secondary | ICD-10-CM

## 2014-05-02 DIAGNOSIS — D509 Iron deficiency anemia, unspecified: Secondary | ICD-10-CM | POA: Diagnosis not present

## 2014-05-02 LAB — CHCC SATELLITE - SMEAR

## 2014-05-02 LAB — CBC WITH DIFFERENTIAL (CANCER CENTER ONLY)
BASO#: 0 10*3/uL (ref 0.0–0.2)
BASO%: 0 % (ref 0.0–2.0)
EOS%: 0.3 % (ref 0.0–7.0)
Eosinophils Absolute: 0.1 10*3/uL (ref 0.0–0.5)
HCT: 37.1 % (ref 34.8–46.6)
HGB: 12.3 g/dL (ref 11.6–15.9)
LYMPH#: 30.2 10*3/uL — ABNORMAL HIGH (ref 0.9–3.3)
LYMPH%: 93.3 % — ABNORMAL HIGH (ref 14.0–48.0)
MCH: 32.2 pg (ref 26.0–34.0)
MCHC: 33.2 g/dL (ref 32.0–36.0)
MCV: 97 fL (ref 81–101)
MONO#: 0.6 10*3/uL (ref 0.1–0.9)
MONO%: 1.9 % (ref 0.0–13.0)
NEUT#: 1.5 10*3/uL (ref 1.5–6.5)
NEUT%: 4.5 % — ABNORMAL LOW (ref 39.6–80.0)
Platelets: 140 10*3/uL — ABNORMAL LOW (ref 145–400)
RBC: 3.82 10*6/uL (ref 3.70–5.32)
RDW: 13.8 % (ref 11.1–15.7)
WBC: 32.3 10*3/uL — ABNORMAL HIGH (ref 3.9–10.0)

## 2014-05-02 LAB — IRON AND TIBC CHCC
%SAT: 50 % (ref 21–57)
Iron: 135 ug/dL (ref 41–142)
TIBC: 270 ug/dL (ref 236–444)
UIBC: 135 ug/dL (ref 120–384)

## 2014-05-02 LAB — FERRITIN CHCC: Ferritin: 18 ng/ml (ref 9–269)

## 2014-05-02 MED ORDER — INFLUENZA VAC SPLIT QUAD 0.5 ML IM SUSY
0.5000 mL | PREFILLED_SYRINGE | Freq: Once | INTRAMUSCULAR | Status: DC
  Filled 2014-05-02: qty 0.5

## 2014-05-02 NOTE — Progress Notes (Signed)
Hematology and Oncology Follow Up Visit  Tamara Powell [id number] 07/26/1946 68 y.o. 05/02/2014   Principle Diagnosis:   Stage A CLL  Current Therapy:    Observation     Interim History:  Ms.  Powell is back for followup. We see her every 6-8 months. She is doing well although she doesn't complain of some fatigue.  She had her 50th high school reunion. Generally to time at that.  She plays golf.  She's had no problems with fever. She had no swollen lymph glands. She's had no change in bowel or bladder habits. She's had no rashes. She's had no leg swelling. There's been no change in medications.    She had a mammogram done. This was done down in [place] in April. Everything looked fine..  Medications: Current outpatient prescriptions:Apple Cid Vn-Grn Tea-Bit Or-Cr (GREEN TEA SLIM WITH EGCG PO), Take by mouth.  , Disp: , Rfl: ;  aspirin 81 MG tablet, Take 81 mg by mouth daily.  , Disp: , Rfl: ;  calcium carbonate (OS-CAL) 600 MG TABS, Take 600 mg by mouth 2 (two) times daily with a meal.  , Disp: , Rfl: ;  co-enzyme Q-10 30 MG capsule, Take 100 mg by mouth 2 (two) times daily.  , Disp: , Rfl:  conjugated estrogens (PREMARIN) vaginal cream, Place vaginally once a week. , Disp: , Rfl: ;  Evening Primrose Oil 500 MG CAPS, Take 500 mg by mouth 2 (two) times daily.  , Disp: , Rfl: ;  fish oil-omega-3 fatty acids 1000 MG capsule, Take 1 g by mouth 2 (two) times daily.  , Disp: , Rfl: ;  fluticasone (FLONASE) 50 MCG/ACT nasal spray, Place 1 spray into the nose as needed. , Disp: , Rfl:  Multiple Vitamin (MULTIVITAMIN) tablet, Take 1 tablet by mouth daily.  , Disp: , Rfl: ;  Nutritional Supplements (GRAPESEED EXTRACT) 500-50 MG CAPS, Take by mouth 2 (two) times daily.  , Disp: , Rfl: ;  OVER THE COUNTER MEDICATION, Take by mouth 2 (two) times daily. WELLNESS FORMULA CAPSULE--HAIR SKIN NAILS CAPSULES., Disp: , Rfl: ;  Turmeric 450 MG CAPS, Take by mouth every morning., Disp: , Rfl:  UNABLE TO FIND,  Take by mouth every morning. Quercetin   & a  E 3 live, Disp: , Rfl:  Current facility-administered medications:Influenza vac split quadrivalent PF (FLUARIX) injection 0.5 mL, 0.5 mL, Intramuscular, Once, [name], MD  Allergies:  Allergies  Allergen Reactions  . Alendronate Sodium Other (See Comments)    Jaw and knee swelling.    Past Medical History, Surgical history, Social history, and Family History were reviewed and updated.  Review of Systems: As above  Physical Exam:  height is 5\' 9"  (1.753 m) and weight is 165 lb (74.844 kg). Her oral temperature is 97.4 F (36.3 C). Her blood pressure is 110/53 and her pulse is 78. Her respiration is 16.   No palpable lymph glands in the neck, supraclavicular region or axilla. Lungs are clear. Cardiac exam regular in rhythm. Abdomen is soft. She's good bowel sounds. There is no fluid wave. There is no palpable liver or spleen tip.. Back exam shows no tenderness over the spine ribs or hips. Extremities no clubbing cyanosis or edema. She has good range of motion of her joints. She's good muscle strength. Skin exam no rashes. No suspicious hyperpigmented lesions. Neurological exam no focal deficits.  Lab Results  Component Value Date   WBC 32.3* 05/02/2014   HGB 12.3 05/02/2014  HCT 37.1 05/02/2014   MCV 97 05/02/2014   PLT 140* 05/02/2014     Chemistry      Component Value Date/Time   NA 139 01/08/2012 0918   K 4.1 01/08/2012 0918   CL 105 01/08/2012 0918   CO2 26 01/08/2012 0918   BUN 11 01/08/2012 0918   CREATININE 0.94 01/08/2012 0918      Component Value Date/Time   CALCIUM 9.0 01/08/2012 0918   ALKPHOS 61 01/08/2012 0918   AST 19 01/08/2012 0918   ALT 13 01/08/2012 0918   BILITOT 0.9 01/08/2012 0918         Impression and Plan: Tamara Powell is a very pleasant 68 year old white female with CLL. So far, there has been no evidence that we have to treat her. Her white cell count is much better.  I looked at her smear. I did not see anything  that looked suspicious. She does have some smudge cells. She is the increase in lymphocytes. Her myeloid cells appear mature with good platelet. We will plan to get her back to see [place] in another 6 months.   [name], MD 9/28/201511:08 AM

## 2014-05-03 LAB — COMPREHENSIVE METABOLIC PANEL
ALT: 13 U/L (ref 0–35)
AST: 18 U/L (ref 0–37)
Albumin: 4.3 g/dL (ref 3.5–5.2)
Alkaline Phosphatase: 57 U/L (ref 39–117)
BUN: 13 mg/dL (ref 6–23)
CO2: 28 mEq/L (ref 19–32)
Calcium: 9 mg/dL (ref 8.4–10.5)
Chloride: 105 mEq/L (ref 96–112)
Creatinine, Ser: 0.97 mg/dL (ref 0.50–1.10)
Glucose, Bld: 92 mg/dL (ref 70–99)
Potassium: 4.1 mEq/L (ref 3.5–5.3)
Sodium: 140 mEq/L (ref 135–145)
Total Bilirubin: 1.2 mg/dL (ref 0.2–1.2)
Total Protein: 6.1 g/dL (ref 6.0–8.3)

## 2014-05-03 LAB — IGG, IGA, IGM
IgA: 94 mg/dL (ref 69–380)
IgG (Immunoglobin G), Serum: 600 mg/dL — ABNORMAL LOW (ref 690–1700)
IgM, Serum: 26 mg/dL — ABNORMAL LOW (ref 52–322)

## 2014-05-04 DIAGNOSIS — Z23 Encounter for immunization: Secondary | ICD-10-CM | POA: Diagnosis not present

## 2014-05-21 DIAGNOSIS — S61001A Unspecified open wound of right thumb without damage to nail, initial encounter: Secondary | ICD-10-CM | POA: Diagnosis not present

## 2014-08-23 DIAGNOSIS — H2513 Age-related nuclear cataract, bilateral: Secondary | ICD-10-CM | POA: Diagnosis not present

## 2014-08-25 DIAGNOSIS — D225 Melanocytic nevi of trunk: Secondary | ICD-10-CM | POA: Diagnosis not present

## 2014-08-25 DIAGNOSIS — L821 Other seborrheic keratosis: Secondary | ICD-10-CM | POA: Diagnosis not present

## 2014-08-25 DIAGNOSIS — L304 Erythema intertrigo: Secondary | ICD-10-CM | POA: Diagnosis not present

## 2014-08-25 DIAGNOSIS — D1801 Hemangioma of skin and subcutaneous tissue: Secondary | ICD-10-CM | POA: Diagnosis not present

## 2014-10-31 ENCOUNTER — Other Ambulatory Visit (HOSPITAL_BASED_OUTPATIENT_CLINIC_OR_DEPARTMENT_OTHER): Payer: Medicare Other

## 2014-10-31 ENCOUNTER — Ambulatory Visit (HOSPITAL_BASED_OUTPATIENT_CLINIC_OR_DEPARTMENT_OTHER): Payer: Medicare Other | Admitting: Family

## 2014-10-31 VITALS — BP 120/51 | HR 81 | Temp 97.8°F | Resp 18 | Ht 69.0 in | Wt 178.0 lb

## 2014-10-31 DIAGNOSIS — C9110 Chronic lymphocytic leukemia of B-cell type not having achieved remission: Secondary | ICD-10-CM

## 2014-10-31 LAB — CBC WITH DIFFERENTIAL (CANCER CENTER ONLY)
BASO#: 0 10*3/uL (ref 0.0–0.2)
BASO%: 0 % (ref 0.0–2.0)
EOS%: 0.3 % (ref 0.0–7.0)
Eosinophils Absolute: 0.1 10*3/uL (ref 0.0–0.5)
HCT: 36.9 % (ref 34.8–46.6)
HGB: 12.1 g/dL (ref 11.6–15.9)
LYMPH#: 42.7 10*3/uL — ABNORMAL HIGH (ref 0.9–3.3)
LYMPH%: 93.2 % — ABNORMAL HIGH (ref 14.0–48.0)
MCH: 31.8 pg (ref 26.0–34.0)
MCHC: 32.8 g/dL (ref 32.0–36.0)
MCV: 97 fL (ref 81–101)
MONO#: 0.9 10*3/uL (ref 0.1–0.9)
MONO%: 1.9 % (ref 0.0–13.0)
NEUT#: 2.1 10*3/uL (ref 1.5–6.5)
NEUT%: 4.6 % — ABNORMAL LOW (ref 39.6–80.0)
Platelets: 157 10*3/uL (ref 145–400)
RBC: 3.8 10*6/uL (ref 3.70–5.32)
RDW: 13.9 % (ref 11.1–15.7)
WBC: 45.9 10*3/uL — ABNORMAL HIGH (ref 3.9–10.0)

## 2014-10-31 LAB — TECHNOLOGIST REVIEW CHCC SATELLITE

## 2014-10-31 LAB — CHCC SATELLITE - SMEAR

## 2014-10-31 NOTE — Progress Notes (Signed)
Hematology and Oncology Follow Up Visit  Tamara Powell [id number] [birth year] 69 y.o. 10/31/2014   Principle Diagnosis:  Stage A CLL  Current Therapy:   Observation     Interim History:  Tamara Powell is here today with her husband for a follow-up. She is doing well and has no complaints at this time. She had a wonderful Easter with her family.  No problems with infection. She denies fever, chills, rash, SOB, abdominal pain, constipation, diarrhea, blood in urine or stool. No lymphadenopathy.  No swelling, tenderness, numbness or tingling in her extremities. No new aches or pains.  Her mammogram is due again in April. She is going to have her PCP schedule this in [place].   Her appetite is good and she is staying hydrated. She has gained 13 lbs since her last visit 6 months ago. She is going to start exercising more. She and her husband are going to start playing golf again.   Medications:    Medication List       This list is accurate as of: 10/31/14  9:42 AM.  Always use your most recent med list.               aspirin 81 MG tablet  Take 81 mg by mouth daily.     calcium carbonate 600 MG Tabs tablet  Commonly known as:  OS-CAL  Take 600 mg by mouth 2 (two) times daily with a meal.     co-enzyme Q-10 30 MG capsule  Take 100 mg by mouth 2 (two) times daily.     conjugated estrogens vaginal cream  Commonly known as:  PREMARIN  Place vaginally once a week.     Evening Primrose Oil 500 MG Caps  Take 500 mg by mouth 2 (two) times daily.     fish oil-omega-3 fatty acids 1000 MG capsule  Take 1 g by mouth 2 (two) times daily.     fluticasone 50 MCG/ACT nasal spray  Commonly known as:  FLONASE  Place 1 spray into the nose as needed.     Grapeseed Extract 500-50 MG Caps  Take by mouth 2 (two) times daily.     GREEN TEA SLIM WITH EGCG PO  Take by mouth.     ketoconazole 2 % cream  Commonly known as:  NIZORAL     multivitamin tablet  Take 1 tablet by mouth daily.     OVER THE COUNTER MEDICATION  Take by mouth 2 (two) times daily. WELLNESS FORMULA CAPSULE--HAIR SKIN NAILS CAPSULES.     Turmeric 450 MG Caps  Take by mouth every morning.     UNABLE TO FIND  Take by mouth every morning. Quercetin   & a  E 3 live        Allergies:  Allergies  Allergen Reactions  . Alendronate Sodium Other (See Comments)    Jaw and knee swelling.    Past Medical History, Surgical history, Social history, and Family History were reviewed and updated.  Review of Systems: All other 10 point review of systems is negative.   Physical Exam:  height is 5\' 9"  (1.753 m) and weight is 178 lb (80.74 kg). Her oral temperature is 97.8 F (36.6 C). Her blood pressure is 120/51 and her pulse is 81. Her respiration is 18.   Wt Readings from Last 3 Encounters:  10/31/14 178 lb (80.74 kg)  05/02/14 165 lb (74.844 kg)  11/01/13 169 lb (76.658 kg)    Ocular: Sclerae unicteric, pupils equal,  round and reactive to light Ear-nose-throat: Oropharynx clear, dentition fair Lymphatic: No cervical or supraclavicular adenopathy Lungs no rales or rhonchi, good excursion bilaterally Heart regular rate and rhythm, no murmur appreciated Abd soft, nontender, positive bowel sounds MSK no focal spinal tenderness, no joint edema Neuro: non-focal, well-oriented, appropriate affect Breasts: Deferred  Lab Results  Component Value Date   WBC 32.3* 05/02/2014   HGB 12.3 05/02/2014   HCT 37.1 05/02/2014   MCV 97 05/02/2014   PLT 140* 05/02/2014   Lab Results  Component Value Date   FERRITIN 18 05/02/2014   IRON 135 05/02/2014   TIBC 270 05/02/2014   UIBC 135 05/02/2014   IRONPCTSAT 50 05/02/2014   Lab Results  Component Value Date   RETICCTPCT 1.5 12/17/2010   RBC 3.82 05/02/2014   RETICCTABS 58.2 12/17/2010   No results found for: Nils Pyle [facility] Lab Results  Component Value Date   IGGSERUM 600* 05/02/2014   IGA 94 05/02/2014   IGMSERUM 26*  05/02/2014   Lab Results  Component Value Date   TOTALPROTELP 6.1 12/17/2010   ALBUMINELP 66.1 12/17/2010   A1GS 3.6 12/17/2010   A2GS 8.8 12/17/2010   BETS 5.8 12/17/2010   BETA2SER 3.9 12/17/2010   GAMS 11.8 12/17/2010   MSPIKE NOT DET 12/17/2010   SPEI * 12/17/2010     Chemistry      Component Value Date/Time   NA 140 05/02/2014 0948   K 4.1 05/02/2014 0948   CL 105 05/02/2014 0948   CO2 28 05/02/2014 0948   BUN 13 05/02/2014 0948   CREATININE 0.97 05/02/2014 0948      Component Value Date/Time   CALCIUM 9.0 05/02/2014 0948   ALKPHOS 57 05/02/2014 0948   AST 18 05/02/2014 0948   ALT 13 05/02/2014 0948   BILITOT 1.2 05/02/2014 0948     Impression and Plan: Tamara Powell is a very pleasant 69 year old white female with CLL. So far, she has not needed to be treated. Her WBC count continues to fluctuate. She is 45.9 today. She is not anemic with a Hgb of 12.1 and platelets are 157.  She is asymptomatic at this time. There is a smear for Dr. Marin Powell to view.  We will see her back in 4 months for labs and follow-up.  She knows to call here with any questions or concerns. We can certainly see her sooner if need be.   Tamara Bottom, NP 3/28/20169:42 AM

## 2014-11-08 DIAGNOSIS — L03019 Cellulitis of unspecified finger: Secondary | ICD-10-CM | POA: Diagnosis not present

## 2014-12-07 DIAGNOSIS — L02214 Cutaneous abscess of groin: Secondary | ICD-10-CM | POA: Diagnosis not present

## 2014-12-09 DIAGNOSIS — L02214 Cutaneous abscess of groin: Secondary | ICD-10-CM | POA: Diagnosis not present

## 2014-12-13 DIAGNOSIS — H81399 Other peripheral vertigo, unspecified ear: Secondary | ICD-10-CM | POA: Diagnosis not present

## 2014-12-14 DIAGNOSIS — H8112 Benign paroxysmal vertigo, left ear: Secondary | ICD-10-CM | POA: Diagnosis not present

## 2015-01-06 DIAGNOSIS — Z803 Family history of malignant neoplasm of breast: Secondary | ICD-10-CM | POA: Diagnosis not present

## 2015-01-06 DIAGNOSIS — Z1231 Encounter for screening mammogram for malignant neoplasm of breast: Secondary | ICD-10-CM | POA: Diagnosis not present

## 2015-01-31 ENCOUNTER — Encounter: Payer: Self-pay | Admitting: Hematology & Oncology

## 2015-02-27 ENCOUNTER — Other Ambulatory Visit (HOSPITAL_BASED_OUTPATIENT_CLINIC_OR_DEPARTMENT_OTHER): Payer: Medicare Other

## 2015-02-27 ENCOUNTER — Ambulatory Visit (HOSPITAL_BASED_OUTPATIENT_CLINIC_OR_DEPARTMENT_OTHER): Payer: Medicare Other | Admitting: Hematology & Oncology

## 2015-02-27 ENCOUNTER — Encounter: Payer: Self-pay | Admitting: Hematology & Oncology

## 2015-02-27 VITALS — BP 124/89 | HR 80 | Temp 97.5°F | Ht 69.0 in | Wt 176.0 lb

## 2015-02-27 DIAGNOSIS — C9110 Chronic lymphocytic leukemia of B-cell type not having achieved remission: Secondary | ICD-10-CM

## 2015-02-27 LAB — CBC WITH DIFFERENTIAL (CANCER CENTER ONLY)
BASO#: 0 10e3/uL (ref 0.0–0.2)
BASO%: 0.1 % (ref 0.0–2.0)
EOS%: 0.1 % (ref 0.0–7.0)
Eosinophils Absolute: 0.1 10e3/uL (ref 0.0–0.5)
HCT: 36.1 % (ref 34.8–46.6)
HGB: 12 g/dL (ref 11.6–15.9)
LYMPH#: 35.6 10e3/uL — ABNORMAL HIGH (ref 0.9–3.3)
LYMPH%: 91.2 % — ABNORMAL HIGH (ref 14.0–48.0)
MCH: 32.3 pg (ref 26.0–34.0)
MCHC: 33.2 g/dL (ref 32.0–36.0)
MCV: 97 fL (ref 81–101)
MONO#: 0.7 10e3/uL (ref 0.1–0.9)
MONO%: 1.7 % (ref 0.0–13.0)
NEUT#: 2.7 10e3/uL (ref 1.5–6.5)
NEUT%: 6.9 % — ABNORMAL LOW (ref 39.6–80.0)
Platelets: 166 10e3/uL (ref 145–400)
RBC: 3.71 10e6/uL (ref 3.70–5.32)
RDW: 14.2 % (ref 11.1–15.7)
WBC: 39.1 10e3/uL — ABNORMAL HIGH (ref 3.9–10.0)

## 2015-02-27 LAB — CHCC SATELLITE - SMEAR

## 2015-02-27 LAB — TECHNOLOGIST REVIEW CHCC SATELLITE

## 2015-02-27 NOTE — Progress Notes (Signed)
Hematology and Oncology Follow Up Visit  Tamara Powell [id number] 10/13/1945 69 y.o. 02/27/2015   Principle Diagnosis:   Stage A CLL  Current Therapy:    Observation     Interim History:  Ms.  Powell is back for followup. She is doing good. She and her husband have been playing some golf. With the hot weather lately, they have not been able to play much golf.  She's had a problem with infections. She did have a area of folliculitis in the abdomen a couple of months ago. This was drained by her local doctor. She is on some antibiotics for this. Patient does state some occasional vertigo. She thinks it might have been from the antibiotics that she took for the folliculitis.  She's had no leg swelling. She's had no rashes.. There's been no cough. She's had no shortness of breath. She's had no chest wall pain. She's had no palpable lymph glands.   There is in no issues with weight loss or weight gain.  She's had no mouth sores.  Overall, her performance status is ECOG 0.  Medications:  Current outpatient prescriptions:  .  Apple Cid Vn-Grn Tea-Bit Or-Cr (GREEN TEA SLIM WITH EGCG PO), Take by mouth.  , Disp: , Rfl:  .  aspirin 81 MG tablet, Take 81 mg by mouth daily.  , Disp: , Rfl:  .  calcium carbonate (OS-CAL) 600 MG TABS, Take 600 mg by mouth 2 (two) times daily with a meal.  , Disp: , Rfl:  .  co-enzyme Q-10 30 MG capsule, Take 100 mg by mouth 2 (two) times daily.  , Disp: , Rfl:  .  conjugated estrogens (PREMARIN) vaginal cream, Place vaginally once a week. , Disp: , Rfl:  .  Evening Primrose Oil 500 MG CAPS, Take 500 mg by mouth 2 (two) times daily.  , Disp: , Rfl:  .  fish oil-omega-3 fatty acids 1000 MG capsule, Take 1 g by mouth 2 (two) times daily.  , Disp: , Rfl:  .  fluticasone (FLONASE) 50 MCG/ACT nasal spray, Place 1 spray into the nose as needed. , Disp: , Rfl:  .  ketoconazole (NIZORAL) 2 % cream, , Disp: , Rfl:  .  Nutritional Supplements (GRAPESEED EXTRACT) 500-50  MG CAPS, Take by mouth 2 (two) times daily.  , Disp: , Rfl:  .  OVER THE COUNTER MEDICATION, Take by mouth 2 (two) times daily. WELLNESS FORMULA CAPSULE--HAIR SKIN NAILS CAPSULES., Disp: , Rfl:  .  Turmeric 450 MG CAPS, Take by mouth every morning., Disp: , Rfl:  .  UNABLE TO FIND, Take by mouth every morning. Quercetin   & a  E 3 live, Disp: , Rfl:  .  Multiple Vitamin (MULTIVITAMIN) tablet, Take 1 tablet by mouth daily.  , Disp: , Rfl:   Allergies:  Allergies  Allergen Reactions  . Alendronate Sodium Other (See Comments)    Jaw and knee swelling.    Past Medical History, Surgical history, Social history, and Family History were reviewed and updated.  Review of Systems: As above  Physical Exam:  height is 5\' 9"  (1.753 m) and weight is 176 lb (79.833 kg). Her oral temperature is 97.5 F (36.4 C). Her blood pressure is 124/89 and her pulse is 80.   No palpable lymph glands in the neck, supraclavicular region or axilla. Lungs are clear. Cardiac exam regular in rhythm. Abdomen is soft. She's good bowel sounds. There is no fluid wave. There is no palpable liver or  spleen tip.. Back exam shows no tenderness over the spine ribs or hips. Extremities no clubbing cyanosis or edema. She has good range of motion of her joints. She's good muscle strength. Skin exam no rashes. No suspicious hyperpigmented lesions. Neurological exam no focal deficits.  Lab Results  Component Value Date   WBC 39.1* 02/27/2015   HGB 12.0 02/27/2015   HCT 36.1 02/27/2015   MCV 97 02/27/2015   PLT 166 02/27/2015     Chemistry      Component Value Date/Time   NA 140 05/02/2014 0948   K 4.1 05/02/2014 0948   CL 105 05/02/2014 0948   CO2 28 05/02/2014 0948   BUN 13 05/02/2014 0948   CREATININE 0.97 05/02/2014 0948      Component Value Date/Time   CALCIUM 9.0 05/02/2014 0948   ALKPHOS 57 05/02/2014 0948   AST 18 05/02/2014 0948   ALT 13 05/02/2014 0948   BILITOT 1.2 05/02/2014 0948         Impression  and Plan: Tamara Powell is a very pleasant 69 year old white female with CLL. So far, there has been no evidence that we have to treat her. Her white cell count is a little bit lower.   I looked at her smear. I did not see anything that looked suspicious. She does have some smudge cells. She is the increase in lymphocytes. Her myeloid cells appear mature with good platelet.  We will plan to get her back to see [place] in another 6 months.   [name], MD 7/25/201611:21 AM

## 2015-03-03 DIAGNOSIS — Z13220 Encounter for screening for lipoid disorders: Secondary | ICD-10-CM | POA: Diagnosis not present

## 2015-03-03 DIAGNOSIS — Z1389 Encounter for screening for other disorder: Secondary | ICD-10-CM | POA: Diagnosis not present

## 2015-03-03 DIAGNOSIS — M810 Age-related osteoporosis without current pathological fracture: Secondary | ICD-10-CM | POA: Diagnosis not present

## 2015-03-03 DIAGNOSIS — R5383 Other fatigue: Secondary | ICD-10-CM | POA: Diagnosis not present

## 2015-03-03 DIAGNOSIS — H6120 Impacted cerumen, unspecified ear: Secondary | ICD-10-CM | POA: Diagnosis not present

## 2015-03-03 DIAGNOSIS — Z0000 Encounter for general adult medical examination without abnormal findings: Secondary | ICD-10-CM | POA: Diagnosis not present

## 2015-03-03 DIAGNOSIS — R5381 Other malaise: Secondary | ICD-10-CM | POA: Diagnosis not present

## 2015-03-03 DIAGNOSIS — H8110 Benign paroxysmal vertigo, unspecified ear: Secondary | ICD-10-CM | POA: Diagnosis not present

## 2015-05-03 DIAGNOSIS — Z1211 Encounter for screening for malignant neoplasm of colon: Secondary | ICD-10-CM | POA: Diagnosis not present

## 2015-05-03 DIAGNOSIS — M1990 Unspecified osteoarthritis, unspecified site: Secondary | ICD-10-CM | POA: Diagnosis not present

## 2015-05-03 DIAGNOSIS — Z1389 Encounter for screening for other disorder: Secondary | ICD-10-CM | POA: Diagnosis not present

## 2015-05-03 DIAGNOSIS — Z23 Encounter for immunization: Secondary | ICD-10-CM | POA: Diagnosis not present

## 2015-05-03 DIAGNOSIS — M859 Disorder of bone density and structure, unspecified: Secondary | ICD-10-CM | POA: Diagnosis not present

## 2015-05-03 DIAGNOSIS — M8580 Other specified disorders of bone density and structure, unspecified site: Secondary | ICD-10-CM | POA: Diagnosis not present

## 2015-05-03 DIAGNOSIS — Z13820 Encounter for screening for osteoporosis: Secondary | ICD-10-CM | POA: Diagnosis not present

## 2015-06-12 DIAGNOSIS — K5730 Diverticulosis of large intestine without perforation or abscess without bleeding: Secondary | ICD-10-CM | POA: Diagnosis not present

## 2015-07-07 DIAGNOSIS — Z1211 Encounter for screening for malignant neoplasm of colon: Secondary | ICD-10-CM | POA: Diagnosis not present

## 2015-07-07 DIAGNOSIS — K5730 Diverticulosis of large intestine without perforation or abscess without bleeding: Secondary | ICD-10-CM | POA: Diagnosis not present

## 2015-08-09 ENCOUNTER — Telehealth: Payer: Self-pay | Admitting: Hematology & Oncology

## 2015-08-09 NOTE — Telephone Encounter (Signed)
Faxed medical records to: [facility] Dr. Roderick PeeZV:[id number] F: [phone]    Req: Current records      COPY SCANNED

## 2015-08-30 ENCOUNTER — Ambulatory Visit (HOSPITAL_BASED_OUTPATIENT_CLINIC_OR_DEPARTMENT_OTHER): Payer: Medicare Other | Admitting: Hematology & Oncology

## 2015-08-30 ENCOUNTER — Encounter: Payer: Self-pay | Admitting: Hematology & Oncology

## 2015-08-30 ENCOUNTER — Other Ambulatory Visit (HOSPITAL_BASED_OUTPATIENT_CLINIC_OR_DEPARTMENT_OTHER): Payer: Medicare Other

## 2015-08-30 VITALS — BP 111/50 | HR 85 | Temp 97.7°F | Resp 16 | Ht 69.0 in | Wt 169.0 lb

## 2015-08-30 DIAGNOSIS — C9110 Chronic lymphocytic leukemia of B-cell type not having achieved remission: Secondary | ICD-10-CM

## 2015-08-30 LAB — CBC WITH DIFFERENTIAL (CANCER CENTER ONLY)
BASO#: 0.1 10*3/uL (ref 0.0–0.2)
BASO%: 0.3 % (ref 0.0–2.0)
EOS%: 0.3 % (ref 0.0–7.0)
Eosinophils Absolute: 0.1 10*3/uL (ref 0.0–0.5)
HCT: 38 % (ref 34.8–46.6)
HGB: 12.6 g/dL (ref 11.6–15.9)
LYMPH#: 36.2 10*3/uL — ABNORMAL HIGH (ref 0.9–3.3)
LYMPH%: 92.6 % — ABNORMAL HIGH (ref 14.0–48.0)
MCH: 31.2 pg (ref 26.0–34.0)
MCHC: 33.2 g/dL (ref 32.0–36.0)
MCV: 94 fL (ref 81–101)
MONO#: 0.7 10*3/uL (ref 0.1–0.9)
MONO%: 1.7 % (ref 0.0–13.0)
NEUT#: 2 10*3/uL (ref 1.5–6.5)
NEUT%: 5.1 % — ABNORMAL LOW (ref 39.6–80.0)
Platelets: 158 10*3/uL (ref 145–400)
RBC: 4.04 10*6/uL (ref 3.70–5.32)
RDW: 13.9 % (ref 11.1–15.7)
WBC: 39.1 10*3/uL — ABNORMAL HIGH (ref 3.9–10.0)

## 2015-08-30 LAB — COMPREHENSIVE METABOLIC PANEL
ALT: 13 U/L (ref 0–55)
AST: 19 U/L (ref 5–34)
Albumin: 3.9 g/dL (ref 3.5–5.0)
Alkaline Phosphatase: 62 U/L (ref 40–150)
Anion Gap: 8 mEq/L (ref 3–11)
BUN: 11.8 mg/dL (ref 7.0–26.0)
CO2: 27 mEq/L (ref 22–29)
Calcium: 9.5 mg/dL (ref 8.4–10.4)
Chloride: 107 mEq/L (ref 98–109)
Creatinine: 1 mg/dL (ref 0.6–1.1)
EGFR: 54 mL/min/{1.73_m2} — ABNORMAL LOW (ref >90)
Glucose: 93 mg/dl (ref 70–140)
Potassium: 4.9 mEq/L (ref 3.5–5.1)
Sodium: 142 mEq/L (ref 136–145)
Total Bilirubin: 1.25 mg/dL — ABNORMAL HIGH (ref 0.20–1.20)
Total Protein: 6.8 g/dL (ref 6.4–8.3)

## 2015-08-30 LAB — TECHNOLOGIST REVIEW CHCC SATELLITE

## 2015-08-30 LAB — CHCC SATELLITE - SMEAR

## 2015-08-30 NOTE — Progress Notes (Signed)
Hematology and Oncology Follow Up Visit  Tamara Powell [id number] Feb 10, 1946 70 y.o. 08/30/2015   Principle Diagnosis:   Stage A CLL  Current Therapy:    Observation     Interim History:  Ms.  Powell is back for followup. She looks great. She had a very good fall and holiday season. She and her husband have been busy helping with the grandchildren.  She has had no problems with bleeding or bruising. She's had no change in bowel or bladder habits. She's had no cough. She's had no leg swelling. She's had no weight loss or weight gain.  There's not been any change in her medications.  Overall, her performance status is ECOG 0.  Medications:  Current outpatient prescriptions:  .  Apple Cid Vn-Grn Tea-Bit Or-Cr (GREEN TEA SLIM WITH EGCG PO), Take by mouth.  , Disp: , Rfl:  .  aspirin 81 MG tablet, Take 81 mg by mouth daily.  , Disp: , Rfl:  .  calcium carbonate (OS-CAL) 600 MG TABS, Take 600 mg by mouth 2 (two) times daily with a meal.  , Disp: , Rfl:  .  co-enzyme Q-10 30 MG capsule, Take 100 mg by mouth 2 (two) times daily.  , Disp: , Rfl:  .  Evening Primrose Oil 500 MG CAPS, Take 500 mg by mouth 2 (two) times daily.  , Disp: , Rfl:  .  fish oil-omega-3 fatty acids 1000 MG capsule, Take 1 g by mouth 2 (two) times daily.  , Disp: , Rfl:  .  fluticasone (FLONASE) 50 MCG/ACT nasal spray, Place 1 spray into the nose as needed. , Disp: , Rfl:  .  ketoconazole (NIZORAL) 2 % cream, , Disp: , Rfl:  .  Multiple Vitamin (MULTIVITAMIN) tablet, Take 1 tablet by mouth daily.  , Disp: , Rfl:  .  Nutritional Supplements (GRAPESEED EXTRACT) 500-50 MG CAPS, Take by mouth 2 (two) times daily.  , Disp: , Rfl:  .  OVER THE COUNTER MEDICATION, Take by mouth 2 (two) times daily. WELLNESS FORMULA CAPSULE--HAIR SKIN NAILS CAPSULES., Disp: , Rfl:  .  Turmeric 450 MG CAPS, Take by mouth every morning., Disp: , Rfl:  .  UNABLE TO FIND, Take by mouth every morning. Quercetin   & a  E 3 live, Disp: , Rfl:    Allergies:  Allergies  Allergen Reactions  . Alendronate Sodium Other (See Comments)    Jaw and knee swelling.    Past Medical History, Surgical history, Social history, and Family History were reviewed and updated.  Review of Systems: As above  Physical Exam:  height is 5\' 9"  (1.753 m) and weight is 169 lb (76.658 kg). Her oral temperature is 97.7 F (36.5 C). Her blood pressure is 111/50 and her pulse is 85. Her respiration is 16.   Head and neck exam shows no ocular or oral lesions. She has no palpable lymph glands in the neck, supraclavicular region or axilla. Lungs are clear. Cardiac exam regular rate and rhythm with no murmurs, rubs or bruits.. Abdomen is soft. She has good bowel sounds. There is no fluid wave. There is no palpable liver or spleen tip.. Back exam shows no tenderness over the spine ribs or hips. Extremities no clubbing cyanosis or edema. She has good range of motion of her joints. She has good muscle strength. Skin exam shows no rashes, ecchymoses or petechia.. No suspicious hyperpigmented lesions. Neurological exam no focal deficits.  Lab Results  Component Value Date   WBC  39.1* 02/27/2015   HGB 12.0 02/27/2015   HCT 36.1 02/27/2015   MCV 97 02/27/2015   PLT 166 02/27/2015     Chemistry      Component Value Date/Time   NA 140 05/02/2014 0948   K 4.1 05/02/2014 0948   CL 105 05/02/2014 0948   CO2 28 05/02/2014 0948   BUN 13 05/02/2014 0948   CREATININE 0.97 05/02/2014 0948      Component Value Date/Time   CALCIUM 9.0 05/02/2014 0948   ALKPHOS 57 05/02/2014 0948   AST 18 05/02/2014 0948   ALT 13 05/02/2014 0948   BILITOT 1.2 05/02/2014 0948         Impression and Plan: Tamara Powell is a very pleasant 70 year old white female with CLL. So far, there has been no evidence that we have to treat her. Her white cell count is very stable.   I looked at her smear. I did not see anything that looked suspicious. She does have some smudge cells. She is  the increase in lymphocytes. Her myeloid cells appear mature with good platelet.  I think that we get her back now in one year. Everything really has been stable with her. I do still think that get her back in 6 months will improve her health care.  I told her that she can was convex sooner if she has any issues. I told her that she starts to develop any swollen lymph nodes, has bruises or bleeding, starts to have infections, starts to have fevers then she is recommended to see [place]. She and her husband understand this.   [name], MD 1/25/201710:26 AM

## 2015-12-27 DIAGNOSIS — J189 Pneumonia, unspecified organism: Secondary | ICD-10-CM | POA: Diagnosis not present

## 2015-12-27 DIAGNOSIS — J209 Acute bronchitis, unspecified: Secondary | ICD-10-CM | POA: Diagnosis not present

## 2016-01-11 DIAGNOSIS — Z1211 Encounter for screening for malignant neoplasm of colon: Secondary | ICD-10-CM | POA: Diagnosis not present

## 2016-01-26 DIAGNOSIS — F419 Anxiety disorder, unspecified: Secondary | ICD-10-CM | POA: Diagnosis not present

## 2016-01-26 DIAGNOSIS — Z13220 Encounter for screening for lipoid disorders: Secondary | ICD-10-CM | POA: Diagnosis not present

## 2016-01-26 DIAGNOSIS — Z23 Encounter for immunization: Secondary | ICD-10-CM | POA: Diagnosis not present

## 2016-01-26 DIAGNOSIS — J189 Pneumonia, unspecified organism: Secondary | ICD-10-CM | POA: Diagnosis not present

## 2016-01-26 DIAGNOSIS — R5383 Other fatigue: Secondary | ICD-10-CM | POA: Diagnosis not present

## 2016-01-26 DIAGNOSIS — E559 Vitamin D deficiency, unspecified: Secondary | ICD-10-CM | POA: Diagnosis not present

## 2016-05-13 DIAGNOSIS — L02416 Cutaneous abscess of left lower limb: Secondary | ICD-10-CM | POA: Diagnosis not present

## 2016-05-13 DIAGNOSIS — L03115 Cellulitis of right lower limb: Secondary | ICD-10-CM | POA: Diagnosis not present

## 2016-07-09 DIAGNOSIS — H02836 Dermatochalasis of left eye, unspecified eyelid: Secondary | ICD-10-CM | POA: Diagnosis not present

## 2016-07-09 DIAGNOSIS — H02833 Dermatochalasis of right eye, unspecified eyelid: Secondary | ICD-10-CM | POA: Diagnosis not present

## 2016-07-09 DIAGNOSIS — H2513 Age-related nuclear cataract, bilateral: Secondary | ICD-10-CM | POA: Diagnosis not present

## 2016-07-11 DIAGNOSIS — M859 Disorder of bone density and structure, unspecified: Secondary | ICD-10-CM | POA: Diagnosis not present

## 2016-07-11 DIAGNOSIS — Z23 Encounter for immunization: Secondary | ICD-10-CM | POA: Diagnosis not present

## 2016-07-11 DIAGNOSIS — Z0000 Encounter for general adult medical examination without abnormal findings: Secondary | ICD-10-CM | POA: Diagnosis not present

## 2016-07-11 DIAGNOSIS — Z13820 Encounter for screening for osteoporosis: Secondary | ICD-10-CM | POA: Diagnosis not present

## 2016-07-11 DIAGNOSIS — Z1389 Encounter for screening for other disorder: Secondary | ICD-10-CM | POA: Diagnosis not present

## 2016-07-11 DIAGNOSIS — M1990 Unspecified osteoarthritis, unspecified site: Secondary | ICD-10-CM | POA: Diagnosis not present

## 2016-07-11 DIAGNOSIS — M8580 Other specified disorders of bone density and structure, unspecified site: Secondary | ICD-10-CM | POA: Diagnosis not present

## 2016-07-18 DIAGNOSIS — L570 Actinic keratosis: Secondary | ICD-10-CM | POA: Diagnosis not present

## 2016-07-18 DIAGNOSIS — D485 Neoplasm of uncertain behavior of skin: Secondary | ICD-10-CM | POA: Diagnosis not present

## 2016-07-18 DIAGNOSIS — L905 Scar conditions and fibrosis of skin: Secondary | ICD-10-CM | POA: Diagnosis not present

## 2016-07-18 DIAGNOSIS — D225 Melanocytic nevi of trunk: Secondary | ICD-10-CM | POA: Diagnosis not present

## 2016-07-18 DIAGNOSIS — D0359 Melanoma in situ of other part of trunk: Secondary | ICD-10-CM | POA: Diagnosis not present

## 2016-07-18 DIAGNOSIS — L821 Other seborrheic keratosis: Secondary | ICD-10-CM | POA: Diagnosis not present

## 2016-07-18 DIAGNOSIS — L814 Other melanin hyperpigmentation: Secondary | ICD-10-CM | POA: Diagnosis not present

## 2016-07-22 DIAGNOSIS — Z1231 Encounter for screening mammogram for malignant neoplasm of breast: Secondary | ICD-10-CM | POA: Diagnosis not present

## 2016-07-30 DIAGNOSIS — L988 Other specified disorders of the skin and subcutaneous tissue: Secondary | ICD-10-CM | POA: Diagnosis not present

## 2016-07-30 DIAGNOSIS — D0359 Melanoma in situ of other part of trunk: Secondary | ICD-10-CM | POA: Diagnosis not present

## 2016-07-30 DIAGNOSIS — D485 Neoplasm of uncertain behavior of skin: Secondary | ICD-10-CM | POA: Diagnosis not present

## 2016-08-27 ENCOUNTER — Ambulatory Visit (HOSPITAL_BASED_OUTPATIENT_CLINIC_OR_DEPARTMENT_OTHER): Payer: Medicare Other | Admitting: Hematology & Oncology

## 2016-08-27 ENCOUNTER — Encounter: Payer: Self-pay | Admitting: Hematology & Oncology

## 2016-08-27 ENCOUNTER — Other Ambulatory Visit (HOSPITAL_BASED_OUTPATIENT_CLINIC_OR_DEPARTMENT_OTHER): Payer: Medicare Other

## 2016-08-27 VITALS — BP 134/72 | HR 73 | Temp 97.4°F | Resp 18 | Wt 169.0 lb

## 2016-08-27 DIAGNOSIS — C9110 Chronic lymphocytic leukemia of B-cell type not having achieved remission: Secondary | ICD-10-CM

## 2016-08-27 LAB — CHCC SATELLITE - SMEAR

## 2016-08-27 LAB — MANUAL DIFFERENTIAL (CHCC SATELLITE)
ALC: 34.6 10*3/uL — ABNORMAL HIGH (ref 0.6–2.2)
ANC (CHCC HP manual diff): 1.8 10*3/uL (ref 1.5–6.7)
LYMPH: 95 % — ABNORMAL HIGH (ref 14–48)
MONO: 1 % (ref 0–13)
PLT EST ~~LOC~~: DECREASED
RBC Comments: NORMAL
SEG: 5 % — ABNORMAL LOW (ref 40–75)

## 2016-08-27 LAB — CBC WITH DIFFERENTIAL (CANCER CENTER ONLY)
HCT: 37.5 % (ref 34.8–46.6)
HGB: 12.4 g/dL (ref 11.6–15.9)
MCH: 32.7 pg (ref 26.0–34.0)
MCHC: 33.1 g/dL (ref 32.0–36.0)
MCV: 99 fL (ref 81–101)
Platelets: 129 10*3/uL — ABNORMAL LOW (ref 145–400)
RBC: 3.79 10*6/uL (ref 3.70–5.32)
RDW: 13.7 % (ref 11.1–15.7)
WBC: 36.5 10*3/uL — ABNORMAL HIGH (ref 3.9–10.0)

## 2016-08-27 NOTE — Progress Notes (Signed)
Hematology and Oncology Follow Up Visit  Tamara Powell [id number] Feb 19, 1946 71 y.o. 08/27/2016   Principle Diagnosis:   Stage A CLL  Current Therapy:    Observation     Interim History:  Ms.  Tamara Powell is back for followup. She looks great. She had a very good fall and holiday season. She and her husband have been busy helping with the grandchildren.  She has had no problems with bleeding or bruising. She's had no change in bowel or bladder habits. She's had no cough. She's had no leg swelling. She's had no weight loss or weight gain.  There's not been any change in her medications.  Overall, her performance status is ECOG 0.  Medications:  Current Outpatient Prescriptions:  .  Apple Cid Vn-Grn Tea-Bit Or-Cr (GREEN TEA SLIM WITH EGCG PO), Take by mouth.  , Disp: , Rfl:  .  aspirin 81 MG tablet, Take 81 mg by mouth daily.  , Disp: , Rfl:  .  calcium carbonate (OS-CAL) 600 MG TABS, Take 600 mg by mouth 2 (two) times daily with a meal.  , Disp: , Rfl:  .  co-enzyme Q-10 30 MG capsule, Take 100 mg by mouth 2 (two) times daily.  , Disp: , Rfl:  .  Evening Primrose Oil 500 MG CAPS, Take 500 mg by mouth 2 (two) times daily.  , Disp: , Rfl:  .  fish oil-omega-3 fatty acids 1000 MG capsule, Take 1 g by mouth 2 (two) times daily.  , Disp: , Rfl:  .  fluticasone (FLONASE) 50 MCG/ACT nasal spray, Place 1 spray into the nose as needed. , Disp: , Rfl:  .  ketoconazole (NIZORAL) 2 % cream, , Disp: , Rfl:  .  Multiple Vitamin (MULTIVITAMIN) tablet, Take 1 tablet by mouth daily.  , Disp: , Rfl:  .  Nutritional Supplements (GRAPESEED EXTRACT) 500-50 MG CAPS, Take by mouth 2 (two) times daily.  , Disp: , Rfl:  .  OVER THE COUNTER MEDICATION, Take by mouth 2 (two) times daily. WELLNESS FORMULA CAPSULE--HAIR SKIN NAILS CAPSULES., Disp: , Rfl:  .  Turmeric 450 MG CAPS, Take by mouth every morning., Disp: , Rfl:  .  UNABLE TO FIND, Take by mouth every morning. Quercetin   & a  E 3 live, Disp: , Rfl:    Allergies:  Allergies  Allergen Reactions  . Alendronate Sodium Other (See Comments)    Jaw and knee swelling.    Past Medical History, Surgical history, Social history, and Family History were reviewed and updated.  Review of Systems: As above  Physical Exam:  weight is 169 lb (76.7 kg). Her oral temperature is 97.4 F (36.3 C). Her blood pressure is 134/72 and her pulse is 73. Her respiration is 18 and oxygen saturation is 100%.   Head and neck exam shows no ocular or oral lesions. She has no palpable lymph glands in the neck, supraclavicular region or axilla. Lungs are clear. Cardiac exam regular rate and rhythm with no murmurs, rubs or bruits.. Abdomen is soft. She has good bowel sounds. There is no fluid wave. There is no palpable liver or spleen tip.. Back exam shows no tenderness over the spine ribs or hips. Extremities no clubbing cyanosis or edema. She has good range of motion of her joints. She has good muscle strength. Skin exam shows no rashes, ecchymoses or petechia.. No suspicious hyperpigmented lesions. Neurological exam no focal deficits.  Lab Results  Component Value Date   WBC 39.1 (H)  08/30/2015   HGB 12.6 08/30/2015   HCT 38.0 08/30/2015   MCV 94 08/30/2015   PLT 158 08/30/2015     Chemistry      Component Value Date/Time   NA 142 08/30/2015 0941   K 4.9 08/30/2015 0941   CL 105 05/02/2014 0948   CO2 27 08/30/2015 0941   BUN 11.8 08/30/2015 0941   CREATININE 1.0 08/30/2015 0941      Component Value Date/Time   CALCIUM 9.5 08/30/2015 0941   ALKPHOS 62 08/30/2015 0941   AST 19 08/30/2015 0941   ALT 13 08/30/2015 0941   BILITOT 1.25 (H) 08/30/2015 0941         Impression and Plan: [name] is a very pleasant 71 year old white female with CLL. So far, there has been no evidence that we have to treat her. Her white cell count is very stable.  Her platelet count is down a little bit. However, I still think that we can get her back in one year.  She certainly knows that she can come back to see [place] sooner if there is a problem.   I looked at her smear. I did not see anything that looked suspicious. She does have some smudge cells. She is the increase in lymphocytes. Her myeloid cells appear mature.  Her platelets appear adequate. She has good granulation of her platelets.[name], MD 1/23/201810:21 AM

## 2016-09-06 DIAGNOSIS — S336XXA Sprain of sacroiliac joint, initial encounter: Secondary | ICD-10-CM | POA: Diagnosis not present

## 2016-09-06 DIAGNOSIS — M9905 Segmental and somatic dysfunction of pelvic region: Secondary | ICD-10-CM | POA: Diagnosis not present

## 2016-09-10 DIAGNOSIS — S52502A Unspecified fracture of the lower end of left radius, initial encounter for closed fracture: Secondary | ICD-10-CM | POA: Diagnosis not present

## 2016-09-10 DIAGNOSIS — S52612A Displaced fracture of left ulna styloid process, initial encounter for closed fracture: Secondary | ICD-10-CM | POA: Diagnosis not present

## 2016-09-10 DIAGNOSIS — S32020A Wedge compression fracture of second lumbar vertebra, initial encounter for closed fracture: Secondary | ICD-10-CM | POA: Diagnosis not present

## 2016-09-10 DIAGNOSIS — S52622A Torus fracture of lower end of left ulna, initial encounter for closed fracture: Secondary | ICD-10-CM | POA: Diagnosis not present

## 2016-09-10 DIAGNOSIS — M25532 Pain in left wrist: Secondary | ICD-10-CM | POA: Diagnosis not present

## 2016-09-10 DIAGNOSIS — S52602A Unspecified fracture of lower end of left ulna, initial encounter for closed fracture: Secondary | ICD-10-CM | POA: Diagnosis not present

## 2016-09-10 DIAGNOSIS — T148XXA Other injury of unspecified body region, initial encounter: Secondary | ICD-10-CM | POA: Diagnosis not present

## 2016-09-10 DIAGNOSIS — M545 Low back pain: Secondary | ICD-10-CM | POA: Diagnosis not present

## 2016-09-13 DIAGNOSIS — S52502A Unspecified fracture of the lower end of left radius, initial encounter for closed fracture: Secondary | ICD-10-CM | POA: Diagnosis not present

## 2016-09-13 DIAGNOSIS — S32020A Wedge compression fracture of second lumbar vertebra, initial encounter for closed fracture: Secondary | ICD-10-CM | POA: Diagnosis not present

## 2016-09-16 DIAGNOSIS — X58XXXA Exposure to other specified factors, initial encounter: Secondary | ICD-10-CM | POA: Diagnosis not present

## 2016-09-16 DIAGNOSIS — S32020A Wedge compression fracture of second lumbar vertebra, initial encounter for closed fracture: Secondary | ICD-10-CM | POA: Diagnosis not present

## 2016-09-18 DIAGNOSIS — S32020S Wedge compression fracture of second lumbar vertebra, sequela: Secondary | ICD-10-CM | POA: Diagnosis not present

## 2016-09-24 DIAGNOSIS — Z4689 Encounter for fitting and adjustment of other specified devices: Secondary | ICD-10-CM | POA: Diagnosis not present

## 2016-09-24 DIAGNOSIS — S32020A Wedge compression fracture of second lumbar vertebra, initial encounter for closed fracture: Secondary | ICD-10-CM | POA: Diagnosis not present

## 2016-09-24 DIAGNOSIS — M8008XA Age-related osteoporosis with current pathological fracture, vertebra(e), initial encounter for fracture: Secondary | ICD-10-CM | POA: Diagnosis not present

## 2016-09-24 DIAGNOSIS — M40209 Unspecified kyphosis, site unspecified: Secondary | ICD-10-CM | POA: Diagnosis not present

## 2016-09-26 DIAGNOSIS — Z1389 Encounter for screening for other disorder: Secondary | ICD-10-CM | POA: Diagnosis not present

## 2016-09-26 DIAGNOSIS — M8580 Other specified disorders of bone density and structure, unspecified site: Secondary | ICD-10-CM | POA: Diagnosis not present

## 2016-09-26 DIAGNOSIS — Z9181 History of falling: Secondary | ICD-10-CM | POA: Diagnosis not present

## 2016-09-26 DIAGNOSIS — S32020A Wedge compression fracture of second lumbar vertebra, initial encounter for closed fracture: Secondary | ICD-10-CM | POA: Diagnosis not present

## 2016-09-27 DIAGNOSIS — S52502D Unspecified fracture of the lower end of left radius, subsequent encounter for closed fracture with routine healing: Secondary | ICD-10-CM | POA: Diagnosis not present

## 2016-10-01 DIAGNOSIS — J029 Acute pharyngitis, unspecified: Secondary | ICD-10-CM | POA: Diagnosis not present

## 2016-10-01 DIAGNOSIS — J0100 Acute maxillary sinusitis, unspecified: Secondary | ICD-10-CM | POA: Diagnosis not present

## 2016-10-04 DIAGNOSIS — R0600 Dyspnea, unspecified: Secondary | ICD-10-CM | POA: Diagnosis not present

## 2016-10-04 DIAGNOSIS — J0100 Acute maxillary sinusitis, unspecified: Secondary | ICD-10-CM | POA: Diagnosis not present

## 2016-10-04 DIAGNOSIS — R062 Wheezing: Secondary | ICD-10-CM | POA: Diagnosis not present

## 2016-10-04 DIAGNOSIS — J029 Acute pharyngitis, unspecified: Secondary | ICD-10-CM | POA: Diagnosis not present

## 2016-10-11 DIAGNOSIS — S52502D Unspecified fracture of the lower end of left radius, subsequent encounter for closed fracture with routine healing: Secondary | ICD-10-CM | POA: Diagnosis not present

## 2016-10-15 DIAGNOSIS — M40209 Unspecified kyphosis, site unspecified: Secondary | ICD-10-CM | POA: Diagnosis not present

## 2016-10-15 DIAGNOSIS — Z6826 Body mass index (BMI) 26.0-26.9, adult: Secondary | ICD-10-CM | POA: Diagnosis not present

## 2016-10-15 DIAGNOSIS — S32020D Wedge compression fracture of second lumbar vertebra, subsequent encounter for fracture with routine healing: Secondary | ICD-10-CM | POA: Diagnosis not present

## 2016-10-15 DIAGNOSIS — M8008XA Age-related osteoporosis with current pathological fracture, vertebra(e), initial encounter for fracture: Secondary | ICD-10-CM | POA: Diagnosis not present

## 2016-10-29 DIAGNOSIS — S52502D Unspecified fracture of the lower end of left radius, subsequent encounter for closed fracture with routine healing: Secondary | ICD-10-CM | POA: Diagnosis not present

## 2016-11-04 DIAGNOSIS — M25532 Pain in left wrist: Secondary | ICD-10-CM | POA: Diagnosis not present

## 2016-11-04 DIAGNOSIS — S52502D Unspecified fracture of the lower end of left radius, subsequent encounter for closed fracture with routine healing: Secondary | ICD-10-CM | POA: Diagnosis not present

## 2016-11-07 DIAGNOSIS — S52502D Unspecified fracture of the lower end of left radius, subsequent encounter for closed fracture with routine healing: Secondary | ICD-10-CM | POA: Diagnosis not present

## 2016-11-07 DIAGNOSIS — M25532 Pain in left wrist: Secondary | ICD-10-CM | POA: Diagnosis not present

## 2016-11-11 ENCOUNTER — Ambulatory Visit (HOSPITAL_BASED_OUTPATIENT_CLINIC_OR_DEPARTMENT_OTHER): Payer: Medicare Other | Admitting: Family

## 2016-11-11 ENCOUNTER — Telehealth: Payer: Self-pay | Admitting: *Deleted

## 2016-11-11 ENCOUNTER — Other Ambulatory Visit: Payer: Self-pay | Admitting: Family

## 2016-11-11 ENCOUNTER — Ambulatory Visit (HOSPITAL_BASED_OUTPATIENT_CLINIC_OR_DEPARTMENT_OTHER): Payer: Medicare Other

## 2016-11-11 VITALS — BP 127/67 | HR 86 | Temp 98.1°F | Resp 17 | Wt 169.8 lb

## 2016-11-11 DIAGNOSIS — D509 Iron deficiency anemia, unspecified: Secondary | ICD-10-CM

## 2016-11-11 DIAGNOSIS — C9110 Chronic lymphocytic leukemia of B-cell type not having achieved remission: Secondary | ICD-10-CM

## 2016-11-11 DIAGNOSIS — M25532 Pain in left wrist: Secondary | ICD-10-CM | POA: Diagnosis not present

## 2016-11-11 DIAGNOSIS — R5383 Other fatigue: Secondary | ICD-10-CM

## 2016-11-11 DIAGNOSIS — E039 Hypothyroidism, unspecified: Secondary | ICD-10-CM

## 2016-11-11 DIAGNOSIS — S52502D Unspecified fracture of the lower end of left radius, subsequent encounter for closed fracture with routine healing: Secondary | ICD-10-CM | POA: Diagnosis not present

## 2016-11-11 LAB — CBC WITH DIFFERENTIAL (CANCER CENTER ONLY)
HCT: 37.4 % (ref 34.8–46.6)
HGB: 12.5 g/dL (ref 11.6–15.9)
MCH: 32.1 pg (ref 26.0–34.0)
MCHC: 33.4 g/dL (ref 32.0–36.0)
MCV: 96 fL (ref 81–101)
Platelets: 157 10*3/uL (ref 145–400)
RBC: 3.9 10*6/uL (ref 3.70–5.32)
RDW: 13.7 % (ref 11.1–15.7)
WBC: 34.4 10*3/uL — ABNORMAL HIGH (ref 3.9–10.0)

## 2016-11-11 LAB — CHCC SATELLITE - SMEAR

## 2016-11-11 LAB — CMP (CANCER CENTER ONLY)
ALT(SGPT): 20 U/L (ref 10–47)
AST: 26 U/L (ref 11–38)
Albumin: 3.7 g/dL (ref 3.3–5.5)
Alkaline Phosphatase: 65 U/L (ref 26–84)
BUN, Bld: 12 mg/dL (ref 7–22)
CO2: 28 mEq/L (ref 18–33)
Calcium: 9.4 mg/dL (ref 8.0–10.3)
Chloride: 103 mEq/L (ref 98–108)
Creat: 0.9 mg/dl (ref 0.6–1.2)
Glucose, Bld: 95 mg/dL (ref 73–118)
Potassium: 4.1 mEq/L (ref 3.3–4.7)
Sodium: 139 mEq/L (ref 128–145)
Total Bilirubin: 0.9 mg/dl (ref 0.20–1.60)
Total Protein: 6.6 g/dL (ref 6.4–8.1)

## 2016-11-11 LAB — MANUAL DIFFERENTIAL (CHCC SATELLITE)
ALC: 32 10*3/uL — ABNORMAL HIGH (ref 0.6–2.2)
ANC (CHCC HP manual diff): 1.7 10*3/uL (ref 1.5–6.7)
Eos: 1 % (ref 0–7)
LYMPH: 93 % — ABNORMAL HIGH (ref 14–48)
MONO: 1 % (ref 0–13)
PLT EST ~~LOC~~: ADEQUATE
RBC Comments: NORMAL
SEG: 5 % — ABNORMAL LOW (ref 40–75)

## 2016-11-11 NOTE — Progress Notes (Signed)
Hematology and Oncology Follow Up Visit  Tamara Powell [id number] 07-Aug-1945 71 y.o. 11/11/2016   Principle Diagnosis:  Stage A CLL  Current Therapy:   Observation   Interim History:  Tamara Powell is here today with c/o fatigue and "just not feeling right". She states that she fell in January breaking her wrist and fracturing her vertebrae. She was able to heal without surgery. She then developed pneumonia while recuperating from her fall. It has been a rough few months for her.  Her counts today are stable. No anemia and platelet count is 157. WBC count is stable at 34.4.  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 We will try and get the report from her dermatologist.  No fever, chills, n/v, cough, rash, dizziness, SOB, chest pain, palpitations, abdominal pain or changes in bowel or bladder habits.  No lymphadenopathy found on exam. No episodes of bleeding, bruising or petechiae.  She has occasional night sweats.  No swelling, tenderness, numbness or tingling in her extremities. No c/o pain at this time.  She has maintained a good appetite and is staying well hydrated. Her weight is stable.   ECOG Performance Status: 1 - Symptomatic but completely ambulatory  Medications:  Allergies as of 11/11/2016      Reactions   Alendronate Sodium Other (See Comments)   Jaw and knee swelling.      Medication List       Accurate as of 11/11/16  3:49 PM. Always use your most recent med list.          aspirin 81 MG tablet Take 81 mg by mouth daily.   calcium carbonate 600 MG Tabs tablet Commonly known as:  OS-CAL Take 600 mg by mouth 2 (two) times daily with a meal.   co-enzyme Q-10 30 MG capsule Take 100 mg by mouth 2 (two) times daily.   Evening Primrose Oil 500 MG Caps Take 500 mg by mouth 2 (two) times daily.   fish oil-omega-3 fatty acids 1000 MG capsule Take 1 g by mouth 2 (two) times daily.   fluticasone 50 MCG/ACT nasal  spray Commonly known as:  FLONASE Place 1 spray into the nose as needed.   Grapeseed Extract 500-50 MG Caps Take by mouth 2 (two) times daily.   GREEN TEA SLIM WITH EGCG PO Take by mouth.   ketoconazole 2 % cream Commonly known as:  NIZORAL   multivitamin tablet Take 1 tablet by mouth daily.   OVER THE COUNTER MEDICATION Take by mouth 2 (two) times daily. WELLNESS FORMULA CAPSULE--HAIR SKIN NAILS CAPSULES.   Turmeric 450 MG Caps Take by mouth every morning.   UNABLE TO FIND Take by mouth every morning. Quercetin   & a  E 3 live       Allergies:  Allergies  Allergen Reactions  . Alendronate Sodium Other (See Comments)    Jaw and knee swelling.    Past Medical History, Surgical history, Social history, and Family History were reviewed and updated.  Review of Systems: All other 10 point review of systems is negative.   Physical Exam:  weight is 169 lb 12.8 oz (77 kg). Her oral temperature is 98.1 F (36.7 C). Her blood pressure is 127/67 and her pulse is 86. Her respiration is 17 and oxygen saturation is 99%.   Wt Readings from Last 3 Encounters:  11/11/16 169 lb 12.8 oz (77 kg)  08/27/16 169 lb (76.7 kg)  08/30/15 169 lb (  76.7 kg)    Ocular: Sclerae unicteric, pupils equal, round and reactive to light Ear-nose-throat: Oropharynx clear, dentition fair Lymphatic: No cervical, axillary or supraclavicular adenopathy Lungs no rales or rhonchi, good excursion bilaterally Heart regular rate and rhythm, no murmur appreciated Abd soft, nontender, positive bowel sounds, no liver or spleen tip palpated on exam, no fluid wave MSK no focal spinal tenderness, no joint edema Neuro: non-focal, well-oriented, appropriate affect Breasts: Deferred  Lab Results  Component Value Date   WBC 34.4 (H) 11/11/2016   HGB 12.5 11/11/2016   HCT 37.4 11/11/2016   MCV 96 11/11/2016   PLT 157 11/11/2016   Lab Results  Component Value Date   FERRITIN 18 05/02/2014   IRON 135  05/02/2014   TIBC 270 05/02/2014   UIBC 135 05/02/2014   IRONPCTSAT 50 05/02/2014   Lab Results  Component Value Date   RETICCTPCT 1.5 12/17/2010   RBC 3.90 11/11/2016   RETICCTABS 58.2 12/17/2010   No results found for: Nils Pyle, [facility] Lab Results  Component Value Date   IGGSERUM 600 (L) 05/02/2014   IGA 94 05/02/2014   IGMSERUM 26 (L) 05/02/2014   Lab Results  Component Value Date   TOTALPROTELP 6.1 12/17/2010   ALBUMINELP 66.1 12/17/2010   A1GS 3.6 12/17/2010   A2GS 8.8 12/17/2010   BETS 5.8 12/17/2010   BETA2SER 3.9 12/17/2010   GAMS 11.8 12/17/2010   MSPIKE NOT DET 12/17/2010   SPEI * 12/17/2010     Chemistry      Component Value Date/Time   NA 139 11/11/2016 1426   NA 142 08/30/2015 0941   K 4.1 11/11/2016 1426   K 4.9 08/30/2015 0941   CL 103 11/11/2016 1426   CO2 28 11/11/2016 1426   CO2 27 08/30/2015 0941   BUN 12 11/11/2016 1426   BUN 11.8 08/30/2015 0941   CREATININE 0.9 11/11/2016 1426   CREATININE 1.0 08/30/2015 0941      Component Value Date/Time   CALCIUM 9.4 11/11/2016 1426   CALCIUM 9.5 08/30/2015 0941   ALKPHOS 65 11/11/2016 1426   ALKPHOS 62 08/30/2015 0941   AST 26 11/11/2016 1426   AST 19 08/30/2015 0941   ALT 20 11/11/2016 1426   ALT 13 08/30/2015 0941   BILITOT 0.90 11/11/2016 1426   BILITOT 1.25 (H) 08/30/2015 0941     Impression and Plan: Tamara Powell is a very pleasant 71 yo white female with CLL. So far she has done well and has not required treatment. She had a bad fall in January followed by a bout with pneumonia. She has had no other issue with infection. She is here today with c/o fatigue and not feeling right. Her CBC and CMP are stable. I also added a TSH and iron studies to her lab work and we will see what these show.  She has her current schedule with follow-up in January 2019.  I spent at least 25 minutes face to face counseling with the patient and her husband.  Both she and her husband know to  contact our office with any questions or concerns. We can certailny see her sooner if need be.   [name], NP 4/9/20183:49 PM

## 2016-11-11 NOTE — Telephone Encounter (Signed)
Patient c/o "not feeling well". She states she feels run down and tired. She had a fall earlier this year and doesn't feel like she getting better. She was seen in this office in January without any concerns identified. She wants to be checked out.  Spoke to Dr Marin Olp. He is okay with bringing the patient in to see Judson Roch sometime in the next 10 days. Message sent to scheduler.

## 2016-11-12 DIAGNOSIS — M40209 Unspecified kyphosis, site unspecified: Secondary | ICD-10-CM | POA: Diagnosis not present

## 2016-11-12 DIAGNOSIS — Z6825 Body mass index (BMI) 25.0-25.9, adult: Secondary | ICD-10-CM | POA: Diagnosis not present

## 2016-11-12 DIAGNOSIS — M8008XA Age-related osteoporosis with current pathological fracture, vertebra(e), initial encounter for fracture: Secondary | ICD-10-CM | POA: Diagnosis not present

## 2016-11-12 DIAGNOSIS — S32020D Wedge compression fracture of second lumbar vertebra, subsequent encounter for fracture with routine healing: Secondary | ICD-10-CM | POA: Diagnosis not present

## 2016-11-12 LAB — IRON AND TIBC
%SAT: 16 % — ABNORMAL LOW (ref 21–57)
Iron: 49 ug/dL (ref 41–142)
TIBC: 311 ug/dL (ref 236–444)
UIBC: 262 ug/dL (ref 120–384)

## 2016-11-12 LAB — TSH: TSH: 4.037 m(IU)/L — ABNORMAL HIGH (ref 0.308–3.960)

## 2016-11-12 LAB — FERRITIN: Ferritin: 16 ng/ml (ref 9–269)

## 2016-11-14 ENCOUNTER — Ambulatory Visit (HOSPITAL_BASED_OUTPATIENT_CLINIC_OR_DEPARTMENT_OTHER): Payer: Medicare Other

## 2016-11-14 ENCOUNTER — Other Ambulatory Visit: Payer: Self-pay | Admitting: Family

## 2016-11-14 VITALS — BP 118/63 | HR 81 | Temp 98.9°F | Resp 18

## 2016-11-14 DIAGNOSIS — D508 Other iron deficiency anemias: Secondary | ICD-10-CM

## 2016-11-14 DIAGNOSIS — D509 Iron deficiency anemia, unspecified: Secondary | ICD-10-CM | POA: Insufficient documentation

## 2016-11-14 MED ORDER — SODIUM CHLORIDE 0.9 % IV SOLN
510.0000 mg | Freq: Once | INTRAVENOUS | Status: AC
  Administered 2016-11-14: 510 mg via INTRAVENOUS
  Filled 2016-11-14: qty 17

## 2016-11-14 MED ORDER — SODIUM CHLORIDE 0.9 % IV SOLN
Freq: Once | INTRAVENOUS | Status: AC
  Administered 2016-11-14: 15:00:00 via INTRAVENOUS

## 2016-11-14 NOTE — Patient Instructions (Signed)

## 2016-11-22 DIAGNOSIS — S52502D Unspecified fracture of the lower end of left radius, subsequent encounter for closed fracture with routine healing: Secondary | ICD-10-CM | POA: Diagnosis not present

## 2016-11-22 DIAGNOSIS — M25532 Pain in left wrist: Secondary | ICD-10-CM | POA: Diagnosis not present

## 2016-11-27 ENCOUNTER — Encounter: Payer: Self-pay | Admitting: Hematology & Oncology

## 2016-11-27 DIAGNOSIS — S52502D Unspecified fracture of the lower end of left radius, subsequent encounter for closed fracture with routine healing: Secondary | ICD-10-CM | POA: Diagnosis not present

## 2016-11-27 DIAGNOSIS — M25532 Pain in left wrist: Secondary | ICD-10-CM | POA: Diagnosis not present

## 2016-12-03 DIAGNOSIS — S52502D Unspecified fracture of the lower end of left radius, subsequent encounter for closed fracture with routine healing: Secondary | ICD-10-CM | POA: Diagnosis not present

## 2016-12-04 DIAGNOSIS — M25532 Pain in left wrist: Secondary | ICD-10-CM | POA: Diagnosis not present

## 2016-12-04 DIAGNOSIS — S52502D Unspecified fracture of the lower end of left radius, subsequent encounter for closed fracture with routine healing: Secondary | ICD-10-CM | POA: Diagnosis not present

## 2016-12-06 DIAGNOSIS — S52502D Unspecified fracture of the lower end of left radius, subsequent encounter for closed fracture with routine healing: Secondary | ICD-10-CM | POA: Diagnosis not present

## 2016-12-06 DIAGNOSIS — M25532 Pain in left wrist: Secondary | ICD-10-CM | POA: Diagnosis not present

## 2016-12-09 DIAGNOSIS — S52502D Unspecified fracture of the lower end of left radius, subsequent encounter for closed fracture with routine healing: Secondary | ICD-10-CM | POA: Diagnosis not present

## 2016-12-09 DIAGNOSIS — M25532 Pain in left wrist: Secondary | ICD-10-CM | POA: Diagnosis not present

## 2016-12-25 ENCOUNTER — Other Ambulatory Visit (HOSPITAL_BASED_OUTPATIENT_CLINIC_OR_DEPARTMENT_OTHER): Payer: Medicare Other

## 2016-12-25 ENCOUNTER — Ambulatory Visit (HOSPITAL_BASED_OUTPATIENT_CLINIC_OR_DEPARTMENT_OTHER): Payer: Medicare Other | Admitting: Family

## 2016-12-25 ENCOUNTER — Ambulatory Visit: Payer: Medicare Other

## 2016-12-25 VITALS — BP 106/66 | HR 81 | Temp 98.1°F | Resp 16 | Wt 161.1 lb

## 2016-12-25 DIAGNOSIS — D508 Other iron deficiency anemias: Secondary | ICD-10-CM | POA: Diagnosis not present

## 2016-12-25 DIAGNOSIS — C9110 Chronic lymphocytic leukemia of B-cell type not having achieved remission: Secondary | ICD-10-CM

## 2016-12-25 LAB — CBC WITH DIFFERENTIAL (CANCER CENTER ONLY)
BASO#: 0 10*3/uL (ref 0.0–0.2)
BASO%: 0.1 % (ref 0.0–2.0)
EOS%: 0.5 % (ref 0.0–7.0)
Eosinophils Absolute: 0.1 10*3/uL (ref 0.0–0.5)
HCT: 38.6 % (ref 34.8–46.6)
HGB: 13.2 g/dL (ref 11.6–15.9)
LYMPH#: 24.7 10*3/uL — ABNORMAL HIGH (ref 0.9–3.3)
LYMPH%: 89 % — ABNORMAL HIGH (ref 14.0–48.0)
MCH: 33 pg (ref 26.0–34.0)
MCHC: 34.2 g/dL (ref 32.0–36.0)
MCV: 97 fL (ref 81–101)
MONO#: 0.7 10*3/uL (ref 0.1–0.9)
MONO%: 2.5 % (ref 0.0–13.0)
NEUT#: 2.2 10*3/uL (ref 1.5–6.5)
NEUT%: 7.9 % — ABNORMAL LOW (ref 39.6–80.0)
Platelets: 131 10*3/uL — ABNORMAL LOW (ref 145–400)
RBC: 4 10*6/uL (ref 3.70–5.32)
RDW: 13.8 % (ref 11.1–15.7)
WBC: 27.7 10*3/uL — ABNORMAL HIGH (ref 3.9–10.0)

## 2016-12-25 LAB — TECHNOLOGIST REVIEW CHCC SATELLITE

## 2016-12-25 LAB — CHCC SATELLITE - SMEAR

## 2016-12-25 NOTE — Progress Notes (Signed)
Hematology and Oncology Follow Up Visit  Tamara Powell [id number] 1945/08/10 71 y.o. 12/25/2016   Principle Diagnosis:  Stage A CLL Iron deficiency anemia   Current Therapy:   Observation IV iron as indicated    Interim History:  Tamara Powell is here today for follow-up. She is feeling much better since receiving IV iron in April. She denies fatigue. She has added some red meat back into her diet to see if this will help with her iron. We have contacted Dr. Elvera Lennox with Dermatology's office requesting her melanoma records and pathology.  No fever, chills, n/v, cough, rash, dizziness, SOB, chest pain, palpitations, abdominal pain or changes in bowel or bladder habits.  No swelling, tenderness, numbness or tingling in her extremities. No c/o pain at this time.  She has maintained a good appetite and is staying well hydrated. Her weight is stable.   ECOG Performance Status: 1 - Symptomatic but completely ambulatory  Medications:  Allergies as of 12/25/2016      Reactions   Alendronate Sodium Other (See Comments)   Jaw and knee swelling.      Medication List       Accurate as of 12/25/16 10:26 AM. Always use your most recent med list.          aspirin 81 MG tablet Take 81 mg by mouth daily.   calcium carbonate 600 MG Tabs tablet Commonly known as:  OS-CAL Take 600 mg by mouth 2 (two) times daily with a meal.   co-enzyme Q-10 30 MG capsule Take 100 mg by mouth 2 (two) times daily.   Evening Primrose Oil 500 MG Caps Take 500 mg by mouth 2 (two) times daily.   fish oil-omega-3 fatty acids 1000 MG capsule Take 1 g by mouth 2 (two) times daily.   fluticasone 50 MCG/ACT nasal spray Commonly known as:  FLONASE Place 1 spray into the nose as needed.   Grapeseed Extract 500-50 MG Caps Take by mouth 2 (two) times daily.   GREEN TEA SLIM WITH EGCG PO Take by mouth.   ketoconazole 2 % cream Commonly known as:  NIZORAL   multivitamin tablet Take 1 tablet by mouth  daily.   OVER THE COUNTER MEDICATION Take by mouth 2 (two) times daily. WELLNESS FORMULA CAPSULE--HAIR SKIN NAILS CAPSULES.   Turmeric 450 MG Caps Take by mouth every morning.   UNABLE TO FIND Take by mouth every morning. Quercetin   & a  E 3 live       Allergies:  Allergies  Allergen Reactions  . Alendronate Sodium Other (See Comments)    Jaw and knee swelling.    Past Medical History, Surgical history, Social history, and Family History were reviewed and updated.  Review of Systems: All other 10 point review of systems is negative.   Physical Exam:  vitals were not taken for this visit.  Wt Readings from Last 3 Encounters:  11/11/16 169 lb 12.8 oz (77 kg)  08/27/16 169 lb (76.7 kg)  08/30/15 169 lb (76.7 kg)    Ocular: Sclerae unicteric, pupils equal, round and reactive to light Ear-nose-throat: Oropharynx clear, dentition fair Lymphatic: No cervical, supraclavicular or axillary adenopathy Lungs no rales or rhonchi, good excursion bilaterally Heart regular rate and rhythm, no murmur appreciated Abd soft, nontender, positive bowel sounds, no liver or spleen tip palpated on exam, no fluid wave  MSK no focal spinal tenderness, no joint edema Neuro: non-focal, well-oriented, appropriate affect Breasts: Deferred   Lab Results  Component Value  Date   WBC 34.4 (H) 11/11/2016   HGB 12.5 11/11/2016   HCT 37.4 11/11/2016   MCV 96 11/11/2016   PLT 157 11/11/2016   Lab Results  Component Value Date   FERRITIN 16 11/11/2016   IRON 49 11/11/2016   TIBC 311 11/11/2016   UIBC 262 11/11/2016   IRONPCTSAT 16 (L) 11/11/2016   Lab Results  Component Value Date   RETICCTPCT 1.5 12/17/2010   RBC 3.90 11/11/2016   RETICCTABS 58.2 12/17/2010   No results found for: Nils Pyle, [facility] Lab Results  Component Value Date   IGGSERUM 600 (L) 05/02/2014   IGA 94 05/02/2014   IGMSERUM 26 (L) 05/02/2014   Lab Results  Component Value Date   TOTALPROTELP  6.1 12/17/2010   ALBUMINELP 66.1 12/17/2010   A1GS 3.6 12/17/2010   A2GS 8.8 12/17/2010   BETS 5.8 12/17/2010   BETA2SER 3.9 12/17/2010   GAMS 11.8 12/17/2010   MSPIKE NOT DET 12/17/2010   SPEI * 12/17/2010     Chemistry      Component Value Date/Time   NA 139 11/11/2016 1426   NA 142 08/30/2015 0941   K 4.1 11/11/2016 1426   K 4.9 08/30/2015 0941   CL 103 11/11/2016 1426   CO2 28 11/11/2016 1426   CO2 27 08/30/2015 0941   BUN 12 11/11/2016 1426   BUN 11.8 08/30/2015 0941   CREATININE 0.9 11/11/2016 1426   CREATININE 1.0 08/30/2015 0941      Component Value Date/Time   CALCIUM 9.4 11/11/2016 1426   CALCIUM 9.5 08/30/2015 0941   ALKPHOS 65 11/11/2016 1426   ALKPHOS 62 08/30/2015 0941   AST 26 11/11/2016 1426   AST 19 08/30/2015 0941   ALT 20 11/11/2016 1426   ALT 13 08/30/2015 0941   BILITOT 0.90 11/11/2016 1426   BILITOT 1.25 (H) 08/30/2015 0941      Impression and Plan: Tamara Powell is a very pleasant 71 yo caucasian female with CLL as well as iron deficiency anemia. WBC count is improved at 27.7. No anemia and platelet count is stable at 131.  She is feeling much better since receiving IV iron in April. She is asymptomatic at this time and has no complaints.  We will see what her iron studies show and bring her back in later this week for an infusion if needed.  We will plan to see her back in 6 months.  She will contact our office with any questions or concerns. We can certainly see her sooner if need be.   She will contact our office with any questions or concerns. We can certainly see her sooner if need be.   [name], NP 5/23/201810:26 AM

## 2016-12-26 ENCOUNTER — Telehealth: Payer: Self-pay | Admitting: *Deleted

## 2016-12-26 LAB — FERRITIN: Ferritin: 92 ng/ml (ref 9–269)

## 2016-12-26 LAB — IRON AND TIBC
%SAT: 52 % (ref 21–57)
Iron: 120 ug/dL (ref 41–142)
TIBC: 233 ug/dL — ABNORMAL LOW (ref 236–444)
UIBC: 112 ug/dL — ABNORMAL LOW (ref 120–384)

## 2016-12-26 NOTE — Telephone Encounter (Addendum)
Patient is aware of results.    ----- Message from [name], NP sent at 12/26/2016  3:39 PM EDT ----- Regarding: Iron  Iron studies stable. No infusion needed. Thank you!  Sarah  ----- Message ----- From: Interface, Lab In Three Zero One Sent: 12/25/2016  10:39 AM To: [name], NP

## 2016-12-31 DIAGNOSIS — S52502D Unspecified fracture of the lower end of left radius, subsequent encounter for closed fracture with routine healing: Secondary | ICD-10-CM | POA: Diagnosis not present

## 2017-01-27 DIAGNOSIS — S61219A Laceration without foreign body of unspecified finger without damage to nail, initial encounter: Secondary | ICD-10-CM | POA: Diagnosis not present

## 2017-01-27 DIAGNOSIS — S61011A Laceration without foreign body of right thumb without damage to nail, initial encounter: Secondary | ICD-10-CM | POA: Diagnosis not present

## 2017-01-27 DIAGNOSIS — L089 Local infection of the skin and subcutaneous tissue, unspecified: Secondary | ICD-10-CM | POA: Diagnosis not present

## 2017-01-27 DIAGNOSIS — Z23 Encounter for immunization: Secondary | ICD-10-CM | POA: Diagnosis not present

## 2017-01-27 DIAGNOSIS — Z6824 Body mass index (BMI) 24.0-24.9, adult: Secondary | ICD-10-CM | POA: Diagnosis not present

## 2017-02-06 DIAGNOSIS — L814 Other melanin hyperpigmentation: Secondary | ICD-10-CM | POA: Diagnosis not present

## 2017-02-06 DIAGNOSIS — D225 Melanocytic nevi of trunk: Secondary | ICD-10-CM | POA: Diagnosis not present

## 2017-02-06 DIAGNOSIS — D1801 Hemangioma of skin and subcutaneous tissue: Secondary | ICD-10-CM | POA: Diagnosis not present

## 2017-02-06 DIAGNOSIS — L821 Other seborrheic keratosis: Secondary | ICD-10-CM | POA: Diagnosis not present

## 2017-02-06 DIAGNOSIS — D2271 Melanocytic nevi of right lower limb, including hip: Secondary | ICD-10-CM | POA: Diagnosis not present

## 2017-02-06 DIAGNOSIS — D692 Other nonthrombocytopenic purpura: Secondary | ICD-10-CM | POA: Diagnosis not present

## 2017-02-06 DIAGNOSIS — Z85820 Personal history of malignant melanoma of skin: Secondary | ICD-10-CM | POA: Diagnosis not present

## 2017-02-06 DIAGNOSIS — D0472 Carcinoma in situ of skin of left lower limb, including hip: Secondary | ICD-10-CM | POA: Diagnosis not present

## 2017-02-10 DIAGNOSIS — Z6823 Body mass index (BMI) 23.0-23.9, adult: Secondary | ICD-10-CM | POA: Diagnosis not present

## 2017-02-10 DIAGNOSIS — J209 Acute bronchitis, unspecified: Secondary | ICD-10-CM | POA: Diagnosis not present

## 2017-02-18 ENCOUNTER — Encounter: Payer: Self-pay | Admitting: Hematology & Oncology

## 2017-02-26 DIAGNOSIS — J209 Acute bronchitis, unspecified: Secondary | ICD-10-CM | POA: Diagnosis not present

## 2017-02-26 DIAGNOSIS — J0100 Acute maxillary sinusitis, unspecified: Secondary | ICD-10-CM | POA: Diagnosis not present

## 2017-04-18 DIAGNOSIS — S62327A Displaced fracture of shaft of fifth metacarpal bone, left hand, initial encounter for closed fracture: Secondary | ICD-10-CM | POA: Diagnosis not present

## 2017-05-01 DIAGNOSIS — S62327A Displaced fracture of shaft of fifth metacarpal bone, left hand, initial encounter for closed fracture: Secondary | ICD-10-CM | POA: Diagnosis not present

## 2017-05-30 DIAGNOSIS — S62327A Displaced fracture of shaft of fifth metacarpal bone, left hand, initial encounter for closed fracture: Secondary | ICD-10-CM | POA: Diagnosis not present

## 2017-06-30 DIAGNOSIS — S62327A Displaced fracture of shaft of fifth metacarpal bone, left hand, initial encounter for closed fracture: Secondary | ICD-10-CM | POA: Diagnosis not present

## 2017-07-10 DIAGNOSIS — H2513 Age-related nuclear cataract, bilateral: Secondary | ICD-10-CM | POA: Diagnosis not present

## 2017-07-21 DIAGNOSIS — M859 Disorder of bone density and structure, unspecified: Secondary | ICD-10-CM | POA: Diagnosis not present

## 2017-07-21 DIAGNOSIS — D692 Other nonthrombocytopenic purpura: Secondary | ICD-10-CM | POA: Diagnosis not present

## 2017-07-21 DIAGNOSIS — L821 Other seborrheic keratosis: Secondary | ICD-10-CM | POA: Diagnosis not present

## 2017-07-21 DIAGNOSIS — D519 Vitamin B12 deficiency anemia, unspecified: Secondary | ICD-10-CM | POA: Diagnosis not present

## 2017-07-21 DIAGNOSIS — L814 Other melanin hyperpigmentation: Secondary | ICD-10-CM | POA: Diagnosis not present

## 2017-07-21 DIAGNOSIS — Z1331 Encounter for screening for depression: Secondary | ICD-10-CM | POA: Diagnosis not present

## 2017-07-21 DIAGNOSIS — M8580 Other specified disorders of bone density and structure, unspecified site: Secondary | ICD-10-CM | POA: Diagnosis not present

## 2017-07-21 DIAGNOSIS — D485 Neoplasm of uncertain behavior of skin: Secondary | ICD-10-CM | POA: Diagnosis not present

## 2017-07-21 DIAGNOSIS — Z13220 Encounter for screening for lipoid disorders: Secondary | ICD-10-CM | POA: Diagnosis not present

## 2017-07-21 DIAGNOSIS — Z0000 Encounter for general adult medical examination without abnormal findings: Secondary | ICD-10-CM | POA: Diagnosis not present

## 2017-07-21 DIAGNOSIS — M1990 Unspecified osteoarthritis, unspecified site: Secondary | ICD-10-CM | POA: Diagnosis not present

## 2017-07-21 DIAGNOSIS — D1801 Hemangioma of skin and subcutaneous tissue: Secondary | ICD-10-CM | POA: Diagnosis not present

## 2017-07-21 DIAGNOSIS — Z9181 History of falling: Secondary | ICD-10-CM | POA: Diagnosis not present

## 2017-07-21 DIAGNOSIS — Z85828 Personal history of other malignant neoplasm of skin: Secondary | ICD-10-CM | POA: Diagnosis not present

## 2017-07-21 DIAGNOSIS — R5383 Other fatigue: Secondary | ICD-10-CM | POA: Diagnosis not present

## 2017-07-21 DIAGNOSIS — D225 Melanocytic nevi of trunk: Secondary | ICD-10-CM | POA: Diagnosis not present

## 2017-07-21 DIAGNOSIS — Z23 Encounter for immunization: Secondary | ICD-10-CM | POA: Diagnosis not present

## 2017-07-21 DIAGNOSIS — Z780 Asymptomatic menopausal state: Secondary | ICD-10-CM | POA: Diagnosis not present

## 2017-07-21 DIAGNOSIS — E785 Hyperlipidemia, unspecified: Secondary | ICD-10-CM | POA: Diagnosis not present

## 2017-07-25 DIAGNOSIS — Z1231 Encounter for screening mammogram for malignant neoplasm of breast: Secondary | ICD-10-CM | POA: Diagnosis not present

## 2017-08-27 ENCOUNTER — Inpatient Hospital Stay (HOSPITAL_BASED_OUTPATIENT_CLINIC_OR_DEPARTMENT_OTHER): Payer: Medicare Other | Admitting: Hematology & Oncology

## 2017-08-27 ENCOUNTER — Other Ambulatory Visit: Payer: Self-pay

## 2017-08-27 ENCOUNTER — Inpatient Hospital Stay: Payer: Medicare Other | Attending: Hematology & Oncology

## 2017-08-27 ENCOUNTER — Encounter: Payer: Self-pay | Admitting: Hematology & Oncology

## 2017-08-27 VITALS — BP 116/49 | HR 90 | Temp 98.2°F | Resp 16 | Wt 160.0 lb

## 2017-08-27 DIAGNOSIS — D509 Iron deficiency anemia, unspecified: Secondary | ICD-10-CM | POA: Diagnosis not present

## 2017-08-27 DIAGNOSIS — Z79899 Other long term (current) drug therapy: Secondary | ICD-10-CM | POA: Diagnosis not present

## 2017-08-27 DIAGNOSIS — D508 Other iron deficiency anemias: Secondary | ICD-10-CM

## 2017-08-27 DIAGNOSIS — Z7982 Long term (current) use of aspirin: Secondary | ICD-10-CM

## 2017-08-27 DIAGNOSIS — C9110 Chronic lymphocytic leukemia of B-cell type not having achieved remission: Secondary | ICD-10-CM

## 2017-08-27 DIAGNOSIS — D500 Iron deficiency anemia secondary to blood loss (chronic): Secondary | ICD-10-CM

## 2017-08-27 LAB — CBC WITH DIFFERENTIAL (CANCER CENTER ONLY)
Basophils Absolute: 0 10*3/uL (ref 0.0–0.1)
Basophils Relative: 0 %
Eosinophils Absolute: 0.1 10*3/uL (ref 0.0–0.5)
Eosinophils Relative: 0 %
HCT: 39 % (ref 34.8–46.6)
Hemoglobin: 13.1 g/dL (ref 11.6–15.9)
Lymphocytes Relative: 85 %
Lymphs Abs: 28.6 10*3/uL — ABNORMAL HIGH (ref 0.9–3.3)
MCH: 33.5 pg (ref 26.0–34.0)
MCHC: 33.6 g/dL (ref 32.0–36.0)
MCV: 99.7 fL (ref 81.0–101.0)
Monocytes Absolute: 0.9 10*3/uL (ref 0.1–0.9)
Monocytes Relative: 3 %
Neutro Abs: 4 10*3/uL (ref 1.5–6.5)
Neutrophils Relative %: 12 %
Platelet Count: 203 10*3/uL (ref 145–400)
RBC: 3.91 MIL/uL (ref 3.70–5.32)
RDW: 12.8 % (ref 11.1–15.7)
WBC Count: 33.6 10*3/uL — ABNORMAL HIGH (ref 3.9–10.3)

## 2017-08-27 LAB — COMPREHENSIVE METABOLIC PANEL
ALT: 14 U/L (ref 0–55)
AST: 19 U/L (ref 5–34)
Albumin: 3.8 g/dL (ref 3.5–5.0)
Alkaline Phosphatase: 66 U/L (ref 40–150)
Anion gap: 9 (ref 3–11)
BUN: 15 mg/dL (ref 7–26)
CO2: 28 mmol/L (ref 22–29)
Calcium: 9.6 mg/dL (ref 8.4–10.4)
Chloride: 103 mmol/L (ref 98–109)
Creatinine, Ser: 1.03 mg/dL (ref 0.60–1.10)
GFR calc Af Amer: >60 mL/min (ref >60)
GFR calc non Af Amer: 53 mL/min — ABNORMAL LOW (ref >60)
Glucose, Bld: 85 mg/dL (ref 70–140)
Potassium: 5 mmol/L — ABNORMAL HIGH (ref 3.3–4.7)
Sodium: 140 mmol/L (ref 136–145)
Total Bilirubin: 0.7 mg/dL (ref 0.2–1.2)
Total Protein: 6.8 g/dL (ref 6.4–8.3)

## 2017-08-27 LAB — IRON AND TIBC
Iron: 124 ug/dL (ref 41–142)
Saturation Ratios: 50 % (ref 21–57)
TIBC: 249 ug/dL (ref 236–444)
UIBC: 125 ug/dL

## 2017-08-27 LAB — FERRITIN: Ferritin: 46 ng/mL (ref 9–269)

## 2017-08-27 LAB — SAVE SMEAR

## 2017-08-27 NOTE — Progress Notes (Signed)
Hematology and Oncology Follow Up Visit  Tamara Powell [id number] January 05, 1946 72 y.o. 08/27/2017   Principle Diagnosis:  Stage A CLL Iron deficiency anemia   Current Therapy:   Observation IV iron as indicated    Interim History:  [name] is here today for follow-up.  She is doing well.  We last saw her back in May.  Since then, she really has had no problems.  She has had no issues with nausea or vomiting.  Has had no fever.  She had no infections.  She has had no swollen lymph nodes.  She has had no fatigue or weakness.  She did have a mammogram back in December.  I do not have the results.  However, she says that the mammogram was normal.  She has had no leg swelling.  She has had no rashes.  She is had no headache.  ECOG Performance Status: 1 - Symptomatic but completely ambulatory  Medications:  Allergies as of 08/27/2017      Reactions   Alendronate Sodium Other (See Comments)   Jaw and knee swelling.      Medication List        Accurate as of 08/27/17 11:10 AM. Always use your most recent med list.          aspirin 81 MG tablet Take 81 mg by mouth daily.   calcium carbonate 600 MG Tabs tablet Commonly known as:  OS-CAL Take 600 mg by mouth 2 (two) times daily with a meal.   co-enzyme Q-10 30 MG capsule Take 100 mg by mouth 2 (two) times daily.   Evening Primrose Oil 500 MG Caps Take 500 mg by mouth 2 (two) times daily.   fish oil-omega-3 fatty acids 1000 MG capsule Take 1 g by mouth 2 (two) times daily.   fluticasone 50 MCG/ACT nasal spray Commonly known as:  FLONASE Place 1 spray into the nose as needed.   Grapeseed Extract 500-50 MG Caps Take by mouth 2 (two) times daily.   GREEN TEA SLIM WITH EGCG PO Take by mouth.   ketoconazole 2 % cream Commonly known as:  NIZORAL   multivitamin tablet Take 1 tablet by mouth daily.   OVER THE COUNTER MEDICATION Take by mouth 2 (two) times daily. WELLNESS FORMULA CAPSULE--HAIR SKIN NAILS CAPSULES.   Turmeric 450 MG Caps Take by mouth every morning.   UNABLE TO FIND Take by mouth every morning. Quercetin   & a  E 3 live       Allergies:  Allergies  Allergen Reactions  . Alendronate Sodium Other (See Comments)    Jaw and knee swelling.    Past Medical History, Surgical history, Social history, and Family History were reviewed and updated.  Review of Systems: Review of Systems  Constitutional: Negative.   HENT: Negative.   Eyes: Negative.   Respiratory: Negative.   Cardiovascular: Negative.   Gastrointestinal: Negative.   Genitourinary: Negative.   Musculoskeletal: Negative.   Skin: Negative.   Neurological: Negative.   Endo/Heme/Allergies: Negative.   Psychiatric/Behavioral: Negative.       Physical Exam:  weight is 160 lb (72.6 kg). Her oral temperature is 98.2 F (36.8 C). Her blood pressure is 116/49 (abnormal) and her pulse is 90. Her respiration is 16 and oxygen saturation is 98%.   Wt Readings from Last 3 Encounters:  08/27/17 160 lb (72.6 kg)  12/25/16 161 lb 1.9 oz (73.1 kg)  11/11/16 169 lb 12.8 oz (77 kg)    Physical Exam  Constitutional: She is oriented to person, place, and time.  HENT:  Head: Normocephalic and atraumatic.  Mouth/Throat: Oropharynx is clear and moist.  Eyes: EOM are normal. Pupils are equal, round, and reactive to light.  Neck: Normal range of motion.  Cardiovascular: Normal rate, regular rhythm and normal heart sounds.  Pulmonary/Chest: Effort normal and breath sounds normal.  Abdominal: Soft. Bowel sounds are normal.  Musculoskeletal: Normal range of motion. She exhibits no edema, tenderness or deformity.  Lymphadenopathy:    She has no cervical adenopathy.  Neurological: She is alert and oriented to person, place, and time.  Skin: Skin is warm and dry. No rash noted. No erythema.  Psychiatric: She has a normal mood and affect. Her behavior is normal. Judgment and thought content normal.  Vitals reviewed.   Lab  Results  Component Value Date   WBC 33.6 (H) 08/27/2017   HGB 13.2 12/25/2016   HCT 39.0 08/27/2017   MCV 99.7 08/27/2017   PLT 203 08/27/2017   Lab Results  Component Value Date   FERRITIN 92 12/25/2016   IRON 120 12/25/2016   TIBC 233 (L) 12/25/2016   UIBC 112 (L) 12/25/2016   IRONPCTSAT 52 12/25/2016   Lab Results  Component Value Date   RETICCTPCT 1.5 12/17/2010   RBC 3.91 08/27/2017   RETICCTABS 58.2 12/17/2010   No results found for: Nils Pyle, [facility] Lab Results  Component Value Date   IGGSERUM 600 (L) 05/02/2014   IGA 94 05/02/2014   IGMSERUM 26 (L) 05/02/2014   Lab Results  Component Value Date   TOTALPROTELP 6.1 12/17/2010   ALBUMINELP 66.1 12/17/2010   A1GS 3.6 12/17/2010   A2GS 8.8 12/17/2010   BETS 5.8 12/17/2010   BETA2SER 3.9 12/17/2010   GAMS 11.8 12/17/2010   MSPIKE NOT DET 12/17/2010   SPEI * 12/17/2010     Chemistry      Component Value Date/Time   NA 139 11/11/2016 1426   NA 142 08/30/2015 0941   K 4.1 11/11/2016 1426   K 4.9 08/30/2015 0941   CL 103 11/11/2016 1426   CO2 28 11/11/2016 1426   CO2 27 08/30/2015 0941   BUN 12 11/11/2016 1426   BUN 11.8 08/30/2015 0941   CREATININE 0.9 11/11/2016 1426   CREATININE 1.0 08/30/2015 0941      Component Value Date/Time   CALCIUM 9.4 11/11/2016 1426   CALCIUM 9.5 08/30/2015 0941   ALKPHOS 65 11/11/2016 1426   ALKPHOS 62 08/30/2015 0941   AST 26 11/11/2016 1426   AST 19 08/30/2015 0941   ALT 20 11/11/2016 1426   ALT 13 08/30/2015 0941   BILITOT 0.90 11/11/2016 1426   BILITOT 1.25 (H) 08/30/2015 0941      Impression and Plan: [name] is a very pleasant 72 yo caucasian female with CLL as well as iron deficiency anemia.   Everything looks quite good with respect to her CLL.  I really do not see any problems with her CLL becoming active.  There is been no issues with her iron.  We will see what her iron levels are.  We will plan to get her back in 6 months.  I  think this would be reasonable.   [name], MD 1/23/201911:10 AM

## 2017-09-24 DIAGNOSIS — R1010 Upper abdominal pain, unspecified: Secondary | ICD-10-CM | POA: Diagnosis not present

## 2017-09-24 DIAGNOSIS — M609 Myositis, unspecified: Secondary | ICD-10-CM | POA: Diagnosis not present

## 2017-09-24 DIAGNOSIS — R1084 Generalized abdominal pain: Secondary | ICD-10-CM | POA: Diagnosis not present

## 2017-09-25 DIAGNOSIS — K8050 Calculus of bile duct without cholangitis or cholecystitis without obstruction: Secondary | ICD-10-CM | POA: Diagnosis not present

## 2017-09-25 DIAGNOSIS — K8020 Calculus of gallbladder without cholecystitis without obstruction: Secondary | ICD-10-CM | POA: Diagnosis not present

## 2017-09-25 DIAGNOSIS — C9110 Chronic lymphocytic leukemia of B-cell type not having achieved remission: Secondary | ICD-10-CM | POA: Diagnosis not present

## 2017-09-25 DIAGNOSIS — R0683 Snoring: Secondary | ICD-10-CM | POA: Diagnosis not present

## 2017-09-25 DIAGNOSIS — R1011 Right upper quadrant pain: Secondary | ICD-10-CM | POA: Diagnosis not present

## 2017-09-25 DIAGNOSIS — K219 Gastro-esophageal reflux disease without esophagitis: Secondary | ICD-10-CM | POA: Diagnosis not present

## 2017-09-25 DIAGNOSIS — K8012 Calculus of gallbladder with acute and chronic cholecystitis without obstruction: Secondary | ICD-10-CM | POA: Diagnosis not present

## 2017-09-26 DIAGNOSIS — K219 Gastro-esophageal reflux disease without esophagitis: Secondary | ICD-10-CM | POA: Diagnosis not present

## 2017-09-26 DIAGNOSIS — K8020 Calculus of gallbladder without cholecystitis without obstruction: Secondary | ICD-10-CM | POA: Diagnosis not present

## 2017-09-26 DIAGNOSIS — K8010 Calculus of gallbladder with chronic cholecystitis without obstruction: Secondary | ICD-10-CM | POA: Diagnosis not present

## 2017-09-26 DIAGNOSIS — K8012 Calculus of gallbladder with acute and chronic cholecystitis without obstruction: Secondary | ICD-10-CM | POA: Diagnosis not present

## 2017-11-10 DIAGNOSIS — S3991XA Unspecified injury of abdomen, initial encounter: Secondary | ICD-10-CM | POA: Diagnosis not present

## 2017-11-10 DIAGNOSIS — M545 Low back pain: Secondary | ICD-10-CM | POA: Diagnosis not present

## 2017-11-10 DIAGNOSIS — R109 Unspecified abdominal pain: Secondary | ICD-10-CM | POA: Diagnosis not present

## 2017-11-10 DIAGNOSIS — Z79899 Other long term (current) drug therapy: Secondary | ICD-10-CM | POA: Diagnosis not present

## 2017-11-10 DIAGNOSIS — S32010A Wedge compression fracture of first lumbar vertebra, initial encounter for closed fracture: Secondary | ICD-10-CM | POA: Diagnosis not present

## 2017-11-10 DIAGNOSIS — Z856 Personal history of leukemia: Secondary | ICD-10-CM | POA: Diagnosis not present

## 2017-11-13 DIAGNOSIS — M545 Low back pain: Secondary | ICD-10-CM | POA: Diagnosis not present

## 2017-11-13 DIAGNOSIS — M8008XA Age-related osteoporosis with current pathological fracture, vertebra(e), initial encounter for fracture: Secondary | ICD-10-CM | POA: Diagnosis not present

## 2017-11-14 DIAGNOSIS — M8008XA Age-related osteoporosis with current pathological fracture, vertebra(e), initial encounter for fracture: Secondary | ICD-10-CM | POA: Diagnosis not present

## 2017-12-09 DIAGNOSIS — M1990 Unspecified osteoarthritis, unspecified site: Secondary | ICD-10-CM | POA: Diagnosis not present

## 2017-12-09 DIAGNOSIS — M8580 Other specified disorders of bone density and structure, unspecified site: Secondary | ICD-10-CM | POA: Diagnosis not present

## 2017-12-09 DIAGNOSIS — Z6823 Body mass index (BMI) 23.0-23.9, adult: Secondary | ICD-10-CM | POA: Diagnosis not present

## 2017-12-09 DIAGNOSIS — M5431 Sciatica, right side: Secondary | ICD-10-CM | POA: Diagnosis not present

## 2017-12-16 DIAGNOSIS — M549 Dorsalgia, unspecified: Secondary | ICD-10-CM | POA: Diagnosis not present

## 2017-12-16 DIAGNOSIS — S32020D Wedge compression fracture of second lumbar vertebra, subsequent encounter for fracture with routine healing: Secondary | ICD-10-CM | POA: Diagnosis not present

## 2017-12-16 DIAGNOSIS — Z6825 Body mass index (BMI) 25.0-25.9, adult: Secondary | ICD-10-CM | POA: Diagnosis not present

## 2017-12-29 DIAGNOSIS — J209 Acute bronchitis, unspecified: Secondary | ICD-10-CM | POA: Diagnosis not present

## 2018-01-12 ENCOUNTER — Other Ambulatory Visit: Payer: Self-pay

## 2018-01-12 ENCOUNTER — Encounter: Payer: Self-pay | Admitting: Family

## 2018-01-12 ENCOUNTER — Inpatient Hospital Stay: Payer: Medicare Other

## 2018-01-12 ENCOUNTER — Inpatient Hospital Stay: Payer: Medicare Other | Attending: Family | Admitting: Family

## 2018-01-12 VITALS — BP 119/69 | HR 81 | Temp 98.3°F | Resp 19 | Wt 158.0 lb

## 2018-01-12 DIAGNOSIS — D500 Iron deficiency anemia secondary to blood loss (chronic): Secondary | ICD-10-CM

## 2018-01-12 DIAGNOSIS — R6883 Chills (without fever): Secondary | ICD-10-CM | POA: Diagnosis not present

## 2018-01-12 DIAGNOSIS — C9110 Chronic lymphocytic leukemia of B-cell type not having achieved remission: Secondary | ICD-10-CM | POA: Insufficient documentation

## 2018-01-12 DIAGNOSIS — Z9181 History of falling: Secondary | ICD-10-CM | POA: Insufficient documentation

## 2018-01-12 DIAGNOSIS — R05 Cough: Secondary | ICD-10-CM | POA: Insufficient documentation

## 2018-01-12 DIAGNOSIS — E559 Vitamin D deficiency, unspecified: Secondary | ICD-10-CM | POA: Insufficient documentation

## 2018-01-12 DIAGNOSIS — D509 Iron deficiency anemia, unspecified: Secondary | ICD-10-CM | POA: Diagnosis not present

## 2018-01-12 DIAGNOSIS — R5383 Other fatigue: Secondary | ICD-10-CM | POA: Insufficient documentation

## 2018-01-12 DIAGNOSIS — Z79899 Other long term (current) drug therapy: Secondary | ICD-10-CM | POA: Diagnosis not present

## 2018-01-12 DIAGNOSIS — D508 Other iron deficiency anemias: Secondary | ICD-10-CM

## 2018-01-12 LAB — CBC WITH DIFFERENTIAL (CANCER CENTER ONLY)
Basophils Absolute: 0 10*3/uL (ref 0.0–0.1)
Basophils Relative: 0 %
Eosinophils Absolute: 0.2 10*3/uL (ref 0.0–0.5)
Eosinophils Relative: 1 %
HCT: 37.9 % (ref 34.8–46.6)
Hemoglobin: 12.5 g/dL (ref 11.6–15.9)
Lymphocytes Relative: 87 %
Lymphs Abs: 28 10*3/uL — ABNORMAL HIGH (ref 0.9–3.3)
MCH: 32.3 pg (ref 25.1–34.0)
MCHC: 33 g/dL (ref 31.5–36.0)
MCV: 97.9 fL (ref 79.5–101.0)
Monocytes Absolute: 0.4 10*3/uL (ref 0.1–0.9)
Monocytes Relative: 1 %
Neutro Abs: 3.7 10*3/uL (ref 1.5–6.5)
Neutrophils Relative %: 11 %
Platelet Count: 265 10*3/uL (ref 145–400)
RBC: 3.87 MIL/uL (ref 3.70–5.45)
RDW: 13.9 % (ref 11.2–14.5)
WBC Count: 32.3 10*3/uL — ABNORMAL HIGH (ref 3.9–10.3)

## 2018-01-12 LAB — CMP (CANCER CENTER ONLY)
ALT: 11 U/L (ref 0–55)
AST: 20 U/L (ref 5–34)
Albumin: 3.8 g/dL (ref 3.5–5.0)
Alkaline Phosphatase: 76 U/L (ref 40–150)
Anion gap: 8 (ref 3–11)
BUN: 11 mg/dL (ref 7–26)
CO2: 27 mmol/L (ref 22–29)
Calcium: 9.4 mg/dL (ref 8.4–10.4)
Chloride: 103 mmol/L (ref 98–109)
Creatinine: 0.88 mg/dL (ref 0.60–1.10)
GFR, Est AFR Am: >60 mL/min (ref >60)
GFR, Estimated: >60 mL/min (ref >60)
Glucose, Bld: 90 mg/dL (ref 70–140)
Potassium: 4.6 mmol/L (ref 3.5–5.1)
Sodium: 138 mmol/L (ref 136–145)
Total Bilirubin: 0.6 mg/dL (ref 0.2–1.2)
Total Protein: 6.7 g/dL (ref 6.4–8.3)

## 2018-01-12 LAB — IRON AND TIBC
Iron: 94 ug/dL (ref 41–142)
Saturation Ratios: 37 % (ref 21–57)
TIBC: 255 ug/dL (ref 236–444)
UIBC: 161 ug/dL

## 2018-01-12 LAB — FERRITIN: Ferritin: 56 ng/mL (ref 9–269)

## 2018-01-12 LAB — LACTATE DEHYDROGENASE: LDH: 137 U/L (ref 125–245)

## 2018-01-12 NOTE — Progress Notes (Signed)
Hematology and Oncology Follow Up Visit  Tamara Powell [id number] Oct 20, 1945 72 y.o. 01/12/2018   Principle Diagnosis:  Stage A CLL Iron deficiency anemia   Current Therapy:   Observation IV iron as indicated - last received in April 2018   Interim History: Tamara Powell is here today with her husband for follow-up. She is symptomatic with fatigue and chills.  She fell in April and fractured her vertebrae needing kyphoplasty. She is still sore and has to take breaks from prolonged standing as needed.  She was treated for a sinus infection 3 weeks with an antibiotic and is feeling much better. She still has a mild cough with occasional phlegm.  No fever, n/v, rash, hot flashes, night sweats, dizziness, SOB, chest pain, palpitations, abdominal pain or changes in bowel or bladder habits.  No episodes of bleeding, no bruising or petechiae. No lymphadenopathy noted on exam.   ECOG Performance Status: 1 - Symptomatic but completely ambulatory  Medications:  Allergies as of 01/12/2018      Reactions   Alendronate Sodium Other (See Comments)   Jaw and knee swelling.      Medication List        Accurate as of 01/12/18  1:21 PM. Always use your most recent med list.          aspirin 81 MG tablet Take 81 mg by mouth daily.   calcium carbonate 600 MG Tabs tablet Commonly known as:  OS-CAL Take 600 mg by mouth 2 (two) times daily with a meal.   co-enzyme Q-10 30 MG capsule Take 100 mg by mouth 2 (two) times daily.   Evening Primrose Oil 500 MG Caps Take 500 mg by mouth 2 (two) times daily.   fish oil-omega-3 fatty acids 1000 MG capsule Take 1 g by mouth 2 (two) times daily.   fluticasone 50 MCG/ACT nasal spray Commonly known as:  FLONASE Place 1 spray into the nose as needed.   Grapeseed Extract 500-50 MG Caps Take by mouth 2 (two) times daily.   GREEN TEA SLIM WITH EGCG PO Take by mouth.   ketoconazole 2 % cream Commonly known as:  NIZORAL   multivitamin  tablet Take 1 tablet by mouth daily.   OVER THE COUNTER MEDICATION Take by mouth 2 (two) times daily. WELLNESS FORMULA CAPSULE--HAIR SKIN NAILS CAPSULES.   Turmeric 450 MG Caps Take by mouth every morning.   UNABLE TO FIND Take by mouth every morning. Quercetin   & a  E 3 live       Allergies:  Allergies  Allergen Reactions  . Alendronate Sodium Other (See Comments)    Jaw and knee swelling.    Past Medical History, Surgical history, Social history, and Family History were reviewed and updated.  Review of Systems: All other 10 point review of systems is negative.   Physical Exam:  vitals were not taken for this visit.   Wt Readings from Last 3 Encounters:  08/27/17 160 lb (72.6 kg)  12/25/16 161 lb 1.9 oz (73.1 kg)  11/11/16 169 lb 12.8 oz (77 kg)    Ocular: Sclerae unicteric, pupils equal, round and reactive to light Ear-nose-throat: Oropharynx clear, dentition fair Lymphatic: No cervical, supraclavicular or axillary adenopathy Lungs no rales or rhonchi, good excursion bilaterally Heart regular rate and rhythm, no murmur appreciated Abd soft, nontender, positive bowel sounds, no liver or spleen tip palpated on exam, no fluid wave MSK no focal spinal tenderness, no joint edema Neuro: non-focal, well-oriented, appropriate affect Breasts: Deferred  Lab Results  Component Value Date   WBC 33.6 (H) 08/27/2017   HGB 13.1 08/27/2017   HCT 39.0 08/27/2017   MCV 99.7 08/27/2017   PLT 203 08/27/2017   Lab Results  Component Value Date   FERRITIN 46 08/27/2017   IRON 124 08/27/2017   TIBC 249 08/27/2017   UIBC 125 08/27/2017   IRONPCTSAT 50 08/27/2017   Lab Results  Component Value Date   RETICCTPCT 1.5 12/17/2010   RBC 3.91 08/27/2017   RETICCTABS 58.2 12/17/2010   No results found for: KPAFRELGTCHN, LAMBDASER, [facility] Lab Results  Component Value Date   IGGSERUM 600 (L) 05/02/2014   IGA 94 05/02/2014   IGMSERUM 26 (L) 05/02/2014   Lab Results   Component Value Date   TOTALPROTELP 6.1 12/17/2010   ALBUMINELP 66.1 12/17/2010   A1GS 3.6 12/17/2010   A2GS 8.8 12/17/2010   BETS 5.8 12/17/2010   BETA2SER 3.9 12/17/2010   GAMS 11.8 12/17/2010   MSPIKE NOT DET 12/17/2010   SPEI * 12/17/2010     Chemistry      Component Value Date/Time   NA 140 08/27/2017 0953   NA 139 11/11/2016 1426   NA 142 08/30/2015 0941   K 5.0 (H) 08/27/2017 0953   K 4.1 11/11/2016 1426   K 4.9 08/30/2015 0941   CL 103 08/27/2017 0953   CL 103 11/11/2016 1426   CO2 28 08/27/2017 0953   CO2 28 11/11/2016 1426   CO2 27 08/30/2015 0941   BUN 15 08/27/2017 0953   BUN 12 11/11/2016 1426   BUN 11.8 08/30/2015 0941   CREATININE 1.03 08/27/2017 0953   CREATININE 0.9 11/11/2016 1426   CREATININE 1.0 08/30/2015 0941      Component Value Date/Time   CALCIUM 9.6 08/27/2017 0953   CALCIUM 9.4 11/11/2016 1426   CALCIUM 9.5 08/30/2015 0941   ALKPHOS 66 08/27/2017 0953   ALKPHOS 65 11/11/2016 1426   ALKPHOS 62 08/30/2015 0941   AST 19 08/27/2017 0953   AST 26 11/11/2016 1426   AST 19 08/30/2015 0941   ALT 14 08/27/2017 0953   ALT 20 11/11/2016 1426   ALT 13 08/30/2015 0941   BILITOT 0.7 08/27/2017 0953   BILITOT 0.90 11/11/2016 1426   BILITOT 1.25 (H) 08/30/2015 0941      Impression and Plan: Tamara Powell is a very pleasant 72 yo caucasian female with CLL as well as iron deficiency anemia. Her WBC count remains stable at 32.2.  Iron studies and vitamin D level look good.  We will go ahead and plan to see her back in another 6 months for follow-up.  She will contact our office with any questions or concerns. We can certainly see her sooner if need be.   [name], NP 6/10/20191:21 PM

## 2018-01-13 ENCOUNTER — Other Ambulatory Visit: Payer: Self-pay | Admitting: *Deleted

## 2018-01-13 DIAGNOSIS — E559 Vitamin D deficiency, unspecified: Secondary | ICD-10-CM

## 2018-01-13 LAB — BETA 2 MICROGLOBULIN, SERUM: Beta-2 Microglobulin: 1.7 mg/L (ref 0.6–2.4)

## 2018-01-14 DIAGNOSIS — M47816 Spondylosis without myelopathy or radiculopathy, lumbar region: Secondary | ICD-10-CM | POA: Diagnosis not present

## 2018-01-14 LAB — VITAMIN D 25 HYDROXY (VIT D DEFICIENCY, FRACTURES): Vit D, 25-Hydroxy: 63.2 ng/mL (ref 30.0–100.0)

## 2018-01-15 ENCOUNTER — Encounter: Payer: Self-pay | Admitting: Family

## 2018-01-19 DIAGNOSIS — L821 Other seborrheic keratosis: Secondary | ICD-10-CM | POA: Diagnosis not present

## 2018-01-19 DIAGNOSIS — D692 Other nonthrombocytopenic purpura: Secondary | ICD-10-CM | POA: Diagnosis not present

## 2018-01-19 DIAGNOSIS — L570 Actinic keratosis: Secondary | ICD-10-CM | POA: Diagnosis not present

## 2018-01-19 DIAGNOSIS — Z85820 Personal history of malignant melanoma of skin: Secondary | ICD-10-CM | POA: Diagnosis not present

## 2018-01-19 DIAGNOSIS — L814 Other melanin hyperpigmentation: Secondary | ICD-10-CM | POA: Diagnosis not present

## 2018-01-28 DIAGNOSIS — M47816 Spondylosis without myelopathy or radiculopathy, lumbar region: Secondary | ICD-10-CM | POA: Diagnosis not present

## 2018-02-10 DIAGNOSIS — M47816 Spondylosis without myelopathy or radiculopathy, lumbar region: Secondary | ICD-10-CM | POA: Diagnosis not present

## 2018-02-25 ENCOUNTER — Other Ambulatory Visit: Payer: Medicare Other

## 2018-02-25 ENCOUNTER — Ambulatory Visit: Payer: Medicare Other | Admitting: Family

## 2018-03-11 DIAGNOSIS — Z6825 Body mass index (BMI) 25.0-25.9, adult: Secondary | ICD-10-CM | POA: Diagnosis not present

## 2018-03-11 DIAGNOSIS — S32020D Wedge compression fracture of second lumbar vertebra, subsequent encounter for fracture with routine healing: Secondary | ICD-10-CM | POA: Diagnosis not present

## 2018-03-11 DIAGNOSIS — M545 Low back pain: Secondary | ICD-10-CM | POA: Diagnosis not present

## 2018-03-11 DIAGNOSIS — M47816 Spondylosis without myelopathy or radiculopathy, lumbar region: Secondary | ICD-10-CM | POA: Diagnosis not present

## 2018-05-29 DIAGNOSIS — M7918 Myalgia, other site: Secondary | ICD-10-CM | POA: Diagnosis not present

## 2018-05-29 DIAGNOSIS — M47816 Spondylosis without myelopathy or radiculopathy, lumbar region: Secondary | ICD-10-CM | POA: Diagnosis not present

## 2018-05-29 DIAGNOSIS — Z6825 Body mass index (BMI) 25.0-25.9, adult: Secondary | ICD-10-CM | POA: Diagnosis not present

## 2018-05-29 DIAGNOSIS — S32020D Wedge compression fracture of second lumbar vertebra, subsequent encounter for fracture with routine healing: Secondary | ICD-10-CM | POA: Diagnosis not present

## 2018-06-19 DIAGNOSIS — Z23 Encounter for immunization: Secondary | ICD-10-CM | POA: Diagnosis not present

## 2018-07-13 ENCOUNTER — Inpatient Hospital Stay (HOSPITAL_BASED_OUTPATIENT_CLINIC_OR_DEPARTMENT_OTHER): Payer: Medicare Other | Admitting: Family

## 2018-07-13 ENCOUNTER — Inpatient Hospital Stay: Payer: Medicare Other | Attending: Family

## 2018-07-13 ENCOUNTER — Encounter: Payer: Self-pay | Admitting: Family

## 2018-07-13 ENCOUNTER — Other Ambulatory Visit: Payer: Self-pay

## 2018-07-13 VITALS — BP 114/51 | HR 79 | Temp 98.0°F | Resp 19 | Ht 68.0 in | Wt 157.5 lb

## 2018-07-13 DIAGNOSIS — D508 Other iron deficiency anemias: Secondary | ICD-10-CM

## 2018-07-13 DIAGNOSIS — Z79899 Other long term (current) drug therapy: Secondary | ICD-10-CM | POA: Insufficient documentation

## 2018-07-13 DIAGNOSIS — C9110 Chronic lymphocytic leukemia of B-cell type not having achieved remission: Secondary | ICD-10-CM | POA: Insufficient documentation

## 2018-07-13 DIAGNOSIS — D509 Iron deficiency anemia, unspecified: Secondary | ICD-10-CM

## 2018-07-13 DIAGNOSIS — Z7982 Long term (current) use of aspirin: Secondary | ICD-10-CM

## 2018-07-13 LAB — CBC WITH DIFFERENTIAL (CANCER CENTER ONLY)
Abs Immature Granulocytes: 0.03 10*3/uL (ref 0.00–0.07)
Basophils Absolute: 0.1 10*3/uL (ref 0.0–0.1)
Basophils Relative: 0 %
Eosinophils Absolute: 0.1 10*3/uL (ref 0.0–0.5)
Eosinophils Relative: 0 %
HCT: 38.1 % (ref 36.0–46.0)
Hemoglobin: 12.4 g/dL (ref 12.0–15.0)
Immature Granulocytes: 0 %
Lymphocytes Relative: 90 %
Lymphs Abs: 24.8 10*3/uL — ABNORMAL HIGH (ref 0.7–4.0)
MCH: 32.2 pg (ref 26.0–34.0)
MCHC: 32.5 g/dL (ref 30.0–36.0)
MCV: 99 fL (ref 80.0–100.0)
Monocytes Absolute: 0.2 10*3/uL (ref 0.1–1.0)
Monocytes Relative: 1 %
Neutro Abs: 2.6 10*3/uL (ref 1.7–7.7)
Neutrophils Relative %: 9 %
Platelet Count: 181 10*3/uL (ref 150–400)
RBC: 3.85 MIL/uL — ABNORMAL LOW (ref 3.87–5.11)
RDW: 13.3 % (ref 11.5–15.5)
WBC Count: 27.7 10*3/uL — ABNORMAL HIGH (ref 4.0–10.5)
nRBC: 0 % (ref 0.0–0.2)

## 2018-07-13 LAB — CMP (CANCER CENTER ONLY)
ALT: 12 U/L (ref 0–44)
AST: 17 U/L (ref 15–41)
Albumin: 4.3 g/dL (ref 3.5–5.0)
Alkaline Phosphatase: 59 U/L (ref 38–126)
Anion gap: 6 (ref 5–15)
BUN: 9 mg/dL (ref 8–23)
CO2: 29 mmol/L (ref 22–32)
Calcium: 8.8 mg/dL — ABNORMAL LOW (ref 8.9–10.3)
Chloride: 103 mmol/L (ref 98–111)
Creatinine: 0.89 mg/dL (ref 0.44–1.00)
GFR, Est AFR Am: >60 mL/min (ref >60)
GFR, Estimated: >60 mL/min (ref >60)
Glucose, Bld: 97 mg/dL (ref 70–99)
Potassium: 4.4 mmol/L (ref 3.5–5.1)
Sodium: 138 mmol/L (ref 135–145)
Total Bilirubin: 0.9 mg/dL (ref 0.3–1.2)
Total Protein: 6.2 g/dL — ABNORMAL LOW (ref 6.5–8.1)

## 2018-07-13 LAB — LACTATE DEHYDROGENASE: LDH: 135 U/L (ref 98–192)

## 2018-07-13 NOTE — Progress Notes (Signed)
Hematology and Oncology Follow Up Visit  Tamara Powell [id number] 1945-11-04 72 y.o. 07/13/2018   Principle Diagnosis:  Stage A CLL Iron deficiency anemia   Current Therapy:   Observation IV iron as indicated   Interim History:  [name] is here today for follow-up. She is doing well and has no complaints at this time. WBC count is stable at 27.7, Hgb 12.4 and platelet count is 181.  No episodes of bleeding, no bruising or petechiae.  No fever, chills, n/v, cough, rash, dizziness, SOB, chest pain, palpitations, abdominal pain or changes in bowel or bladder habits.  No swelling, tenderness, numbness or tingling in her extremities.  No lymphadenopathy noted on exam.  She has maintained a good appetite and is staying well hydrated. Her weight is stable.   ECOG Performance Status: 1 - Symptomatic but completely ambulatory  Medications:  Allergies as of 07/13/2018      Reactions   Alendronate Other (See Comments)   Edema   Alendronate Sodium Other (See Comments)   Jaw and knee swelling.   Alendronate Sodium Other (See Comments)   Jaw and knee swelling.   Ibandronic Acid Other (See Comments)   Edema      Medication List        Accurate as of 07/13/18 11:40 AM. Always use your most recent med list.          aspirin 81 MG tablet Take 81 mg by mouth daily.   calcium carbonate 600 MG Tabs tablet Commonly known as:  OS-CAL Take 600 mg by mouth 2 (two) times daily with a meal.   co-enzyme Q-10 30 MG capsule Take 100 mg by mouth 2 (two) times daily.   Evening Primrose Oil 500 MG Caps Take 500 mg by mouth 2 (two) times daily.   fish oil-omega-3 fatty acids 1000 MG capsule Take 1 g by mouth 2 (two) times daily.   fluticasone 50 MCG/ACT nasal spray Commonly known as:  FLONASE Place 1 spray into the nose as needed.   Grapeseed Extract 500-50 MG Caps Take by mouth 2 (two) times daily.   GREEN TEA SLIM WITH EGCG PO Take by mouth.   ketoconazole 2 % cream Commonly  known as:  NIZORAL   multivitamin tablet Take 1 tablet by mouth daily.   OVER THE COUNTER MEDICATION Take by mouth 2 (two) times daily. WELLNESS FORMULA CAPSULE--HAIR SKIN NAILS CAPSULES.   Turmeric 450 MG Caps Take by mouth every morning.   UNABLE TO FIND Take by mouth every morning. Quercetin   & a  E 3 live       Allergies:  Allergies  Allergen Reactions  . Alendronate Other (See Comments)    Edema  . Alendronate Sodium Other (See Comments)    Jaw and knee swelling.  . Alendronate Sodium Other (See Comments)    Jaw and knee swelling.  . Ibandronic Acid Other (See Comments)    Edema    Past Medical History, Surgical history, Social history, and Family History were reviewed and updated.  Review of Systems: All other 10 point review of systems is negative.   Physical Exam:  height is 5\' 8"  (1.727 m) and weight is 157 lb 8 oz (71.4 kg). Her oral temperature is 98 F (36.7 C). Her blood pressure is 114/51 (abnormal) and her pulse is 79. Her respiration is 19 and oxygen saturation is 100%.   Wt Readings from Last 3 Encounters:  07/13/18 157 lb 8 oz (71.4 kg)  01/12/18 158  lb (71.7 kg)  08/27/17 160 lb (72.6 kg)    Ocular: Sclerae unicteric, pupils equal, round and reactive to light Ear-nose-throat: Oropharynx clear, dentition fair Lymphatic: No cervical, supraclavicular or axillary adenopathy Lungs no rales or rhonchi, good excursion bilaterally Heart regular rate and rhythm, no murmur appreciated Abd soft, nontender, positive bowel sounds, no liver or spleen tip palpated on exam, no fluid wave  MSK no focal spinal tenderness, no joint edema Neuro: non-focal, well-oriented, appropriate affect Breasts: Deferred  Lab Results  Component Value Date   WBC 27.7 (H) 07/13/2018   HGB 12.4 07/13/2018   HCT 38.1 07/13/2018   MCV 99.0 07/13/2018   PLT 181 07/13/2018   Lab Results  Component Value Date   FERRITIN 56 01/12/2018   IRON 94 01/12/2018   TIBC 255  01/12/2018   UIBC 161 01/12/2018   IRONPCTSAT 37 01/12/2018   Lab Results  Component Value Date   RETICCTPCT 1.5 12/17/2010   RBC 3.85 (L) 07/13/2018   RETICCTABS 58.2 12/17/2010   No results found for: Nils Pyle, [facility] Lab Results  Component Value Date   IGGSERUM 600 (L) 05/02/2014   IGA 94 05/02/2014   IGMSERUM 26 (L) 05/02/2014   Lab Results  Component Value Date   TOTALPROTELP 6.1 12/17/2010   ALBUMINELP 66.1 12/17/2010   A1GS 3.6 12/17/2010   A2GS 8.8 12/17/2010   BETS 5.8 12/17/2010   BETA2SER 3.9 12/17/2010   GAMS 11.8 12/17/2010   MSPIKE NOT DET 12/17/2010   SPEI * 12/17/2010     Chemistry      Component Value Date/Time   NA 138 07/13/2018 1036   NA 139 11/11/2016 1426   NA 142 08/30/2015 0941   K 4.4 07/13/2018 1036   K 4.1 11/11/2016 1426   K 4.9 08/30/2015 0941   CL 103 07/13/2018 1036   CL 103 11/11/2016 1426   CO2 29 07/13/2018 1036   CO2 28 11/11/2016 1426   CO2 27 08/30/2015 0941   BUN 9 07/13/2018 1036   BUN 12 11/11/2016 1426   BUN 11.8 08/30/2015 0941   CREATININE 0.89 07/13/2018 1036   CREATININE 0.9 11/11/2016 1426   CREATININE 1.0 08/30/2015 0941      Component Value Date/Time   CALCIUM 8.8 (L) 07/13/2018 1036   CALCIUM 9.4 11/11/2016 1426   CALCIUM 9.5 08/30/2015 0941   ALKPHOS 59 07/13/2018 1036   ALKPHOS 65 11/11/2016 1426   ALKPHOS 62 08/30/2015 0941   AST 17 07/13/2018 1036   AST 19 08/30/2015 0941   ALT 12 07/13/2018 1036   ALT 20 11/11/2016 1426   ALT 13 08/30/2015 0941   BILITOT 0.9 07/13/2018 1036   BILITOT 1.25 (H) 08/30/2015 0941      Impression and Plan: Tamara Powell is a very pleasant 72 yo caucasian female with CLL as well as iron deficiency anemia. Her Counts so far continue to remain stable. WBC count is 27.7. We will see what her iron studies show and bring her back in for iron infusion if needed.  We will plan to see her back in another 6 months for follow-up.  She will contact our office  with any questions or concerns. We can certainly see her sooner if need be.   [name], NP 12/9/201911:40 AM

## 2018-07-14 LAB — IRON AND TIBC
Iron: 123 ug/dL (ref 41–142)
Saturation Ratios: 49 % (ref 21–57)
TIBC: 250 ug/dL (ref 236–444)
UIBC: 127 ug/dL (ref 120–384)

## 2018-07-14 LAB — BETA 2 MICROGLOBULIN, SERUM: Beta-2 Microglobulin: 2 mg/L (ref 0.6–2.4)

## 2018-07-14 LAB — FERRITIN: Ferritin: 44 ng/mL (ref 11–307)

## 2018-08-13 DIAGNOSIS — Z9181 History of falling: Secondary | ICD-10-CM | POA: Diagnosis not present

## 2018-08-13 DIAGNOSIS — Z0000 Encounter for general adult medical examination without abnormal findings: Secondary | ICD-10-CM | POA: Diagnosis not present

## 2018-08-13 DIAGNOSIS — M1990 Unspecified osteoarthritis, unspecified site: Secondary | ICD-10-CM | POA: Diagnosis not present

## 2018-08-13 DIAGNOSIS — Z79899 Other long term (current) drug therapy: Secondary | ICD-10-CM | POA: Diagnosis not present

## 2018-08-13 DIAGNOSIS — E785 Hyperlipidemia, unspecified: Secondary | ICD-10-CM | POA: Diagnosis not present

## 2018-08-13 DIAGNOSIS — R5383 Other fatigue: Secondary | ICD-10-CM | POA: Diagnosis not present

## 2018-08-13 DIAGNOSIS — Z1331 Encounter for screening for depression: Secondary | ICD-10-CM | POA: Diagnosis not present

## 2018-08-13 DIAGNOSIS — Z6823 Body mass index (BMI) 23.0-23.9, adult: Secondary | ICD-10-CM | POA: Diagnosis not present

## 2018-08-18 DIAGNOSIS — M8589 Other specified disorders of bone density and structure, multiple sites: Secondary | ICD-10-CM | POA: Diagnosis not present

## 2018-08-18 DIAGNOSIS — Z1231 Encounter for screening mammogram for malignant neoplasm of breast: Secondary | ICD-10-CM | POA: Diagnosis not present

## 2018-10-05 DIAGNOSIS — H524 Presbyopia: Secondary | ICD-10-CM | POA: Diagnosis not present

## 2018-10-05 DIAGNOSIS — H2513 Age-related nuclear cataract, bilateral: Secondary | ICD-10-CM | POA: Diagnosis not present

## 2018-10-05 DIAGNOSIS — H02833 Dermatochalasis of right eye, unspecified eyelid: Secondary | ICD-10-CM | POA: Diagnosis not present

## 2018-10-05 DIAGNOSIS — H02836 Dermatochalasis of left eye, unspecified eyelid: Secondary | ICD-10-CM | POA: Diagnosis not present

## 2018-12-07 DIAGNOSIS — M810 Age-related osteoporosis without current pathological fracture: Secondary | ICD-10-CM | POA: Diagnosis not present

## 2019-01-11 ENCOUNTER — Inpatient Hospital Stay: Payer: Medicare Other | Attending: Hematology & Oncology

## 2019-01-11 ENCOUNTER — Other Ambulatory Visit: Payer: Self-pay

## 2019-01-11 ENCOUNTER — Encounter: Payer: Self-pay | Admitting: Hematology & Oncology

## 2019-01-11 ENCOUNTER — Inpatient Hospital Stay (HOSPITAL_BASED_OUTPATIENT_CLINIC_OR_DEPARTMENT_OTHER): Payer: Medicare Other | Admitting: Hematology & Oncology

## 2019-01-11 VITALS — BP 128/65 | HR 73 | Temp 97.9°F | Resp 18 | Ht 68.0 in | Wt 164.0 lb

## 2019-01-11 DIAGNOSIS — Z79899 Other long term (current) drug therapy: Secondary | ICD-10-CM | POA: Insufficient documentation

## 2019-01-11 DIAGNOSIS — D509 Iron deficiency anemia, unspecified: Secondary | ICD-10-CM | POA: Diagnosis not present

## 2019-01-11 DIAGNOSIS — Z7982 Long term (current) use of aspirin: Secondary | ICD-10-CM | POA: Diagnosis not present

## 2019-01-11 DIAGNOSIS — D508 Other iron deficiency anemias: Secondary | ICD-10-CM

## 2019-01-11 DIAGNOSIS — Z7951 Long term (current) use of inhaled steroids: Secondary | ICD-10-CM

## 2019-01-11 DIAGNOSIS — C9110 Chronic lymphocytic leukemia of B-cell type not having achieved remission: Secondary | ICD-10-CM

## 2019-01-11 LAB — CMP (CANCER CENTER ONLY)
ALT: 14 U/L (ref 0–44)
AST: 18 U/L (ref 15–41)
Albumin: 4.5 g/dL (ref 3.5–5.0)
Alkaline Phosphatase: 60 U/L (ref 38–126)
Anion gap: 8 (ref 5–15)
BUN: 11 mg/dL (ref 8–23)
CO2: 29 mmol/L (ref 22–32)
Calcium: 9.1 mg/dL (ref 8.9–10.3)
Chloride: 101 mmol/L (ref 98–111)
Creatinine: 0.9 mg/dL (ref 0.44–1.00)
GFR, Est AFR Am: >60 mL/min (ref >60)
GFR, Estimated: >60 mL/min (ref >60)
Glucose, Bld: 93 mg/dL (ref 70–99)
Potassium: 4 mmol/L (ref 3.5–5.1)
Sodium: 138 mmol/L (ref 135–145)
Total Bilirubin: 0.6 mg/dL (ref 0.3–1.2)
Total Protein: 6.5 g/dL (ref 6.5–8.1)

## 2019-01-11 LAB — CBC WITH DIFFERENTIAL (CANCER CENTER ONLY)
Abs Immature Granulocytes: 0.03 10*3/uL (ref 0.00–0.07)
Basophils Absolute: 0 10*3/uL (ref 0.0–0.1)
Basophils Relative: 0 %
Eosinophils Absolute: 0.1 10*3/uL (ref 0.0–0.5)
Eosinophils Relative: 1 %
HCT: 37.9 % (ref 36.0–46.0)
Hemoglobin: 12.5 g/dL (ref 12.0–15.0)
Immature Granulocytes: 0 %
Lymphocytes Relative: 90 %
Lymphs Abs: 23.8 10*3/uL — ABNORMAL HIGH (ref 0.7–4.0)
MCH: 32 pg (ref 26.0–34.0)
MCHC: 33 g/dL (ref 30.0–36.0)
MCV: 96.9 fL (ref 80.0–100.0)
Monocytes Absolute: 0.3 10*3/uL (ref 0.1–1.0)
Monocytes Relative: 1 %
Neutro Abs: 2.1 10*3/uL (ref 1.7–7.7)
Neutrophils Relative %: 8 %
Platelet Count: 162 10*3/uL (ref 150–400)
RBC: 3.91 MIL/uL (ref 3.87–5.11)
RDW: 13.1 % (ref 11.5–15.5)
WBC Count: 26.4 10*3/uL — ABNORMAL HIGH (ref 4.0–10.5)
nRBC: 0 % (ref 0.0–0.2)

## 2019-01-11 LAB — IRON AND TIBC
Iron: 49 ug/dL (ref 41–142)
Saturation Ratios: 18 % — ABNORMAL LOW (ref 21–57)
TIBC: 276 ug/dL (ref 236–444)
UIBC: 227 ug/dL (ref 120–384)

## 2019-01-11 LAB — FERRITIN: Ferritin: 30 ng/mL (ref 11–307)

## 2019-01-11 LAB — LACTATE DEHYDROGENASE: LDH: 141 U/L (ref 98–192)

## 2019-01-11 NOTE — Progress Notes (Signed)
Hematology and Oncology Follow Up Visit  Tamara Powell [id number] Jan 04, 1946 73 y.o. 01/11/2019   Principle Diagnosis:  Stage A CLL Iron deficiency anemia   Current Therapy:   Observation IV iron as indicated    Interim History:  Tamara Powell is here today for follow-up.  She is her left knee.  She does see somebody in sports medicine.  I think she lives down in the [place] area so will be easier for her to see orthopedic surgery down in [place].  She did have a mammogram done in January.  She reports that this was negative.  She has had no problems with swollen lymph nodes.  There is been no weight loss.  She has had no fever.  There is been no change in bowel or bladder habits.  She has had no rashes.  There is been no leg swelling.  Overall, her performance status is ECOG 1.   Medications:  Allergies as of 01/11/2019      Reactions   Alendronate Swelling   Ibandronic Acid Swelling      Medication List       Accurate as of January 11, 2019 10:04 AM. If you have any questions, ask your nurse or doctor.        STOP taking these medications   fluticasone 50 MCG/ACT nasal spray Commonly known as:  FLONASE Stopped by:  [name], MD   ketoconazole 2 % cream Commonly known as:  NIZORAL Stopped by:  [name], MD     TAKE these medications   aspirin 81 MG tablet Take 81 mg by mouth daily.   calcium carbonate 600 MG Tabs tablet Commonly known as:  OS-CAL Take 600 mg by mouth 2 (two) times daily with a meal.   co-enzyme Q-10 30 MG capsule Take 100 mg by mouth 2 (two) times daily.   Evening Primrose Oil 500 MG Caps Take 500 mg by mouth 2 (two) times daily.   fish oil-omega-3 fatty acids 1000 MG capsule Take 1 g by mouth 2 (two) times daily.   Grapeseed Extract 500-50 MG Caps Take by mouth 2 (two) times daily.   GREEN TEA SLIM WITH EGCG PO Take by mouth.   multivitamin tablet Take 1 tablet by mouth daily.   OVER THE COUNTER MEDICATION Take by  mouth 2 (two) times daily. WELLNESS FORMULA CAPSULE--HAIR SKIN NAILS CAPSULES.   Turmeric 450 MG Caps Take by mouth every morning.   UNABLE TO FIND Take by mouth every morning. Quercetin   & a  E 3 live       Allergies:  Allergies  Allergen Reactions  . Alendronate Swelling  . Ibandronic Acid Swelling    Past Medical History, Surgical history, Social history, and Family History were reviewed and updated.  Review of Systems: Review of Systems  Constitutional: Negative.   HENT: Negative.   Eyes: Negative.   Respiratory: Negative.   Cardiovascular: Negative.   Gastrointestinal: Negative.   Genitourinary: Negative.   Musculoskeletal: Negative.   Skin: Negative.   Neurological: Negative.   Endo/Heme/Allergies: Negative.   Psychiatric/Behavioral: Negative.       Physical Exam:  height is 5\' 8"  (1.727 m) and weight is 164 lb (74.4 kg). Her oral temperature is 97.9 F (36.6 C). Her blood pressure is 128/65 and her pulse is 73. Her respiration is 18 and oxygen saturation is 100%.   Wt Readings from Last 3 Encounters:  01/11/19 164 lb (74.4 kg)  07/13/18 157 lb 8  oz (71.4 kg)  01/12/18 158 lb (71.7 kg)    Physical Exam Vitals signs reviewed.  HENT:     Head: Normocephalic and atraumatic.  Eyes:     Pupils: Pupils are equal, round, and reactive to light.  Neck:     Musculoskeletal: Normal range of motion.  Cardiovascular:     Rate and Rhythm: Normal rate and regular rhythm.     Heart sounds: Normal heart sounds.  Pulmonary:     Effort: Pulmonary effort is normal.     Breath sounds: Normal breath sounds.  Abdominal:     General: Bowel sounds are normal.     Palpations: Abdomen is soft.  Musculoskeletal: Normal range of motion.        General: No tenderness or deformity.  Lymphadenopathy:     Cervical: No cervical adenopathy.  Skin:    General: Skin is warm and dry.     Findings: No erythema or rash.  Neurological:     Mental Status: She is alert and  oriented to person, place, and time.  Psychiatric:        Behavior: Behavior normal.        Thought Content: Thought content normal.        Judgment: Judgment normal.     Lab Results  Component Value Date   WBC 26.4 (H) 01/11/2019   HGB 12.5 01/11/2019   HCT 37.9 01/11/2019   MCV 96.9 01/11/2019   PLT 162 01/11/2019   Lab Results  Component Value Date   FERRITIN 44 07/13/2018   IRON 123 07/13/2018   TIBC 250 07/13/2018   UIBC 127 07/13/2018   IRONPCTSAT 49 07/13/2018   Lab Results  Component Value Date   RETICCTPCT 1.5 12/17/2010   RBC 3.91 01/11/2019   RETICCTABS 58.2 12/17/2010   No results found for: Nils Pyle, [facility] Lab Results  Component Value Date   IGGSERUM 600 (L) 05/02/2014   IGA 94 05/02/2014   IGMSERUM 26 (L) 05/02/2014   Lab Results  Component Value Date   TOTALPROTELP 6.1 12/17/2010   ALBUMINELP 66.1 12/17/2010   A1GS 3.6 12/17/2010   A2GS 8.8 12/17/2010   BETS 5.8 12/17/2010   BETA2SER 3.9 12/17/2010   GAMS 11.8 12/17/2010   MSPIKE NOT DET 12/17/2010   SPEI * 12/17/2010     Chemistry      Component Value Date/Time   NA 138 07/13/2018 1036   NA 139 11/11/2016 1426   NA 142 08/30/2015 0941   K 4.4 07/13/2018 1036   K 4.1 11/11/2016 1426   K 4.9 08/30/2015 0941   CL 103 07/13/2018 1036   CL 103 11/11/2016 1426   CO2 29 07/13/2018 1036   CO2 28 11/11/2016 1426   CO2 27 08/30/2015 0941   BUN 9 07/13/2018 1036   BUN 12 11/11/2016 1426   BUN 11.8 08/30/2015 0941   CREATININE 0.89 07/13/2018 1036   CREATININE 0.9 11/11/2016 1426   CREATININE 1.0 08/30/2015 0941      Component Value Date/Time   CALCIUM 8.8 (L) 07/13/2018 1036   CALCIUM 9.4 11/11/2016 1426   CALCIUM 9.5 08/30/2015 0941   ALKPHOS 59 07/13/2018 1036   ALKPHOS 65 11/11/2016 1426   ALKPHOS 62 08/30/2015 0941   AST 17 07/13/2018 1036   AST 19 08/30/2015 0941   ALT 12 07/13/2018 1036   ALT 20 11/11/2016 1426   ALT 13 08/30/2015 0941   BILITOT 0.9  07/13/2018 1036   BILITOT 1.25 (H) 08/30/2015 6659  Impression and Plan: Tamara Powell is a very pleasant 73 yo caucasian female with CLL as well as iron deficiency anemia.   Everything looks quite good with respect to her CLL.  I really do not see any problems with her CLL becoming active.  There is been no issues with her iron.  We will see what her iron levels are.  We will plan to get her back in 6 months.  I think this would be reasonable.  If her white cell count is still stable in 6 months, then maybe we can move her appointments out to yearly.  [name], MD 6/8/202010:04 AM

## 2019-01-12 LAB — BETA 2 MICROGLOBULIN, SERUM: Beta-2 Microglobulin: 2.6 mg/L — ABNORMAL HIGH (ref 0.6–2.4)

## 2019-01-22 DIAGNOSIS — C44319 Basal cell carcinoma of skin of other parts of face: Secondary | ICD-10-CM | POA: Diagnosis not present

## 2019-01-22 DIAGNOSIS — Z85820 Personal history of malignant melanoma of skin: Secondary | ICD-10-CM | POA: Diagnosis not present

## 2019-01-22 DIAGNOSIS — Z85828 Personal history of other malignant neoplasm of skin: Secondary | ICD-10-CM | POA: Diagnosis not present

## 2019-01-22 DIAGNOSIS — L821 Other seborrheic keratosis: Secondary | ICD-10-CM | POA: Diagnosis not present

## 2019-01-22 DIAGNOSIS — C4441 Basal cell carcinoma of skin of scalp and neck: Secondary | ICD-10-CM | POA: Diagnosis not present

## 2019-01-22 DIAGNOSIS — D1722 Benign lipomatous neoplasm of skin and subcutaneous tissue of left arm: Secondary | ICD-10-CM | POA: Diagnosis not present

## 2019-01-22 DIAGNOSIS — L570 Actinic keratosis: Secondary | ICD-10-CM | POA: Diagnosis not present

## 2019-01-22 DIAGNOSIS — D1801 Hemangioma of skin and subcutaneous tissue: Secondary | ICD-10-CM | POA: Diagnosis not present

## 2019-02-04 DIAGNOSIS — S8392XA Sprain of unspecified site of left knee, initial encounter: Secondary | ICD-10-CM | POA: Diagnosis not present

## 2019-02-18 DIAGNOSIS — H903 Sensorineural hearing loss, bilateral: Secondary | ICD-10-CM | POA: Diagnosis not present

## 2019-02-18 DIAGNOSIS — H905 Unspecified sensorineural hearing loss: Secondary | ICD-10-CM | POA: Diagnosis not present

## 2019-02-18 DIAGNOSIS — H9311 Tinnitus, right ear: Secondary | ICD-10-CM | POA: Diagnosis not present

## 2019-02-22 DIAGNOSIS — M25562 Pain in left knee: Secondary | ICD-10-CM | POA: Diagnosis not present

## 2019-03-01 DIAGNOSIS — M545 Low back pain: Secondary | ICD-10-CM | POA: Diagnosis not present

## 2019-03-01 DIAGNOSIS — M47816 Spondylosis without myelopathy or radiculopathy, lumbar region: Secondary | ICD-10-CM | POA: Diagnosis not present

## 2019-03-05 DIAGNOSIS — Z6823 Body mass index (BMI) 23.0-23.9, adult: Secondary | ICD-10-CM | POA: Diagnosis not present

## 2019-03-05 DIAGNOSIS — N3001 Acute cystitis with hematuria: Secondary | ICD-10-CM | POA: Diagnosis not present

## 2019-05-31 DIAGNOSIS — Z23 Encounter for immunization: Secondary | ICD-10-CM | POA: Diagnosis not present

## 2019-06-11 DIAGNOSIS — Z6824 Body mass index (BMI) 24.0-24.9, adult: Secondary | ICD-10-CM | POA: Diagnosis not present

## 2019-06-11 DIAGNOSIS — M79672 Pain in left foot: Secondary | ICD-10-CM | POA: Diagnosis not present

## 2019-07-12 ENCOUNTER — Inpatient Hospital Stay: Payer: Medicare Other | Attending: Hematology & Oncology | Admitting: Hematology & Oncology

## 2019-07-12 ENCOUNTER — Encounter: Payer: Self-pay | Admitting: Hematology & Oncology

## 2019-07-12 ENCOUNTER — Other Ambulatory Visit: Payer: Self-pay

## 2019-07-12 ENCOUNTER — Inpatient Hospital Stay: Payer: Medicare Other

## 2019-07-12 VITALS — BP 147/54 | HR 73 | Temp 97.1°F | Resp 18 | Wt 146.0 lb

## 2019-07-12 DIAGNOSIS — C9110 Chronic lymphocytic leukemia of B-cell type not having achieved remission: Secondary | ICD-10-CM | POA: Diagnosis not present

## 2019-07-12 DIAGNOSIS — Z79899 Other long term (current) drug therapy: Secondary | ICD-10-CM | POA: Diagnosis not present

## 2019-07-12 DIAGNOSIS — Z7982 Long term (current) use of aspirin: Secondary | ICD-10-CM | POA: Insufficient documentation

## 2019-07-12 DIAGNOSIS — D509 Iron deficiency anemia, unspecified: Secondary | ICD-10-CM | POA: Insufficient documentation

## 2019-07-12 LAB — CMP (CANCER CENTER ONLY)
ALT: 11 U/L (ref 0–44)
AST: 16 U/L (ref 15–41)
Albumin: 4.6 g/dL (ref 3.5–5.0)
Alkaline Phosphatase: 47 U/L (ref 38–126)
Anion gap: 6 (ref 5–15)
BUN: 9 mg/dL (ref 8–23)
CO2: 29 mmol/L (ref 22–32)
Calcium: 9 mg/dL (ref 8.9–10.3)
Chloride: 104 mmol/L (ref 98–111)
Creatinine: 0.89 mg/dL (ref 0.44–1.00)
GFR, Est AFR Am: >60 mL/min
GFR, Estimated: >60 mL/min
Glucose, Bld: 95 mg/dL (ref 70–99)
Potassium: 4.2 mmol/L (ref 3.5–5.1)
Sodium: 139 mmol/L (ref 135–145)
Total Bilirubin: 1 mg/dL (ref 0.3–1.2)
Total Protein: 6.4 g/dL — ABNORMAL LOW (ref 6.5–8.1)

## 2019-07-12 LAB — CBC WITH DIFFERENTIAL (CANCER CENTER ONLY)
Abs Immature Granulocytes: 0.01 10*3/uL (ref 0.00–0.07)
Basophils Absolute: 0 10*3/uL (ref 0.0–0.1)
Basophils Relative: 0 %
Eosinophils Absolute: 0.2 10*3/uL (ref 0.0–0.5)
Eosinophils Relative: 1 %
HCT: 38.2 % (ref 36.0–46.0)
Hemoglobin: 12.9 g/dL (ref 12.0–15.0)
Immature Granulocytes: 0 %
Lymphocytes Relative: 92 %
Lymphs Abs: 23.8 10*3/uL — ABNORMAL HIGH (ref 0.7–4.0)
MCH: 32.5 pg (ref 26.0–34.0)
MCHC: 33.8 g/dL (ref 30.0–36.0)
MCV: 96.2 fL (ref 80.0–100.0)
Monocytes Absolute: 0.2 10*3/uL (ref 0.1–1.0)
Monocytes Relative: 1 %
Neutro Abs: 1.7 10*3/uL (ref 1.7–7.7)
Neutrophils Relative %: 6 %
Platelet Count: 154 10*3/uL (ref 150–400)
RBC: 3.97 MIL/uL (ref 3.87–5.11)
RDW: 13.4 % (ref 11.5–15.5)
WBC Count: 25.9 10*3/uL — ABNORMAL HIGH (ref 4.0–10.5)
nRBC: 0 % (ref 0.0–0.2)

## 2019-07-12 LAB — SAVE SMEAR(SSMR), FOR PROVIDER SLIDE REVIEW

## 2019-07-12 NOTE — Progress Notes (Signed)
Hematology and Oncology Follow Up Visit  Tamara Powell [id number] 09-Sep-1945 73 y.o. 07/12/2019   Principle Diagnosis:  Stage A CLL Iron deficiency anemia   Current Therapy:   Observation IV iron as indicated    Interim History:  Tamara Powell is here today for follow-up.  She is feeling pretty well.  She really has had no specific complaints.  We last saw her 6 months ago.  She is doing her best to avoid the coronavirus.  She has had no fever.  She has had no cough.  She has had no change in bowel or bladder habits.  There is been no nausea or vomiting.  She has had no rashes.  Her left knee still causes some problems for her.  Overall, her performance status is ECOG 1.   Medications:  Allergies as of 07/12/2019      Reactions   Alendronate Swelling   Ibandronic Acid Swelling      Medication List       Accurate as of July 12, 2019  9:49 AM. If you have any questions, ask your nurse or doctor.        aspirin 81 MG tablet Take 81 mg by mouth daily.   calcium carbonate 600 MG Tabs tablet Commonly known as: OS-CAL Take 600 mg by mouth 2 (two) times daily with a meal.   co-enzyme Q-10 30 MG capsule Take 100 mg by mouth 2 (two) times daily.   Evening Primrose Oil 500 MG Caps Take 500 mg by mouth 2 (two) times daily.   fish oil-omega-3 fatty acids 1000 MG capsule Take 1 g by mouth 2 (two) times daily.   Grapeseed Extract 500-50 MG Caps Take by mouth 2 (two) times daily.   GREEN TEA SLIM WITH EGCG PO Take by mouth.   multivitamin tablet Take 1 tablet by mouth daily.   OVER THE COUNTER MEDICATION Take by mouth 2 (two) times daily. WELLNESS FORMULA CAPSULE--HAIR SKIN NAILS CAPSULES.   Turmeric 450 MG Caps Take by mouth every morning.   UNABLE TO FIND Take by mouth every morning. Quercetin   & a  E 3 live       Allergies:  Allergies  Allergen Reactions  . Alendronate Swelling  . Ibandronic Acid Swelling    Past Medical History, Surgical  history, Social history, and Family History were reviewed and updated.  Review of Systems: Review of Systems  Constitutional: Negative.   HENT: Negative.   Eyes: Negative.   Respiratory: Negative.   Cardiovascular: Negative.   Gastrointestinal: Negative.   Genitourinary: Negative.   Musculoskeletal: Negative.   Skin: Negative.   Neurological: Negative.   Endo/Heme/Allergies: Negative.   Psychiatric/Behavioral: Negative.       Physical Exam:  weight is 146 lb (66.2 kg). Her temporal temperature is 97.1 F (36.2 C) (abnormal). Her blood pressure is 147/54 (abnormal) and her pulse is 73. Her respiration is 18 and oxygen saturation is 99%.   Wt Readings from Last 3 Encounters:  07/12/19 146 lb (66.2 kg)  01/11/19 164 lb (74.4 kg)  07/13/18 157 lb 8 oz (71.4 kg)    Physical Exam Vitals signs reviewed.  HENT:     Head: Normocephalic and atraumatic.  Eyes:     Pupils: Pupils are equal, round, and reactive to light.  Neck:     Musculoskeletal: Normal range of motion.  Cardiovascular:     Rate and Rhythm: Normal rate and regular rhythm.     Heart sounds: Normal heart sounds.  Pulmonary:     Effort: Pulmonary effort is normal.     Breath sounds: Normal breath sounds.  Abdominal:     General: Bowel sounds are normal.     Palpations: Abdomen is soft.  Musculoskeletal: Normal range of motion.        General: No tenderness or deformity.  Lymphadenopathy:     Cervical: No cervical adenopathy.  Skin:    General: Skin is warm and dry.     Findings: No erythema or rash.  Neurological:     Mental Status: She is alert and oriented to person, place, and time.  Psychiatric:        Behavior: Behavior normal.        Thought Content: Thought content normal.        Judgment: Judgment normal.     Lab Results  Component Value Date   WBC 25.9 (H) 07/12/2019   HGB 12.9 07/12/2019   HCT 38.2 07/12/2019   MCV 96.2 07/12/2019   PLT 154 07/12/2019   Lab Results  Component Value  Date   FERRITIN 30 01/11/2019   IRON 49 01/11/2019   TIBC 276 01/11/2019   UIBC 227 01/11/2019   IRONPCTSAT 18 (L) 01/11/2019   Lab Results  Component Value Date   RETICCTPCT 1.5 12/17/2010   RBC 3.97 07/12/2019   RETICCTABS 58.2 12/17/2010   No results found for: Nils Pyle, [facility] Lab Results  Component Value Date   IGGSERUM 600 (L) 05/02/2014   IGA 94 05/02/2014   IGMSERUM 26 (L) 05/02/2014   Lab Results  Component Value Date   TOTALPROTELP 6.1 12/17/2010   ALBUMINELP 66.1 12/17/2010   A1GS 3.6 12/17/2010   A2GS 8.8 12/17/2010   BETS 5.8 12/17/2010   BETA2SER 3.9 12/17/2010   GAMS 11.8 12/17/2010   MSPIKE NOT DET 12/17/2010   SPEI * 12/17/2010     Chemistry      Component Value Date/Time   NA 139 07/12/2019 0851   NA 139 11/11/2016 1426   NA 142 08/30/2015 0941   K 4.2 07/12/2019 0851   K 4.1 11/11/2016 1426   K 4.9 08/30/2015 0941   CL 104 07/12/2019 0851   CL 103 11/11/2016 1426   CO2 29 07/12/2019 0851   CO2 28 11/11/2016 1426   CO2 27 08/30/2015 0941   BUN 9 07/12/2019 0851   BUN 12 11/11/2016 1426   BUN 11.8 08/30/2015 0941   CREATININE 0.89 07/12/2019 0851   CREATININE 0.9 11/11/2016 1426   CREATININE 1.0 08/30/2015 0941      Component Value Date/Time   CALCIUM 9.0 07/12/2019 0851   CALCIUM 9.4 11/11/2016 1426   CALCIUM 9.5 08/30/2015 0941   ALKPHOS 47 07/12/2019 0851   ALKPHOS 65 11/11/2016 1426   ALKPHOS 62 08/30/2015 0941   AST 16 07/12/2019 0851   AST 19 08/30/2015 0941   ALT 11 07/12/2019 0851   ALT 20 11/11/2016 1426   ALT 13 08/30/2015 0941   BILITOT 1.0 07/12/2019 0851   BILITOT 1.25 (H) 08/30/2015 0941      Impression and Plan: Tamara Powell is a very pleasant 73 yo caucasian female with CLL as well as iron deficiency anemia.   Everything looks quite good with respect to her CLL.  I really do not see any problems with her CLL becoming active.  I think that we can now go to every 8 months.  I think this would  be reasonable to do.  Her blood counts have been holding steady  for the past couple years.  [name], MD 12/7/20209:49 AM

## 2019-08-11 DIAGNOSIS — Z6824 Body mass index (BMI) 24.0-24.9, adult: Secondary | ICD-10-CM | POA: Diagnosis not present

## 2019-08-11 DIAGNOSIS — B001 Herpesviral vesicular dermatitis: Secondary | ICD-10-CM | POA: Diagnosis not present

## 2019-08-27 DIAGNOSIS — Z1231 Encounter for screening mammogram for malignant neoplasm of breast: Secondary | ICD-10-CM | POA: Diagnosis not present

## 2019-09-17 DIAGNOSIS — E559 Vitamin D deficiency, unspecified: Secondary | ICD-10-CM | POA: Diagnosis not present

## 2019-09-17 DIAGNOSIS — Z1331 Encounter for screening for depression: Secondary | ICD-10-CM | POA: Diagnosis not present

## 2019-09-17 DIAGNOSIS — R5383 Other fatigue: Secondary | ICD-10-CM | POA: Diagnosis not present

## 2019-09-17 DIAGNOSIS — Z0000 Encounter for general adult medical examination without abnormal findings: Secondary | ICD-10-CM | POA: Diagnosis not present

## 2019-09-17 DIAGNOSIS — Z780 Asymptomatic menopausal state: Secondary | ICD-10-CM | POA: Diagnosis not present

## 2019-09-17 DIAGNOSIS — Z13220 Encounter for screening for lipoid disorders: Secondary | ICD-10-CM | POA: Diagnosis not present

## 2019-09-17 DIAGNOSIS — M8580 Other specified disorders of bone density and structure, unspecified site: Secondary | ICD-10-CM | POA: Diagnosis not present

## 2019-09-17 DIAGNOSIS — Z9181 History of falling: Secondary | ICD-10-CM | POA: Diagnosis not present

## 2019-09-17 DIAGNOSIS — M1990 Unspecified osteoarthritis, unspecified site: Secondary | ICD-10-CM | POA: Diagnosis not present

## 2019-09-17 DIAGNOSIS — D519 Vitamin B12 deficiency anemia, unspecified: Secondary | ICD-10-CM | POA: Diagnosis not present

## 2019-09-17 DIAGNOSIS — Z6821 Body mass index (BMI) 21.0-21.9, adult: Secondary | ICD-10-CM | POA: Diagnosis not present

## 2019-09-17 DIAGNOSIS — E785 Hyperlipidemia, unspecified: Secondary | ICD-10-CM | POA: Diagnosis not present

## 2019-10-21 DIAGNOSIS — H02833 Dermatochalasis of right eye, unspecified eyelid: Secondary | ICD-10-CM | POA: Diagnosis not present

## 2019-10-21 DIAGNOSIS — H02836 Dermatochalasis of left eye, unspecified eyelid: Secondary | ICD-10-CM | POA: Diagnosis not present

## 2019-10-21 DIAGNOSIS — H25813 Combined forms of age-related cataract, bilateral: Secondary | ICD-10-CM | POA: Diagnosis not present

## 2019-10-21 DIAGNOSIS — H527 Unspecified disorder of refraction: Secondary | ICD-10-CM | POA: Diagnosis not present

## 2019-11-03 DIAGNOSIS — H81399 Other peripheral vertigo, unspecified ear: Secondary | ICD-10-CM | POA: Diagnosis not present

## 2019-11-03 DIAGNOSIS — H8110 Benign paroxysmal vertigo, unspecified ear: Secondary | ICD-10-CM | POA: Diagnosis not present

## 2019-11-16 DIAGNOSIS — R1084 Generalized abdominal pain: Secondary | ICD-10-CM | POA: Diagnosis not present

## 2019-11-16 DIAGNOSIS — N3001 Acute cystitis with hematuria: Secondary | ICD-10-CM | POA: Diagnosis not present

## 2019-12-14 DIAGNOSIS — N951 Menopausal and female climacteric states: Secondary | ICD-10-CM | POA: Diagnosis not present

## 2019-12-14 DIAGNOSIS — Z6821 Body mass index (BMI) 21.0-21.9, adult: Secondary | ICD-10-CM | POA: Diagnosis not present

## 2019-12-14 DIAGNOSIS — M810 Age-related osteoporosis without current pathological fracture: Secondary | ICD-10-CM | POA: Diagnosis not present

## 2020-03-10 ENCOUNTER — Inpatient Hospital Stay (HOSPITAL_BASED_OUTPATIENT_CLINIC_OR_DEPARTMENT_OTHER): Payer: Medicare Other | Admitting: Hematology & Oncology

## 2020-03-10 ENCOUNTER — Encounter: Payer: Self-pay | Admitting: Hematology & Oncology

## 2020-03-10 ENCOUNTER — Inpatient Hospital Stay: Payer: Medicare Other | Attending: Hematology & Oncology

## 2020-03-10 ENCOUNTER — Other Ambulatory Visit: Payer: Self-pay

## 2020-03-10 ENCOUNTER — Telehealth: Payer: Self-pay | Admitting: Hematology & Oncology

## 2020-03-10 VITALS — BP 122/74 | HR 83 | Temp 98.8°F | Resp 16 | Ht 67.5 in | Wt 143.8 lb

## 2020-03-10 DIAGNOSIS — Z7982 Long term (current) use of aspirin: Secondary | ICD-10-CM | POA: Insufficient documentation

## 2020-03-10 DIAGNOSIS — D509 Iron deficiency anemia, unspecified: Secondary | ICD-10-CM | POA: Diagnosis not present

## 2020-03-10 DIAGNOSIS — C9110 Chronic lymphocytic leukemia of B-cell type not having achieved remission: Secondary | ICD-10-CM | POA: Insufficient documentation

## 2020-03-10 DIAGNOSIS — Z79899 Other long term (current) drug therapy: Secondary | ICD-10-CM | POA: Diagnosis not present

## 2020-03-10 LAB — CMP (CANCER CENTER ONLY)
ALT: 11 U/L (ref 0–44)
AST: 17 U/L (ref 15–41)
Albumin: 4.4 g/dL (ref 3.5–5.0)
Alkaline Phosphatase: 43 U/L (ref 38–126)
Anion gap: 6 (ref 5–15)
BUN: 11 mg/dL (ref 8–23)
CO2: 30 mmol/L (ref 22–32)
Calcium: 9.3 mg/dL (ref 8.9–10.3)
Chloride: 104 mmol/L (ref 98–111)
Creatinine: 0.9 mg/dL (ref 0.44–1.00)
GFR, Est AFR Am: >60 mL/min (ref >60)
GFR, Estimated: >60 mL/min (ref >60)
Glucose, Bld: 102 mg/dL — ABNORMAL HIGH (ref 70–99)
Potassium: 4.6 mmol/L (ref 3.5–5.1)
Sodium: 140 mmol/L (ref 135–145)
Total Bilirubin: 1.1 mg/dL (ref 0.3–1.2)
Total Protein: 6 g/dL — ABNORMAL LOW (ref 6.5–8.1)

## 2020-03-10 LAB — CBC WITH DIFFERENTIAL (CANCER CENTER ONLY)
Abs Immature Granulocytes: 0.02 10*3/uL (ref 0.00–0.07)
Basophils Absolute: 0 10*3/uL (ref 0.0–0.1)
Basophils Relative: 0 %
Eosinophils Absolute: 0.2 10*3/uL (ref 0.0–0.5)
Eosinophils Relative: 1 %
HCT: 36.9 % (ref 36.0–46.0)
Hemoglobin: 12.6 g/dL (ref 12.0–15.0)
Immature Granulocytes: 0 %
Lymphocytes Relative: 87 %
Lymphs Abs: 18.6 10*3/uL — ABNORMAL HIGH (ref 0.7–4.0)
MCH: 33.2 pg (ref 26.0–34.0)
MCHC: 34.1 g/dL (ref 30.0–36.0)
MCV: 97.4 fL (ref 80.0–100.0)
Monocytes Absolute: 0.5 10*3/uL (ref 0.1–1.0)
Monocytes Relative: 2 %
Neutro Abs: 2.2 10*3/uL (ref 1.7–7.7)
Neutrophils Relative %: 10 %
Platelet Count: 170 10*3/uL (ref 150–400)
RBC: 3.79 MIL/uL — ABNORMAL LOW (ref 3.87–5.11)
RDW: 13.2 % (ref 11.5–15.5)
WBC Count: 21.5 10*3/uL — ABNORMAL HIGH (ref 4.0–10.5)
nRBC: 0 % (ref 0.0–0.2)

## 2020-03-10 LAB — LACTATE DEHYDROGENASE: LDH: 138 U/L (ref 98–192)

## 2020-03-10 NOTE — Progress Notes (Signed)
Hematology and Oncology Follow Up Visit  GABRYELLE WHITMOYER [id number] Mar 07, 1946 74 y.o. 03/10/2020   Principle Diagnosis:  Stage A CLL Iron deficiency anemia   Current Therapy:   Observation IV iron as indicated    Interim History:  Ms. Cinco is here today for follow-up.  She is feeling pretty well.  She really has had no specific complaints.  She has busy helping with her grandchildren.  She really enjoys this.  She has had her Covid-19 vaccine.  She has had no fever.  She had no swollen lymph nodes.  There is been no change in bowel or bladder habits.  There has been no nausea or vomiting.  She has had no rashes.  There is been no leg swelling.  She and her family have been on vacation.  They went to the beach and had a good time.  Hopefully they will be able to go camping in the mountains in the fall.    Overall, her performance status is ECOG 1.   Medications:  Allergies as of 03/10/2020      Reactions   Alendronate Swelling   Ibandronic Acid Swelling      Medication List       Accurate as of March 10, 2020  9:26 AM. If you have any questions, ask your nurse or doctor.        aspirin 81 MG tablet Take 81 mg by mouth daily.   calcium carbonate 600 MG Tabs tablet Commonly known as: OS-CAL Take 600 mg by mouth 2 (two) times daily with a meal.   co-enzyme Q-10 30 MG capsule Take 100 mg by mouth 2 (two) times daily.   Evening Primrose Oil 500 MG Caps Take 500 mg by mouth 2 (two) times daily.   fish oil-omega-3 fatty acids 1000 MG capsule Take 1 g by mouth 2 (two) times daily.   Grapeseed Extract 500-50 MG Caps Take by mouth 2 (two) times daily.   GREEN TEA SLIM WITH EGCG PO Take by mouth.   multivitamin tablet Take 1 tablet by mouth daily.   OVER THE COUNTER MEDICATION Take by mouth 2 (two) times daily. WELLNESS FORMULA CAPSULE--HAIR SKIN NAILS CAPSULES.   Turmeric 450 MG Caps Take by mouth every morning.   UNABLE TO FIND Take by mouth every morning.  Quercetin   & a  E 3 live       Allergies:  Allergies  Allergen Reactions  . Alendronate Swelling  . Ibandronic Acid Swelling    Past Medical History, Surgical history, Social history, and Family History were reviewed and updated.  Review of Systems: Review of Systems  Constitutional: Negative.   HENT: Negative.   Eyes: Negative.   Respiratory: Negative.   Cardiovascular: Negative.   Gastrointestinal: Negative.   Genitourinary: Negative.   Musculoskeletal: Negative.   Skin: Negative.   Neurological: Negative.   Endo/Heme/Allergies: Negative.   Psychiatric/Behavioral: Negative.       Physical Exam:  height is 5' 7.5" (1.715 m) and weight is 143 lb 12.8 oz (65.2 kg). Her oral temperature is 98.8 F (37.1 C). Her blood pressure is 122/74 and her pulse is 83. Her respiration is 16 and oxygen saturation is 99%.   Wt Readings from Last 3 Encounters:  03/10/20 143 lb 12.8 oz (65.2 kg)  07/12/19 146 lb (66.2 kg)  01/11/19 164 lb (74.4 kg)    Physical Exam Vitals reviewed.  HENT:     Head: Normocephalic and atraumatic.  Eyes:  Pupils: Pupils are equal, round, and reactive to light.  Cardiovascular:     Rate and Rhythm: Normal rate and regular rhythm.     Heart sounds: Normal heart sounds.  Pulmonary:     Effort: Pulmonary effort is normal.     Breath sounds: Normal breath sounds.  Abdominal:     General: Bowel sounds are normal.     Palpations: Abdomen is soft.  Musculoskeletal:        General: No tenderness or deformity. Normal range of motion.     Cervical back: Normal range of motion.  Lymphadenopathy:     Cervical: No cervical adenopathy.  Skin:    General: Skin is warm and dry.     Findings: No erythema or rash.  Neurological:     Mental Status: She is alert and oriented to person, place, and time.  Psychiatric:        Behavior: Behavior normal.        Thought Content: Thought content normal.        Judgment: Judgment normal.     Lab Results    Component Value Date   WBC 21.5 (H) 03/10/2020   HGB 12.6 03/10/2020   HCT 36.9 03/10/2020   MCV 97.4 03/10/2020   PLT 170 03/10/2020   Lab Results  Component Value Date   FERRITIN 30 01/11/2019   IRON 49 01/11/2019   TIBC 276 01/11/2019   UIBC 227 01/11/2019   IRONPCTSAT 18 (L) 01/11/2019   Lab Results  Component Value Date   RETICCTPCT 1.5 12/17/2010   RBC 3.79 (L) 03/10/2020   RETICCTABS 58.2 12/17/2010   No results found for: Nils Pyle, [facility] Lab Results  Component Value Date   IGGSERUM 600 (L) 05/02/2014   IGA 94 05/02/2014   IGMSERUM 26 (L) 05/02/2014   Lab Results  Component Value Date   TOTALPROTELP 6.1 12/17/2010   ALBUMINELP 66.1 12/17/2010   A1GS 3.6 12/17/2010   A2GS 8.8 12/17/2010   BETS 5.8 12/17/2010   BETA2SER 3.9 12/17/2010   GAMS 11.8 12/17/2010   MSPIKE NOT DET 12/17/2010   SPEI * 12/17/2010     Chemistry      Component Value Date/Time   NA 139 07/12/2019 0851   NA 139 11/11/2016 1426   NA 142 08/30/2015 0941   K 4.2 07/12/2019 0851   K 4.1 11/11/2016 1426   K 4.9 08/30/2015 0941   CL 104 07/12/2019 0851   CL 103 11/11/2016 1426   CO2 29 07/12/2019 0851   CO2 28 11/11/2016 1426   CO2 27 08/30/2015 0941   BUN 9 07/12/2019 0851   BUN 12 11/11/2016 1426   BUN 11.8 08/30/2015 0941   CREATININE 0.89 07/12/2019 0851   CREATININE 0.9 11/11/2016 1426   CREATININE 1.0 08/30/2015 0941      Component Value Date/Time   CALCIUM 9.0 07/12/2019 0851   CALCIUM 9.4 11/11/2016 1426   CALCIUM 9.5 08/30/2015 0941   ALKPHOS 47 07/12/2019 0851   ALKPHOS 65 11/11/2016 1426   ALKPHOS 62 08/30/2015 0941   AST 16 07/12/2019 0851   AST 19 08/30/2015 0941   ALT 11 07/12/2019 0851   ALT 20 11/11/2016 1426   ALT 13 08/30/2015 0941   BILITOT 1.0 07/12/2019 0851   BILITOT 1.25 (H) 08/30/2015 0941      Impression and Plan: Ms. Heringer is a very pleasant 74 yo caucasian female with CLL as well as iron deficiency anemia.    Everything looks quite good with respect  to her CLL.  I really do not see any problems with her CLL becoming active.  I think that we can now go to yearly visits.  She can always come in earlier if she has any problems.  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  Again, if she does need to have anything done surgical wise, I do not see an issue.     [name], MD 8/6/20219:26 AM

## 2020-03-10 NOTE — Telephone Encounter (Signed)
Appointments scheduled calendar printed per 8/6 los 

## 2020-06-02 DIAGNOSIS — R1084 Generalized abdominal pain: Secondary | ICD-10-CM | POA: Diagnosis not present

## 2020-06-02 DIAGNOSIS — M1990 Unspecified osteoarthritis, unspecified site: Secondary | ICD-10-CM | POA: Diagnosis not present

## 2020-06-02 DIAGNOSIS — Z6821 Body mass index (BMI) 21.0-21.9, adult: Secondary | ICD-10-CM | POA: Diagnosis not present

## 2020-07-05 DIAGNOSIS — M545 Low back pain, unspecified: Secondary | ICD-10-CM | POA: Diagnosis not present

## 2020-07-05 DIAGNOSIS — M47816 Spondylosis without myelopathy or radiculopathy, lumbar region: Secondary | ICD-10-CM | POA: Diagnosis not present

## 2020-07-09 DIAGNOSIS — K5790 Diverticulosis of intestine, part unspecified, without perforation or abscess without bleeding: Secondary | ICD-10-CM | POA: Diagnosis not present

## 2020-07-09 DIAGNOSIS — D649 Anemia, unspecified: Secondary | ICD-10-CM | POA: Diagnosis present

## 2020-07-09 DIAGNOSIS — R160 Hepatomegaly, not elsewhere classified: Secondary | ICD-10-CM | POA: Diagnosis present

## 2020-07-09 DIAGNOSIS — Z9889 Other specified postprocedural states: Secondary | ICD-10-CM | POA: Diagnosis not present

## 2020-07-09 DIAGNOSIS — K5792 Diverticulitis of intestine, part unspecified, without perforation or abscess without bleeding: Secondary | ICD-10-CM | POA: Diagnosis not present

## 2020-07-09 DIAGNOSIS — C259 Malignant neoplasm of pancreas, unspecified: Secondary | ICD-10-CM | POA: Diagnosis not present

## 2020-07-09 DIAGNOSIS — Z79899 Other long term (current) drug therapy: Secondary | ICD-10-CM | POA: Diagnosis not present

## 2020-07-09 DIAGNOSIS — G8929 Other chronic pain: Secondary | ICD-10-CM | POA: Diagnosis present

## 2020-07-09 DIAGNOSIS — K5730 Diverticulosis of large intestine without perforation or abscess without bleeding: Secondary | ICD-10-CM | POA: Diagnosis not present

## 2020-07-09 DIAGNOSIS — K6389 Other specified diseases of intestine: Secondary | ICD-10-CM | POA: Diagnosis not present

## 2020-07-09 DIAGNOSIS — C229 Malignant neoplasm of liver, not specified as primary or secondary: Secondary | ICD-10-CM | POA: Diagnosis not present

## 2020-07-09 DIAGNOSIS — R102 Pelvic and perineal pain: Secondary | ICD-10-CM | POA: Diagnosis not present

## 2020-07-09 DIAGNOSIS — C9110 Chronic lymphocytic leukemia of B-cell type not having achieved remission: Secondary | ICD-10-CM | POA: Diagnosis not present

## 2020-07-09 DIAGNOSIS — C187 Malignant neoplasm of sigmoid colon: Secondary | ICD-10-CM | POA: Diagnosis present

## 2020-07-09 DIAGNOSIS — R6889 Other general symptoms and signs: Secondary | ICD-10-CM | POA: Diagnosis present

## 2020-07-09 DIAGNOSIS — R935 Abnormal findings on diagnostic imaging of other abdominal regions, including retroperitoneum: Secondary | ICD-10-CM | POA: Diagnosis not present

## 2020-07-09 DIAGNOSIS — K7689 Other specified diseases of liver: Secondary | ICD-10-CM | POA: Diagnosis not present

## 2020-07-09 DIAGNOSIS — C787 Secondary malignant neoplasm of liver and intrahepatic bile duct: Secondary | ICD-10-CM | POA: Diagnosis present

## 2020-07-18 ENCOUNTER — Encounter: Payer: Self-pay | Admitting: *Deleted

## 2020-07-18 NOTE — Progress Notes (Signed)
Patient is an existing patient who is being followed for CLL.   Oncology Nurse Navigator Documentation  Oncology Nurse Navigator Flowsheets 07/18/2020  Navigator Follow Up Date: 07/19/2020  Navigator Follow Up Reason: Follow-up After Biopsy  Navigator Location [facility]  Navigator Encounter Type Telephone  Telephone Outgoing Call;Appt Confirmation/Clarification  Patient Visit Type MedOnc  Treatment Phase Pre-Tx/Tx Discussion  Barriers/Navigation Needs Coordination of Care;Education  Education Other  Interventions Coordination of Care;Education  Acuity Level 2-Minimal Needs (1-2 Barriers Identified)  Coordination of Care Appts  Education Method Verbal  Support Groups/Services Friends and Family  Time Spent with Patient 30

## 2020-07-19 ENCOUNTER — Other Ambulatory Visit: Payer: Self-pay

## 2020-07-19 ENCOUNTER — Inpatient Hospital Stay (HOSPITAL_BASED_OUTPATIENT_CLINIC_OR_DEPARTMENT_OTHER): Payer: Medicare Other | Admitting: Hematology & Oncology

## 2020-07-19 ENCOUNTER — Inpatient Hospital Stay: Payer: Medicare Other | Attending: Hematology & Oncology

## 2020-07-19 ENCOUNTER — Encounter: Payer: Self-pay | Admitting: Hematology & Oncology

## 2020-07-19 ENCOUNTER — Encounter: Payer: Self-pay | Admitting: *Deleted

## 2020-07-19 VITALS — BP 113/69 | HR 88 | Temp 97.9°F | Resp 20 | Wt 143.1 lb

## 2020-07-19 DIAGNOSIS — R918 Other nonspecific abnormal finding of lung field: Secondary | ICD-10-CM | POA: Insufficient documentation

## 2020-07-19 DIAGNOSIS — R978 Other abnormal tumor markers: Secondary | ICD-10-CM | POA: Insufficient documentation

## 2020-07-19 DIAGNOSIS — Z85820 Personal history of malignant melanoma of skin: Secondary | ICD-10-CM | POA: Diagnosis not present

## 2020-07-19 DIAGNOSIS — Z79899 Other long term (current) drug therapy: Secondary | ICD-10-CM

## 2020-07-19 DIAGNOSIS — C787 Secondary malignant neoplasm of liver and intrahepatic bile duct: Secondary | ICD-10-CM | POA: Diagnosis not present

## 2020-07-19 DIAGNOSIS — C799 Secondary malignant neoplasm of unspecified site: Secondary | ICD-10-CM

## 2020-07-19 DIAGNOSIS — C801 Malignant (primary) neoplasm, unspecified: Secondary | ICD-10-CM | POA: Insufficient documentation

## 2020-07-19 DIAGNOSIS — C9110 Chronic lymphocytic leukemia of B-cell type not having achieved remission: Secondary | ICD-10-CM | POA: Diagnosis not present

## 2020-07-19 DIAGNOSIS — R970 Elevated carcinoembryonic antigen [CEA]: Secondary | ICD-10-CM | POA: Diagnosis not present

## 2020-07-19 DIAGNOSIS — Z7189 Other specified counseling: Secondary | ICD-10-CM

## 2020-07-19 HISTORY — DX: Malignant (primary) neoplasm, unspecified: C80.1

## 2020-07-19 HISTORY — DX: Secondary malignant neoplasm of liver and intrahepatic bile duct: C78.7

## 2020-07-19 HISTORY — DX: Other specified counseling: Z71.89

## 2020-07-19 LAB — CBC WITH DIFFERENTIAL (CANCER CENTER ONLY)
Abs Immature Granulocytes: 0.04 10*3/uL (ref 0.00–0.07)
Basophils Absolute: 0 10*3/uL (ref 0.0–0.1)
Basophils Relative: 0 %
Eosinophils Absolute: 0.2 10*3/uL (ref 0.0–0.5)
Eosinophils Relative: 1 %
HCT: 33.7 % — ABNORMAL LOW (ref 36.0–46.0)
Hemoglobin: 11 g/dL — ABNORMAL LOW (ref 12.0–15.0)
Immature Granulocytes: 0 %
Lymphocytes Relative: 83 %
Lymphs Abs: 17 10*3/uL — ABNORMAL HIGH (ref 0.7–4.0)
MCH: 31.8 pg (ref 26.0–34.0)
MCHC: 32.6 g/dL (ref 30.0–36.0)
MCV: 97.4 fL (ref 80.0–100.0)
Monocytes Absolute: 0.3 10*3/uL (ref 0.1–1.0)
Monocytes Relative: 1 %
Neutro Abs: 3.1 10*3/uL (ref 1.7–7.7)
Neutrophils Relative %: 15 %
Platelet Count: 215 10*3/uL (ref 150–400)
RBC: 3.46 MIL/uL — ABNORMAL LOW (ref 3.87–5.11)
RDW: 13.9 % (ref 11.5–15.5)
WBC Count: 20.6 10*3/uL — ABNORMAL HIGH (ref 4.0–10.5)
nRBC: 0 % (ref 0.0–0.2)

## 2020-07-19 LAB — CMP (CANCER CENTER ONLY)
ALT: 15 U/L (ref 0–44)
AST: 16 U/L (ref 15–41)
Albumin: 4.3 g/dL (ref 3.5–5.0)
Alkaline Phosphatase: 77 U/L (ref 38–126)
Anion gap: 7 (ref 5–15)
BUN: 12 mg/dL (ref 8–23)
CO2: 29 mmol/L (ref 22–32)
Calcium: 9.1 mg/dL (ref 8.9–10.3)
Chloride: 101 mmol/L (ref 98–111)
Creatinine: 0.91 mg/dL (ref 0.44–1.00)
GFR, Estimated: >60 mL/min (ref >60)
Glucose, Bld: 111 mg/dL — ABNORMAL HIGH (ref 70–99)
Potassium: 4 mmol/L (ref 3.5–5.1)
Sodium: 137 mmol/L (ref 135–145)
Total Bilirubin: 0.4 mg/dL (ref 0.3–1.2)
Total Protein: 6.2 g/dL — ABNORMAL LOW (ref 6.5–8.1)

## 2020-07-19 NOTE — Progress Notes (Signed)
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 Follow-up  : I have cancer my liver.  HPI: [name] is well-known to me.  She is very charming 74 year old white female.  I have seen her for several years.  She has history of CLL.  She has never been treated for CLL.  She has been told asymptomatic with CLL.  She had apparently had a bout of diverticulitis recently.  She [place] in the [place] area.  She ultimately went to the hospital.  The ER docs did a great job and ordered a CT scan of her abdomen pelvis.  Shockingly, this showed multiple liver lesions.  She had a three-point centimeter lesion of the dome of the right liver.  There was a 2.4 cm lesion in the left lobe of the liver.  She has other additional lesions.  Everything else looked fine.  The pancreas looked okay.  There is no stomach wall thickening.  She was then seen by Dr. Lyda Jester of Gastroenterology.  He did a fantastic job and did a both upper endoscopy and lower endoscopy.  Unfortunately, nothing showed up on either procedure as to where a primary lesion could have been.  She then underwent a liver biopsy.  She tolerated this well.  I received a call from the pathologist a couple days ago.  She says that that this is a adenocarcinoma but she does not know what the primary site is.  Apparently, the tumor got sent out for a second opinion.  On her CT scan, there is a mention made of a new 5 mm nodule in the left lung.  She has had 20 pound weight loss.  She says that she was trying to lose weight.  She is had no problems with her breast.  She has had no breast swelling.  She has had no lymphadenopathy.  She has had no skin lesions.  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  There was no obvious nausea or vomiting.  She has had no rashes.  She does not smoke.  She really does not have much in the  way of alcoholic beverages.  She is feeling well for Thanksgiving.  She has had some chronic back issues.  Overall, I would say her performance status is ECOG 1.    Past Medical History:  Diagnosis Date  . Skin cancer    melanoma found on abdomen and removed by Dr. Pablo Ledger; followed by dermatology every 6 months  :  History reviewed. No pertinent surgical history.:   Current Outpatient Medications:  .  calcium carbonate (OS-CAL) 600 MG TABS, Take 600 mg by mouth 2 (two) times daily with a meal., Disp: , Rfl:  .  Cholecalciferol 25 MCG (1000 UT) tablet, 1,000 Units daily., Disp: , Rfl:  .  co-enzyme Q-10 30 MG capsule, Take 100 mg by mouth 2 (two) times daily., Disp: , Rfl:  .  COLLAGEN PO, Take by mouth. One scoop of powder daily in coffee., Disp: , Rfl:  .  fish oil-omega-3 fatty acids 1000 MG capsule, Take 1 g by mouth 2 (two) times daily., Disp: , Rfl:  .  Green Tea, Camellia sinensis, (GREEN TEA EXTRACT PO), Take by mouth. Takes 3 caps daily., Disp: , Rfl:  .  Multiple Vitamin (MULTIVITAMIN) tablet, Take 1 tablet by mouth daily., Disp: , Rfl:  .  Nutritional Supplements (GRAPESEED  EXTRACT) 500-50 MG CAPS, Take by mouth 2 (two) times daily., Disp: , Rfl:  .  OVER THE COUNTER MEDICATION, Take by mouth 2 (two) times daily. WELLNESS FORMULA CAPSULE--HAIR SKIN NAILS CAPSULES., Disp: , Rfl:  .  OVER THE COUNTER MEDICATION, Green Vibrance daily., Disp: , Rfl:  .  Turmeric 450 MG CAPS, Take by mouth every morning., Disp: , Rfl:  .  vitamin E 180 MG (400 UNITS) capsule, 400 Units daily., Disp: , Rfl: :  :  Allergies  Allergen Reactions  . Alendronate Swelling  . Ibandronic Acid Swelling  :  History reviewed. No pertinent family history.:  Social History   Socioeconomic History  . Marital status: Married    Spouse name: Not on file  . Number of children: Not on file  . Years of education: Not on file  . Highest education level: Not on file  Occupational History  . Not on  file  Tobacco Use  . Smoking status: Never Smoker  . Smokeless tobacco: Never Used  . Tobacco comment: never used tobacco  Vaping Use  . Vaping Use: Never used  Substance and Sexual Activity  . Alcohol use: Not Currently    Alcohol/week: 0.0 standard drinks  . Drug use: Not Currently  . Sexual activity: Not Currently  Other Topics Concern  . Not on file  Social History Narrative  . Not on file   Social Determinants of Health   Financial Resource Strain: Not on file  Food Insecurity: Not on file  Transportation Needs: Not on file  Physical Activity: Not on file  Stress: Not on file  Social Connections: Not on file  Intimate Partner Violence: Not on file  : Review of Systems  Constitutional: Positive for weight loss.  HENT: Negative.   Eyes: Negative.   Respiratory: Negative.   Cardiovascular: Negative.   Gastrointestinal: Negative.   Genitourinary: Negative.   Musculoskeletal: Negative.   Skin: Negative.   Neurological: Negative.   Endo/Heme/Allergies: Negative.   Psychiatric/Behavioral: Negative.      Exam:  This is a well-developed and well-nourished white female in no obvious distress.  Vital signs show temperature 97.9.  Pulse 88.  Blood pressure 113/69.  Weight is 143 pounds.  Head and neck exam shows no ocular or oral lesions.  She has no palpable adenopathy in the neck or supraclavicular region.  There is no palpable thyroid.  Lungs are clear bilaterally.  Cardiac exam regular rate and rhythm with no murmurs, rubs or bruits.  Axillary exam shows no bilateral axillary adenopathy.  Abdominal exam shows a soft abdomen with good bowel sounds.  There is no fluid wave.  There is no guarding or rebound tenderness.  She has a well-healed wide local excision scar in the left lower abdomen.  There is no palpable liver or spleen tip.  Back exam shows no tenderness over the spine, ribs or hips.  Extremities shows no clubbing, cyanosis or edema.  Neurological exam shows no focal  neurological deficits.  Skin exam shows no rashes, ecchymoses or petechia.  @IPVITALS @   Recent Labs    07/19/20 1446  WBC 20.6*  HGB 11.0*  HCT 33.7*  PLT 215   Recent Labs    07/19/20 1446  NA 137  K 4.0  CL 101  CO2 29  GLUCOSE 111*  BUN 12  CREATININE 0.91  CALCIUM 9.1    Blood smear review: None  Pathology: Pending    Assessment and Plan: [name] is a very nice 74 year old  white female.  She has a history of CLL.  This has not been a problem for her.  Now, we clearly have a real problem.  The problem that we have is that she has metastasis to the liver.  We do not know with the primary site is.  I had a long talk with she and her husband.  I told him that this is already stage IV disease.  I told him that with stage IV malignancy, we can treat this but we cannot cure this.  I told him that the real problem is that we do not know the primary site of this.  We really need to know the primary site in order for [place] to come up with the best recommendations for therapy.  I will try to get a CT scan of her chest.  I think this certainly might be helpful to see if there is any kind of primary in the chest even though she is a nonsmoker.  If she is a nonsmoker, then possibly there may be a genetic mutation that we might be able to utilize and treat.  I just have a feeling that we are dealing with an occult pancreatic tumor.  We will get a PET scan and see if this might help [place] out.  Again, we will try to see what the primary site is.  Maybe, after a second opinion is done, then we will have more of an idea as to the primary site.  We will plan to have her come back once we get all the results back from her scans and pathology.  We will then come up with our recommendations for treatment.

## 2020-07-19 NOTE — Progress Notes (Signed)
Patient is an established patient with CLL who had a recent admission and was found to have a liver lesion. This lesion pathology shows adenocarcinoma. At this time, we don't know the primary.   Visited with patient prior to her appointment with Dr Marin Olp. Introduced myself and the role of the navigator. Gave patient and her husband my card. Explained that after her visit with Dr Marin Olp I will begin working to get orders scheduled.   Will follow up on orders tomorrow, once placed. Patient knows I will call once plan has been scheduled.   Oncology Nurse Navigator Documentation  Oncology Nurse Navigator Flowsheets 07/19/2020  Navigator Follow Up Date: 07/20/2020  Navigator Follow Up Reason: Appointment Review  Navigator Location [facility]  Navigator Encounter Type Follow-up Appt  Telephone -  Patient Visit Type MedOnc  Treatment Phase Abnormal Labs  Barriers/Navigation Needs Coordination of Care;Education  Education Other  Interventions Coordination of Care;Education  Acuity Level 2-Minimal Needs (1-2 Barriers Identified)  Coordination of Care -  Education Method Verbal  Support Groups/Services Friends and Family  Time Spent with Patient 30

## 2020-07-20 ENCOUNTER — Telehealth: Payer: Self-pay

## 2020-07-20 ENCOUNTER — Telehealth: Payer: Self-pay | Admitting: Hematology & Oncology

## 2020-07-20 ENCOUNTER — Encounter: Payer: Self-pay | Admitting: *Deleted

## 2020-07-20 ENCOUNTER — Ambulatory Visit (HOSPITAL_BASED_OUTPATIENT_CLINIC_OR_DEPARTMENT_OTHER)
Admission: RE | Admit: 2020-07-20 | Discharge: 2020-07-20 | Disposition: A | Discharge status: Home / Self Care | Payer: Medicare Other | Source: Ambulatory Visit | Attending: Hematology & Oncology | Admitting: Hematology & Oncology

## 2020-07-20 DIAGNOSIS — I2510 Atherosclerotic heart disease of native coronary artery without angina pectoris: Secondary | ICD-10-CM | POA: Diagnosis not present

## 2020-07-20 DIAGNOSIS — J984 Other disorders of lung: Secondary | ICD-10-CM | POA: Diagnosis not present

## 2020-07-20 DIAGNOSIS — J438 Other emphysema: Secondary | ICD-10-CM | POA: Diagnosis not present

## 2020-07-20 DIAGNOSIS — C801 Malignant (primary) neoplasm, unspecified: Secondary | ICD-10-CM | POA: Diagnosis not present

## 2020-07-20 DIAGNOSIS — I77810 Thoracic aortic ectasia: Secondary | ICD-10-CM | POA: Diagnosis not present

## 2020-07-20 LAB — CANCER ANTIGEN 19-9: CA 19-9: 8033 U/mL — ABNORMAL HIGH (ref 0–35)

## 2020-07-20 LAB — CEA (IN HOUSE-CHCC): CEA (CHCC-In House): 160.41 ng/mL — ABNORMAL HIGH (ref 0.00–5.00)

## 2020-07-20 LAB — LACTATE DEHYDROGENASE: LDH: 121 U/L (ref 98–192)

## 2020-07-20 NOTE — Telephone Encounter (Signed)
No 07/19/20 los.... AOM

## 2020-07-20 NOTE — Progress Notes (Signed)
Patient needs CT scan and PET for staging. Both scheduled. Patient is aware of both the CT and PET scan appointments. Will also mail patient the PET preporation info for education reinforcement.   Oncology Nurse Navigator Documentation  Oncology Nurse Navigator Flowsheets 07/20/2020  Navigator Follow Up Date: 07/21/2020  Navigator Follow Up Reason: Scan Review  Navigator Location [facility]  Navigator Encounter Type Appt/Treatment Plan Review  Telephone -  Patient Visit Type MedOnc  Treatment Phase Abnormal Labs  Barriers/Navigation Needs Coordination of Care;Education  Education Other  Interventions Coordination of Care;Education  Acuity Level 2-Minimal Needs (1-2 Barriers Identified)  Coordination of Care Radiology  Education Method Written  Support Groups/Services Friends and Family  Time Spent with Patient 48

## 2020-07-20 NOTE — Telephone Encounter (Signed)
No los 12/15

## 2020-07-21 ENCOUNTER — Encounter: Payer: Self-pay | Admitting: *Deleted

## 2020-07-21 NOTE — Progress Notes (Signed)
Oncology Nurse Navigator Documentation  Oncology Nurse Navigator Flowsheets 07/21/2020  Navigator Follow Up Date: 07/24/2020  Navigator Follow Up Reason: Appointment Review  Navigator Location [facility]  Navigator Encounter Type Scan Review  Telephone -  Patient Visit Type MedOnc  Treatment Phase Abnormal Labs  Barriers/Navigation Needs Coordination of Care;Education  Education -  Interventions None Required  Acuity Level 2-Minimal Needs (1-2 Barriers Identified)  Coordination of Care -  Education Method -  Support Groups/Services Friends and Family  Time Spent with Patient 15

## 2020-07-25 ENCOUNTER — Inpatient Hospital Stay (HOSPITAL_BASED_OUTPATIENT_CLINIC_OR_DEPARTMENT_OTHER): Payer: Medicare Other | Admitting: Hematology & Oncology

## 2020-07-25 ENCOUNTER — Encounter: Payer: Self-pay | Admitting: Hematology & Oncology

## 2020-07-25 ENCOUNTER — Encounter: Payer: Self-pay | Admitting: *Deleted

## 2020-07-25 ENCOUNTER — Other Ambulatory Visit: Payer: Self-pay

## 2020-07-25 VITALS — BP 133/72 | HR 87 | Temp 97.6°F | Resp 18 | Wt 145.0 lb

## 2020-07-25 DIAGNOSIS — C787 Secondary malignant neoplasm of liver and intrahepatic bile duct: Secondary | ICD-10-CM | POA: Diagnosis not present

## 2020-07-25 DIAGNOSIS — C801 Malignant (primary) neoplasm, unspecified: Secondary | ICD-10-CM

## 2020-07-25 DIAGNOSIS — C9110 Chronic lymphocytic leukemia of B-cell type not having achieved remission: Secondary | ICD-10-CM | POA: Diagnosis not present

## 2020-07-25 DIAGNOSIS — C259 Malignant neoplasm of pancreas, unspecified: Secondary | ICD-10-CM | POA: Diagnosis not present

## 2020-07-25 DIAGNOSIS — C7800 Secondary malignant neoplasm of unspecified lung: Secondary | ICD-10-CM | POA: Diagnosis not present

## 2020-07-25 DIAGNOSIS — R918 Other nonspecific abnormal finding of lung field: Secondary | ICD-10-CM | POA: Diagnosis not present

## 2020-07-25 DIAGNOSIS — R970 Elevated carcinoembryonic antigen [CEA]: Secondary | ICD-10-CM | POA: Diagnosis not present

## 2020-07-25 DIAGNOSIS — R978 Other abnormal tumor markers: Secondary | ICD-10-CM | POA: Diagnosis not present

## 2020-07-25 HISTORY — DX: Malignant neoplasm of pancreas, unspecified: C25.9

## 2020-07-25 HISTORY — DX: Secondary malignant neoplasm of liver and intrahepatic bile duct: C78.7

## 2020-07-25 NOTE — Progress Notes (Signed)
START ON PATHWAY REGIMEN - Pancreatic Adenocarcinoma     A cycle is every 14 days:     Oxaliplatin      Leucovorin      Irinotecan      Fluorouracil   **Always confirm dose/schedule in your pharmacy ordering system**  Patient Characteristics: Metastatic Disease, First Line, PS = 0,1, BRCA1/2 and PALB2  Mutation Absent/Unknown Therapeutic Status: Metastatic Disease Line of Therapy: First Line ECOG Performance Status: 1 BRCA1/2 Mutation Status: Awaiting Test Results PALB2 Mutation Status: Awaiting Test Results Intent of Therapy: Non-Curative / Palliative Intent, Discussed with Patient 

## 2020-07-25 NOTE — Progress Notes (Signed)
Hematology and Oncology Follow Up Visit  Tamara Powell [id number] 1946-05-27 74 y.o. 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 I saw her last week. Unfortunate, I think we do have metastatic pancreatic cancer. Her CA 19-9 came back at 8000. Her CEA came back at 180. I really believe that given her weight loss and the clinical presentation of this malignancy, that this is pancreatic carcinoma.  We did do a CT of the chest on her. The CT scan of the chest did show small subcentimeter pulmonary nodules which the radiologist felt were "suspicious for metastatic disease."  She still has a good performance status. Performance status is ECOG 1. As such, I think that we could go ahead and treat her aggressively.  I did talk to her about treatment. I said that our goal for treatment clearly is quality of life. I want to try to shrink the tumor. I told her that we cannot cure this as this is stage IV. She and her husband both understand this.  We are awaiting the molecular markers. I would think that the chance of finding a actionable mutation would be quite low. As such, I think we should proceed with systemic chemotherapy.  She is due for a PET scan next week. This may help [place] out with identifying a primary in the pancreas.  She has not had any problems with nausea or vomiting. She is eating okay. She does not use much.  There is no issues with bowels or bladder.. She has had problems with diverticulitis. Hopefully, this will not be an issue with respect to chemotherapy.  Currently, she has had no problems with cough. There is no shortness of breath. There is no chest wall pain. She has had no rashes.    Medications:  Current Outpatient Medications:  .  calcium carbonate  (OS-CAL) 600 MG TABS, Take 600 mg by mouth 2 (two) times daily with a meal., Disp: , Rfl:  .  Cholecalciferol 25 MCG (1000 UT) tablet, 1,000 Units daily., Disp: , Rfl:  .  co-enzyme Q-10 30 MG capsule, Take 100 mg by mouth 2 (two) times daily., Disp: , Rfl:  .  COLLAGEN PO, Take by mouth. One scoop of powder daily in coffee., Disp: , Rfl:  .  fish oil-omega-3 fatty acids 1000 MG capsule, Take 1 g by mouth 2 (two) times daily., Disp: , Rfl:  .  Green Tea, Camellia sinensis, (GREEN TEA EXTRACT PO), Take by mouth. Takes 3 caps daily., Disp: , Rfl:  .  Multiple Vitamin (MULTIVITAMIN) tablet, Take 1 tablet by mouth daily., Disp: , Rfl:  .  Nutritional Supplements (GRAPESEED EXTRACT) 500-50 MG CAPS, Take by mouth 2 (two) times daily., Disp: , Rfl:  .  OVER THE COUNTER MEDICATION, Take by mouth 2 (two) times daily. WELLNESS FORMULA CAPSULE--HAIR SKIN NAILS CAPSULES., Disp: , Rfl:  .  OVER THE COUNTER MEDICATION, Green Vibrance daily., Disp: , Rfl:  .  Turmeric 450 MG CAPS, Take by mouth every morning., Disp: , Rfl:  .  vitamin E 180 MG (400 UNITS) capsule, 400 Units daily., Disp: , Rfl:   Allergies:  Allergies  Allergen Reactions  . Alendronate Swelling  . Ibandronic Acid Swelling  . Latex Hives    Past Medical History, Surgical history,  Social history, and Family History were reviewed and updated.  Review of Systems: Review of Systems  Constitutional: Positive for unexpected weight change.  HENT:  Negative.   Eyes: Negative.   Respiratory: Negative.   Gastrointestinal: Positive for abdominal pain and diarrhea. Negative for abdominal distention.  Endocrine: Negative.   Genitourinary: Negative.    Musculoskeletal: Negative.   Skin: Negative.   Neurological: Negative.   Hematological: Negative.   Psychiatric/Behavioral: Negative.     Physical Exam:  weight is 145 lb (65.8 kg). Her oral temperature is 97.6 F (36.4 C). Her blood pressure is 133/72 and her pulse is 87. Her respiration  is 18 and oxygen saturation is 99%.   Wt Readings from Last 3 Encounters:  07/25/20 145 lb (65.8 kg)  07/19/20 143 lb 1.9 oz (64.9 kg)  03/10/20 143 lb 12.8 oz (65.2 kg)    Physical Exam Vitals reviewed.  HENT:     Head: Normocephalic and atraumatic.     Mouth/Throat:     Mouth: Oropharynx is clear and moist.  Eyes:     Extraocular Movements: EOM normal.     Pupils: Pupils are equal, round, and reactive to light.  Cardiovascular:     Rate and Rhythm: Normal rate and regular rhythm.     Heart sounds: Normal heart sounds.  Pulmonary:     Effort: Pulmonary effort is normal.     Breath sounds: Normal breath sounds.  Abdominal:     General: Bowel sounds are normal.     Palpations: Abdomen is soft.  Musculoskeletal:        General: No tenderness, deformity or edema. Normal range of motion.     Cervical back: Normal range of motion.  Lymphadenopathy:     Cervical: No cervical adenopathy.  Skin:    General: Skin is warm and dry.     Findings: No erythema or rash.  Neurological:     Mental Status: She is alert and oriented to person, place, and time.  Psychiatric:        Mood and Affect: Mood and affect normal.        Behavior: Behavior normal.        Thought Content: Thought content normal.        Judgment: Judgment normal.      Lab Results  Component Value Date   WBC 20.6 (H) 07/19/2020   HGB 11.0 (L) 07/19/2020   HCT 33.7 (L) 07/19/2020   MCV 97.4 07/19/2020   PLT 215 07/19/2020     Chemistry      Component Value Date/Time   NA 137 07/19/2020 1446   NA 139 11/11/2016 1426   NA 142 08/30/2015 0941   K 4.0 07/19/2020 1446   K 4.1 11/11/2016 1426   K 4.9 08/30/2015 0941   CL 101 07/19/2020 1446   CL 103 11/11/2016 1426   CO2 29 07/19/2020 1446   CO2 28 11/11/2016 1426   CO2 27 08/30/2015 0941   BUN 12 07/19/2020 1446   BUN 12 11/11/2016 1426   BUN 11.8 08/30/2015 0941   CREATININE 0.91 07/19/2020 1446   CREATININE 0.9 11/11/2016 1426   CREATININE 1.0  08/30/2015 0941      Component Value Date/Time   CALCIUM 9.1 07/19/2020 1446   CALCIUM 9.4 11/11/2016 1426   CALCIUM 9.5 08/30/2015 0941   ALKPHOS 77 07/19/2020 1446   ALKPHOS 65 11/11/2016 1426   ALKPHOS 62 08/30/2015 0941   AST 16 07/19/2020 1446   AST 19 08/30/2015 0941  ALT 15 07/19/2020 1446   ALT 20 11/11/2016 1426   ALT 13 08/30/2015 0941   BILITOT 0.4 07/19/2020 1446   BILITOT 1.25 (H) 08/30/2015 0941      Impression and Plan: Ms. Wigington is a very charming 74 year old white female that I have known for several years. 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 Unfortunate, she now has issues with respect to metastatic pancreatic cancer. This is definitely a real problem. She has lost some weight. I just hate the fact that she now is dealing with this.  I talked to her husband for about an hour. Again explained to them that we are dealing with a malignancy that is not curable. We can treat this. With the newer protocols, I would like to think that we should be able to cause some tumor regression.  I think that FOLFIRINOX would be reasonable for her. She has a decent performance status. I probably would use slight dosage reduction of the FOLFIRINOX to help with side effects and would help with leukopenia.  I told her that she is going to lose her hair. I hate that part. She understands this.  My biggest concern is her having diarrhea and leukopenia. I probably would get her on prophylactic ciprofloxacin after treatment.  I told her that she would need to have a Port-A-Cath placed. I explained what a Port-A-Cath is. I told her that radiology would put this in for [place].  I think that we can probably get start of treatment the first week in January. We can get her through the holidays. She is due for a PET scan next week. Hopefully, our molecular markers will come back by then.  We will get her set up with chemotherapy teaching.  I will see her back when we start the second  cycle of treatment. The CA 19-9 will clearly tell us if she is responding. I told her that weight loss will also be a sign that the cancer is not responding to treatment.  I know that she has good support at home. Her husband is very very attentive and very motivated.     [name], MD 12/21/20212:31 PM

## 2020-07-25 NOTE — Progress Notes (Signed)
Per Dr Antonieta Pert request, oncotype MAP testing done on biopsy taken at [facility] test. Specimen id #[id number] DOS 07/13/20.  Patient to start treatment first week of January. Chemo education, port placement and initiation of treatment all scheduled.  Called and reviewed schedule/appointments with patient. Reviewed date, time and location of each. Reviewed port placement prep including arrival time, NPO and need for driver. Calendar printed and instruction written for each appointment and mailed for education reinforcement.   Oncology Nurse Navigator Documentation  Oncology Nurse Navigator Flowsheets 07/25/2020  Confirmed Diagnosis Date 07/13/2020  Diagnosis Status Additional Work Up  Phase of Treatment Chemo  Chemotherapy Pending- Reason: Oncologist Choice  Navigator Follow Up Date: 08/01/2020  Navigator Follow Up Reason: Scan Review  Navigator Location [facility]  Navigator Encounter Type Appt/Treatment Plan Review;Molecular Studies  Telephone -  Patient Visit Type MedOnc  Treatment Phase Pre-Tx/Tx Discussion  Barriers/Navigation Needs Coordination of International aid/development worker for Soil scientist;Other  Interventions Coordination of Care;Education;Psycho-Social Support  Acuity Level 2-Minimal Needs (1-2 Barriers Identified)  Coordination of Care Appts;Radiology  Education Method Verbal;Written  Support Groups/Services Friends and Family  Time Spent with Patient 62

## 2020-07-31 NOTE — Progress Notes (Signed)
Pharmacist Chemotherapy Monitoring - Initial Assessment    Anticipated start date: 08/09/20       Regimen:  . Are orders appropriate based on the patient's diagnosis, regimen, and cycle? Yes . Does the plan date match the patient's scheduled date? Yes . Is the sequencing of drugs appropriate? Yes . Are the premedications appropriate for the patient's regimen? Yes . Prior Authorization for treatment is: Approved o If applicable, is the correct biosimilar selected based on the patient's insurance? not applicable  Organ Function and Labs: Marland Kitchen Are dose adjustments needed based on the patient's renal function, hepatic function, or hematologic function? No . Are appropriate labs ordered prior to the start of patient's treatment? Yes . Other organ system assessment, if indicated: N/A . The following baseline labs, if indicated, have been ordered: N/A  Dose Assessment: . Are the drug doses appropriate? Yes . Are the following correct: o Drug concentrations Yes o IV fluid compatible with drug Yes o Administration routes Yes o Timing of therapy Yes . If applicable, does the patient have documented access for treatment and/or plans for port-a-cath placement?Yes . If applicable, have lifetime cumulative doses been properly documented and assessed? not applicable Lifetime Dose Tracking  No doses have been documented on this patient for the following tracked chemicals: Doxorubicin, Epirubicin, Idarubicin, Daunorubicin, Mitoxantrone, Bleomycin, Oxaliplatin, Carboplatin, Liposomal Doxorubicin  o   Toxicity Monitoring/Prevention: . The patient has the following take home antiemetics prescribed: Ondansetron, Prochlorperazine, Dexamethasone and Lorazepam . The patient has the following take home medications prescribed: antimicrobial prophylaxis and diarrhea prophylaxis . Medication allergies and previous infusion related reactions, if applicable, have been reviewed and addressed. Yes . The patient's  current medication list has been assessed for drug-drug interactions with their chemotherapy regimen. no significant drug-drug interactions were identified on review.  Order Review: . Are the treatment plan orders signed? Yes . Is the patient scheduled to see a provider prior to their treatment? No  I verify that I have reviewed each item in the above checklist and answered each question accordingly.  Revecca Nachtigal, Nevin Bloodgood 07/31/2020 4:53 PM

## 2020-08-01 ENCOUNTER — Encounter (HOSPITAL_COMMUNITY)
Admission: RE | Admit: 2020-08-01 | Discharge: 2020-08-01 | Disposition: A | Discharge status: Home / Self Care | Payer: Medicare Other | Source: Ambulatory Visit | Attending: Hematology & Oncology | Admitting: Hematology & Oncology

## 2020-08-01 ENCOUNTER — Encounter: Payer: Self-pay | Admitting: *Deleted

## 2020-08-01 ENCOUNTER — Other Ambulatory Visit: Payer: Self-pay

## 2020-08-01 DIAGNOSIS — C787 Secondary malignant neoplasm of liver and intrahepatic bile duct: Secondary | ICD-10-CM | POA: Insufficient documentation

## 2020-08-01 DIAGNOSIS — C801 Malignant (primary) neoplasm, unspecified: Secondary | ICD-10-CM | POA: Insufficient documentation

## 2020-08-01 DIAGNOSIS — C800 Disseminated malignant neoplasm, unspecified: Secondary | ICD-10-CM | POA: Diagnosis not present

## 2020-08-01 DIAGNOSIS — I2510 Atherosclerotic heart disease of native coronary artery without angina pectoris: Secondary | ICD-10-CM | POA: Insufficient documentation

## 2020-08-01 DIAGNOSIS — C786 Secondary malignant neoplasm of retroperitoneum and peritoneum: Secondary | ICD-10-CM | POA: Insufficient documentation

## 2020-08-01 DIAGNOSIS — I700 Atherosclerosis of aorta: Secondary | ICD-10-CM | POA: Diagnosis not present

## 2020-08-01 DIAGNOSIS — J432 Centrilobular emphysema: Secondary | ICD-10-CM | POA: Diagnosis not present

## 2020-08-01 DIAGNOSIS — R918 Other nonspecific abnormal finding of lung field: Secondary | ICD-10-CM | POA: Insufficient documentation

## 2020-08-01 LAB — GLUCOSE, CAPILLARY: Glucose-Capillary: 89 mg/dL (ref 70–99)

## 2020-08-01 MED ORDER — FLUDEOXYGLUCOSE F - 18 (FDG) INJECTION
7.2700 | Freq: Once | INTRAVENOUS | Status: AC
  Administered 2020-08-01: 7.27 via INTRAVENOUS

## 2020-08-01 NOTE — Progress Notes (Signed)
Oncology Nurse Navigator Documentation  Oncology Nurse Navigator Flowsheets 08/01/2020  Confirmed Diagnosis Date -  Diagnosis Status -  Phase of Treatment -  Chemotherapy Pending- Reason: -  Navigator Follow Up Date: 08/08/2020  Navigator Follow Up Reason: Chemo Class  Navigator Location [facility]  Navigator Encounter Type Scan Review  Telephone -  Patient Visit Type MedOnc  Treatment Phase Pre-Tx/Tx Discussion  Barriers/Navigation Needs Coordination of Care;Education  Education -  Interventions None Required  Acuity Level 2-Minimal Needs (1-2 Barriers Identified)  Coordination of Care -  Education Method -  Support Groups/Services Friends and Family  Time Spent with Patient 15

## 2020-08-02 ENCOUNTER — Other Ambulatory Visit: Payer: Self-pay | Admitting: *Deleted

## 2020-08-02 ENCOUNTER — Encounter: Payer: Self-pay | Admitting: *Deleted

## 2020-08-02 DIAGNOSIS — C259 Malignant neoplasm of pancreas, unspecified: Secondary | ICD-10-CM

## 2020-08-02 MED ORDER — ONDANSETRON HCL 8 MG PO TABS: 8.0000 mg | ORAL_TABLET | Freq: Two times a day (BID) | ORAL | 1 refills | Status: DC | PRN

## 2020-08-02 MED ORDER — LOPERAMIDE HCL 2 MG PO TABS: ORAL_TABLET | ORAL | 1 refills | Status: DC

## 2020-08-02 MED ORDER — DEXAMETHASONE 4 MG PO TABS: 8.0000 mg | ORAL_TABLET | Freq: Every day | ORAL | 5 refills | Status: DC

## 2020-08-02 MED ORDER — PROCHLORPERAZINE MALEATE 10 MG PO TABS: 10.0000 mg | ORAL_TABLET | Freq: Four times a day (QID) | ORAL | 1 refills | Status: DC | PRN

## 2020-08-02 MED ORDER — LIDOCAINE-PRILOCAINE 2.5-2.5 % EX CREA: TOPICAL_CREAM | CUTANEOUS | 3 refills | Status: DC

## 2020-08-02 NOTE — Progress Notes (Signed)
Called patient with the following message  the PET scan really does not show any pancreatic tumor, but the PET result is very consistent with pancreatic cancer. [facility]   Oncology Nurse Navigator Documentation  Oncology Nurse Navigator Flowsheets 08/02/2020  Confirmed Diagnosis Date -  Diagnosis Status -  Phase of Treatment -  Chemotherapy Pending- Reason: -  Navigator Follow Up Date: 08/08/2020  Navigator Follow Up Reason: Chemo Class  Navigator Location [facility]  Navigator Encounter Type Diagnostic Results;Telephone  Telephone Diagnostic Results  Patient Visit Type MedOnc  Treatment Phase Pre-Tx/Tx Discussion  Barriers/Navigation Needs Coordination of Care;Education  Education Other  Interventions Education;Psycho-Social Support  Acuity Level 2-Minimal Needs (1-2 Barriers Identified)  Coordination of Care -  Education Method Verbal  Support Groups/Services Friends and Family  Time Spent with Patient 15

## 2020-08-03 ENCOUNTER — Other Ambulatory Visit: Payer: Self-pay | Admitting: Student

## 2020-08-07 ENCOUNTER — Ambulatory Visit (HOSPITAL_COMMUNITY): Payer: Medicare Other

## 2020-08-07 ENCOUNTER — Ambulatory Visit (HOSPITAL_COMMUNITY)
Admission: RE | Admit: 2020-08-07 | Discharge: 2020-08-07 | Disposition: A | Discharge status: Home / Self Care | Payer: Medicare Other | Source: Ambulatory Visit | Attending: Hematology & Oncology | Admitting: Hematology & Oncology

## 2020-08-07 ENCOUNTER — Encounter (HOSPITAL_COMMUNITY): Payer: Self-pay

## 2020-08-07 ENCOUNTER — Other Ambulatory Visit: Payer: Self-pay | Admitting: Hematology & Oncology

## 2020-08-07 ENCOUNTER — Other Ambulatory Visit: Payer: Self-pay

## 2020-08-07 DIAGNOSIS — C787 Secondary malignant neoplasm of liver and intrahepatic bile duct: Secondary | ICD-10-CM

## 2020-08-07 DIAGNOSIS — C801 Malignant (primary) neoplasm, unspecified: Secondary | ICD-10-CM

## 2020-08-07 DIAGNOSIS — Z452 Encounter for adjustment and management of vascular access device: Secondary | ICD-10-CM | POA: Diagnosis not present

## 2020-08-07 DIAGNOSIS — Z8507 Personal history of malignant neoplasm of pancreas: Secondary | ICD-10-CM | POA: Insufficient documentation

## 2020-08-07 HISTORY — PX: IR IMAGING GUIDED PORT INSERTION: IMG5740

## 2020-08-07 MED ORDER — MIDAZOLAM HCL 2 MG/2ML IJ SOLN
INTRAMUSCULAR | Status: AC | PRN
  Administered 2020-08-07 (×2): 1 mg via INTRAVENOUS

## 2020-08-07 MED ORDER — MIDAZOLAM HCL 2 MG/2ML IJ SOLN
INTRAMUSCULAR | Status: AC
  Filled 2020-08-07: qty 4

## 2020-08-07 MED ORDER — CEFAZOLIN SODIUM-DEXTROSE 2-4 GM/100ML-% IV SOLN
INTRAVENOUS | Status: AC
  Administered 2020-08-07: 2 g via INTRAVENOUS
  Filled 2020-08-07: qty 100

## 2020-08-07 MED ORDER — SODIUM CHLORIDE 0.9 % IV SOLN: INTRAVENOUS | Status: DC

## 2020-08-07 MED ORDER — FENTANYL CITRATE (PF) 100 MCG/2ML IJ SOLN
INTRAMUSCULAR | Status: AC
  Filled 2020-08-07: qty 2

## 2020-08-07 MED ORDER — HEPARIN SOD (PORK) LOCK FLUSH 100 UNIT/ML IV SOLN
INTRAVENOUS | Status: AC
  Filled 2020-08-07: qty 5

## 2020-08-07 MED ORDER — LIDOCAINE-EPINEPHRINE 1 %-1:100000 IJ SOLN
INTRAMUSCULAR | Status: AC
  Filled 2020-08-07: qty 1

## 2020-08-07 MED ORDER — CEFAZOLIN SODIUM-DEXTROSE 2-4 GM/100ML-% IV SOLN: 2.0000 g | INTRAVENOUS | Status: AC

## 2020-08-07 MED ORDER — FENTANYL CITRATE (PF) 100 MCG/2ML IJ SOLN
INTRAMUSCULAR | Status: AC | PRN
  Administered 2020-08-07: 50 ug via INTRAVENOUS

## 2020-08-07 MED ORDER — LIDOCAINE-EPINEPHRINE 1 %-1:100000 IJ SOLN
INTRAMUSCULAR | Status: AC | PRN
  Administered 2020-08-07: 10 mL

## 2020-08-07 MED ORDER — HEPARIN SOD (PORK) LOCK FLUSH 100 UNIT/ML IV SOLN
INTRAVENOUS | Status: AC | PRN
  Administered 2020-08-07: 500 [IU] via INTRAVENOUS

## 2020-08-07 NOTE — H&P (Addendum)
Referring Physician(s): Ennever,Peter R  Supervising Physician: Simonne Come  Patient Status:  [facility] OP  Chief Complaint:  "I'm here for a port a cath"  Subjective: Patient familiar to IR service from liver lesion biopsy on 07/13/2020 at [facility].  Pathology revealed metastatic adenocarcinoma, unknown primary but pancreatic primary suspected.  CA 19-9  is over 8000.  Also with prior hx CLL that did not require treatment. She presents today for Port-A-Cath placement for chemotherapy.  She currently denies fever, headache, chest pain, dyspnea, cough, back pain, nausea, vomiting or bleeding.  She does have some intermittent abdominal discomfort, primarily lower abdominal region.  She has also had some weight loss.  Additional history as below.  Past Medical History:  Diagnosis Date  . Cancer with unknown primary site (HCC) 07/19/2020  . Goals of care, counseling/discussion 07/19/2020  . Metastasis to liver with unknown primary site [facility]) 07/19/2020  . Pancreatic cancer metastasized to liver (HCC) 07/25/2020  . Pancreatic cancer metastasized to lung (HCC) 07/25/2020  . Skin cancer    melanoma found on abdomen and removed by Dr. Michell Heinrich; followed by dermatology every 6 months   History reviewed. No pertinent surgical history.    Allergies: Alendronate, Ibandronic acid, and Latex  Medications: Prior to Admission medications   Medication Sig Start Date End Date Taking? Authorizing Provider  calcium carbonate (OS-CAL) 600 MG TABS Take 600 mg by mouth 2 (two) times daily with a meal.   Yes [provider]  Cholecalciferol 25 MCG (1000 UT) tablet 1,000 Units daily. 07/09/20  Yes [provider]  co-enzyme Q-10 30 MG capsule Take 100 mg by mouth 2 (two) times daily.   Yes [provider]  COLLAGEN PO Take by mouth. One scoop of powder daily in coffee.   Yes [provider]  fish oil-omega-3 fatty acids 1000 MG capsule Take 1 g by mouth 2 (two)  times daily.   Yes [provider]  Chilton Si Tea, Camellia sinensis, (GREEN TEA EXTRACT PO) Take by mouth. Takes 3 caps daily.   Yes [provider]  Multiple Vitamin (MULTIVITAMIN) tablet Take 1 tablet by mouth daily.   Yes [provider]  Nutritional Supplements (GRAPESEED EXTRACT) 500-50 MG CAPS Take by mouth 2 (two) times daily.   Yes [provider]  OVER THE COUNTER MEDICATION Take by mouth 2 (two) times daily. WELLNESS FORMULA CAPSULE--HAIR SKIN NAILS CAPSULES.   Yes [provider]  OVER THE COUNTER MEDICATION Green Vibrance daily.   Yes [provider]  Turmeric 450 MG CAPS Take by mouth every morning.   Yes [provider]  vitamin E 180 MG (400 UNITS) capsule 400 Units daily. 07/09/20  Yes [provider]  dexamethasone (DECADRON) 4 MG tablet Take 2 tablets (8 mg total) by mouth daily. Start the day after chemotherapy for 3 days. Take with food. 08/02/20   Josph Macho, MD  lidocaine-prilocaine (EMLA) cream Apply to affected area once 08/02/20   Josph Macho, MD  loperamide (IMODIUM A-D) 2 MG tablet Take 2 at onset of diarrhea, then 1 every 2hrs until 12hr without a BM. May take 2 tab every 4hrs at bedtime. If diarrhea recurs repeat. 08/02/20   Josph Macho, MD  ondansetron (ZOFRAN) 8 MG tablet Take 1 tablet (8 mg total) by mouth 2 (two) times daily as needed. Start on day 3 after chemotherapy. 08/02/20   Josph Macho, MD  prochlorperazine (COMPAZINE) 10 MG tablet Take 1 tablet (10 mg  total) by mouth every 6 (six) hours as needed (Nausea or vomiting). 08/02/20   [name], MD     Vital Signs: Vitals:   08/07/20 1408  BP: (!) 123/55  Pulse: 83  Resp: 18  Temp: 98.1 F (36.7 C)  SpO2: 98%      Physical Exam awake, alert.  Chest clear to auscultation bilaterally.  Heart with regular rate and rhythm.  Abdomen soft, positive bowel sounds, some mild lower abdominal tenderness to palpation.   No lower extremity edema.  Imaging: No results found.  Labs:  CBC: Recent Labs    03/10/20 0851 07/19/20 1446  WBC 21.5* 20.6*  HGB 12.6 11.0*  HCT 36.9 33.7*  PLT 170 215    COAGS: No results for input(s): INR, APTT in the last 8760 hours.  BMP: Recent Labs    03/10/20 0851 07/19/20 1446  NA 140 137  K 4.6 4.0  CL 104 101  CO2 30 29  GLUCOSE 102* 111*  BUN 11 12  CALCIUM 9.3 9.1  CREATININE 0.90 0.91  GFRNONAA >60 >60  GFRAA >60  --     LIVER FUNCTION TESTS: Recent Labs    03/10/20 0851 07/19/20 1446  BILITOT 1.1 0.4  AST 17 16  ALT 11 15  ALKPHOS 43 77  PROT 6.0* 6.2*  ALBUMIN 4.4 4.3    Assessment and Plan: Patient familiar to IR service from liver lesion biopsy on 07/13/2020 at [facility].  Pathology revealed metastatic adenocarcinoma, unknown primary but pancreatic primary suspected.  CA 19-9  is over 8000.   Also with prior hx CLL that did not require treatment.She presents today for Port-A-Cath placement for chemotherapy.  Risks and benefits of image guided port-a-catheter placement was discussed with the patient including, but not limited to bleeding, infection, pneumothorax, or fibrin sheath development and need for additional procedures.  All of the patient's questions were answered, patient is agreeable to proceed. Consent signed and in chart.     Electronically Signed: D. Rowe Robert, PA-C 08/07/2020, 2:09 PM   I spent a total of 25 minutes at the the patient's bedside AND on the patient's hospital floor or unit, greater than 50% of which was counseling/coordinating care for Port-A-Cath placement

## 2020-08-07 NOTE — Procedures (Signed)
Pre Procedure Dx: Poor venous access Post Procedural Dx: Same  Successful placement of right IJ approach port-a-cath with tip at the superior caval atrial junction. The catheter is ready for immediate use.  Estimated Blood Loss: Minimal  Complications: None immediate.  Jay Averianna Brugger, MD Pager #: [phone]   

## 2020-08-07 NOTE — Discharge Instructions (Signed)

## 2020-08-08 ENCOUNTER — Encounter: Payer: Self-pay | Admitting: *Deleted

## 2020-08-08 ENCOUNTER — Inpatient Hospital Stay: Payer: Medicare Other | Attending: Hematology & Oncology

## 2020-08-08 DIAGNOSIS — Z79899 Other long term (current) drug therapy: Secondary | ICD-10-CM | POA: Insufficient documentation

## 2020-08-08 DIAGNOSIS — C7800 Secondary malignant neoplasm of unspecified lung: Secondary | ICD-10-CM | POA: Insufficient documentation

## 2020-08-08 DIAGNOSIS — C259 Malignant neoplasm of pancreas, unspecified: Secondary | ICD-10-CM | POA: Insufficient documentation

## 2020-08-08 DIAGNOSIS — C787 Secondary malignant neoplasm of liver and intrahepatic bile duct: Secondary | ICD-10-CM | POA: Insufficient documentation

## 2020-08-08 DIAGNOSIS — C9110 Chronic lymphocytic leukemia of B-cell type not having achieved remission: Secondary | ICD-10-CM | POA: Insufficient documentation

## 2020-08-08 DIAGNOSIS — Z5111 Encounter for antineoplastic chemotherapy: Secondary | ICD-10-CM | POA: Insufficient documentation

## 2020-08-08 NOTE — Progress Notes (Signed)
Patient in chemotherapy education class with  Husband.  Discussed side effects of 5FU, Leucovorin, Oxaliplatin, Irinotecan which include but are not limited to myelosuppression, decreased appetite, fatigue, fever, allergic or infusional reaction, mucositis, cardiac toxicity, cough, SOB, altered taste, nausea and vomiting, diarrhea, constipation, elevated LFTs myalgia and arthralgias, hair loss or thinning, rash, skin dryness, nail changes, peripheral neuropathy, discolored urine, delayed wound healing, mental changes (Chemo brain), increased risk of infections, weight loss.  Reviewed infusion room and office policy and procedure and phone numbers 24 hours x 7 days a week.  Reviewed ambulatory pump specifics and how to manage safe handling at home.  Reviewed when to call the office with any concerns or problems.  Transport planner given.  Discussed portacath insertion and EMLA cream administration.  Antiemetic protocol and chemotherapy schedule reviewed. Patient verbalized understanding of chemotherapy indications and possible side effects.  Teachback done

## 2020-08-09 ENCOUNTER — Inpatient Hospital Stay: Payer: Medicare Other

## 2020-08-09 ENCOUNTER — Other Ambulatory Visit: Payer: Self-pay | Admitting: Hematology & Oncology

## 2020-08-09 ENCOUNTER — Other Ambulatory Visit: Payer: Self-pay

## 2020-08-09 ENCOUNTER — Encounter: Payer: Self-pay | Admitting: *Deleted

## 2020-08-09 VITALS — BP 128/72 | HR 88 | Temp 97.8°F | Resp 18

## 2020-08-09 DIAGNOSIS — C7800 Secondary malignant neoplasm of unspecified lung: Secondary | ICD-10-CM | POA: Diagnosis not present

## 2020-08-09 DIAGNOSIS — C259 Malignant neoplasm of pancreas, unspecified: Secondary | ICD-10-CM | POA: Diagnosis not present

## 2020-08-09 DIAGNOSIS — Z79899 Other long term (current) drug therapy: Secondary | ICD-10-CM | POA: Diagnosis not present

## 2020-08-09 DIAGNOSIS — C9110 Chronic lymphocytic leukemia of B-cell type not having achieved remission: Secondary | ICD-10-CM | POA: Diagnosis not present

## 2020-08-09 DIAGNOSIS — C787 Secondary malignant neoplasm of liver and intrahepatic bile duct: Secondary | ICD-10-CM

## 2020-08-09 DIAGNOSIS — Z5111 Encounter for antineoplastic chemotherapy: Secondary | ICD-10-CM | POA: Diagnosis not present

## 2020-08-09 DIAGNOSIS — Z95828 Presence of other vascular implants and grafts: Secondary | ICD-10-CM

## 2020-08-09 LAB — CMP (CANCER CENTER ONLY)
ALT: 10 U/L (ref 0–44)
AST: 17 U/L (ref 15–41)
Albumin: 4 g/dL (ref 3.5–5.0)
Alkaline Phosphatase: 73 U/L (ref 38–126)
Anion gap: 6 (ref 5–15)
BUN: 16 mg/dL (ref 8–23)
CO2: 28 mmol/L (ref 22–32)
Calcium: 9.3 mg/dL (ref 8.9–10.3)
Chloride: 102 mmol/L (ref 98–111)
Creatinine: 0.78 mg/dL (ref 0.44–1.00)
GFR, Estimated: >60 mL/min (ref >60)
Glucose, Bld: 100 mg/dL — ABNORMAL HIGH (ref 70–99)
Potassium: 4.3 mmol/L (ref 3.5–5.1)
Sodium: 136 mmol/L (ref 135–145)
Total Bilirubin: 0.7 mg/dL (ref 0.3–1.2)
Total Protein: 5.9 g/dL — ABNORMAL LOW (ref 6.5–8.1)

## 2020-08-09 LAB — CBC WITH DIFFERENTIAL (CANCER CENTER ONLY)
Abs Immature Granulocytes: 0.03 10*3/uL (ref 0.00–0.07)
Basophils Absolute: 0 10*3/uL (ref 0.0–0.1)
Basophils Relative: 0 %
Eosinophils Absolute: 0.3 10*3/uL (ref 0.0–0.5)
Eosinophils Relative: 2 %
HCT: 35.8 % — ABNORMAL LOW (ref 36.0–46.0)
Hemoglobin: 11.7 g/dL — ABNORMAL LOW (ref 12.0–15.0)
Immature Granulocytes: 0 %
Lymphocytes Relative: 81 %
Lymphs Abs: 17.1 10*3/uL — ABNORMAL HIGH (ref 0.7–4.0)
MCH: 31.2 pg (ref 26.0–34.0)
MCHC: 32.7 g/dL (ref 30.0–36.0)
MCV: 95.5 fL (ref 80.0–100.0)
Monocytes Absolute: 0.3 10*3/uL (ref 0.1–1.0)
Monocytes Relative: 1 %
Neutro Abs: 3.4 10*3/uL (ref 1.7–7.7)
Neutrophils Relative %: 16 %
Platelet Count: 173 10*3/uL (ref 150–400)
RBC: 3.75 MIL/uL — ABNORMAL LOW (ref 3.87–5.11)
RDW: 14.2 % (ref 11.5–15.5)
WBC Count: 21.2 10*3/uL — ABNORMAL HIGH (ref 4.0–10.5)
nRBC: 0 % (ref 0.0–0.2)

## 2020-08-09 MED ORDER — SODIUM CHLORIDE 0.9 % IV SOLN
150.0000 mg | Freq: Once | INTRAVENOUS | Status: AC
  Administered 2020-08-09: 150 mg via INTRAVENOUS
  Filled 2020-08-09: qty 150

## 2020-08-09 MED ORDER — PALONOSETRON HCL INJECTION 0.25 MG/5ML
0.2500 mg | Freq: Once | INTRAVENOUS | Status: AC
  Administered 2020-08-09: 0.25 mg via INTRAVENOUS

## 2020-08-09 MED ORDER — OXALIPLATIN CHEMO INJECTION 100 MG/20ML
68.0000 mg/m2 | Freq: Once | INTRAVENOUS | Status: AC
  Administered 2020-08-09: 120 mg via INTRAVENOUS
  Filled 2020-08-09: qty 20

## 2020-08-09 MED ORDER — SODIUM CHLORIDE 0.9 % IV SOLN
1920.0000 mg/m2 | INTRAVENOUS | Status: DC
  Administered 2020-08-09: 3400 mg via INTRAVENOUS
  Filled 2020-08-09: qty 68

## 2020-08-09 MED ORDER — SODIUM CHLORIDE 0.9% FLUSH
10.0000 mL | INTRAVENOUS | Status: DC | PRN
  Administered 2020-08-09: 10 mL via INTRAVENOUS
  Filled 2020-08-09: qty 10

## 2020-08-09 MED ORDER — HEPARIN SOD (PORK) LOCK FLUSH 100 UNIT/ML IV SOLN
500.0000 [IU] | Freq: Once | INTRAVENOUS | Status: AC
  Administered 2020-08-09: 500 [IU] via INTRAVENOUS
  Filled 2020-08-09: qty 5

## 2020-08-09 MED ORDER — SODIUM CHLORIDE 0.9 % IV SOLN
10.0000 mg | Freq: Once | INTRAVENOUS | Status: AC
  Administered 2020-08-09: 10 mg via INTRAVENOUS
  Filled 2020-08-09: qty 10

## 2020-08-09 MED ORDER — SODIUM CHLORIDE 0.9 % IV SOLN
120.0000 mg/m2 | Freq: Once | INTRAVENOUS | Status: AC
  Administered 2020-08-09: 220 mg via INTRAVENOUS
  Filled 2020-08-09: qty 5

## 2020-08-09 MED ORDER — PALONOSETRON HCL INJECTION 0.25 MG/5ML
INTRAVENOUS | Status: AC
  Filled 2020-08-09: qty 5

## 2020-08-09 MED ORDER — ATROPINE SULFATE 1 MG/ML IJ SOLN
INTRAMUSCULAR | Status: AC
  Filled 2020-08-09: qty 1

## 2020-08-09 MED ORDER — HEPARIN SOD (PORK) LOCK FLUSH 100 UNIT/ML IV SOLN
500.0000 [IU] | Freq: Once | INTRAVENOUS | Status: DC | PRN
  Filled 2020-08-09: qty 5

## 2020-08-09 MED ORDER — SODIUM CHLORIDE 0.9% FLUSH
10.0000 mL | INTRAVENOUS | Status: DC | PRN
  Filled 2020-08-09: qty 10

## 2020-08-09 MED ORDER — HEPARIN SOD (PORK) LOCK FLUSH 100 UNIT/ML IV SOLN
500.0000 [IU] | Freq: Once | INTRAVENOUS | Status: DC
  Filled 2020-08-09: qty 5

## 2020-08-09 MED ORDER — DEXTROSE 5 % IV SOLN
Freq: Once | INTRAVENOUS | Status: AC
  Filled 2020-08-09: qty 250

## 2020-08-09 MED ORDER — ATROPINE SULFATE 1 MG/ML IJ SOLN
0.5000 mg | Freq: Once | INTRAMUSCULAR | Status: AC | PRN
  Administered 2020-08-09: 0.5 mg via INTRAVENOUS

## 2020-08-09 MED ORDER — SODIUM CHLORIDE 0.9 % IV SOLN
400.0000 mg/m2 | Freq: Once | INTRAVENOUS | Status: AC
  Administered 2020-08-09: 708 mg via INTRAVENOUS
  Filled 2020-08-09: qty 35.4

## 2020-08-09 NOTE — Patient Instructions (Signed)

## 2020-08-09 NOTE — Progress Notes (Signed)
Pt discharged in no apparent distress. Pt left ambulatory without assistance. Pt aware of discharge instructions and verbalized understanding and had no further questions.  

## 2020-08-09 NOTE — Patient Instructions (Signed)
[facility] Discharge Instructions for Patients Receiving Chemotherapy  Today you received the following chemotherapy agents  :Oxaliplatin, Leukovorin, 5FU  To help prevent nausea and vomiting after your treatment, we encourage you to take your nausea medication  As directed   If you develop nausea and vomiting that is not controlled by your nausea medication, call the clinic.   BELOW ARE SYMPTOMS THAT SHOULD BE REPORTED IMMEDIATELY:  *FEVER GREATER THAN 100.5 F  *CHILLS WITH OR WITHOUT FEVER  NAUSEA AND VOMITING THAT IS NOT CONTROLLED WITH YOUR NAUSEA MEDICATION  *UNUSUAL SHORTNESS OF BREATH  *UNUSUAL BRUISING OR BLEEDING  TENDERNESS IN MOUTH AND THROAT WITH OR WITHOUT PRESENCE OF ULCERS  *URINARY PROBLEMS  *BOWEL PROBLEMS  UNUSUAL RASH Items with * indicate a potential emergency and should be followed up as soon as possible.  Feel free to call the clinic should you have any questions or concerns. The clinic phone number is [phone].  Please show the CHEMO ALERT CARD at check-in to the Emergency Department and triage nurse.   

## 2020-08-09 NOTE — Progress Notes (Signed)
Visited with patient prior to her starting treatment. She is ready to begin. Her port placement went well. She states she does have some itching at the site. She states the chemo education was very helpful for her and her husband and doesn't currently have any questions. She knows to reach out to the office with any questions or concerns.   Oncology Nurse Navigator Documentation  Oncology Nurse Navigator Flowsheets 08/09/2020  Confirmed Diagnosis Date -  Diagnosis Status -  Phase of Treatment Chemo  Chemotherapy Pending- Reason: -  Chemotherapy Actual Start Date: 08/09/2020  Navigator Follow Up Date: 08/23/2020  Navigator Follow Up Reason: Follow-up Appointment;Chemotherapy  Navigator Radiographer, therapeutic Encounter Type Treatment  Telephone -  Treatment Initiated Date 08/09/2020  Patient Visit Type MedOnc  Treatment Phase First Chemo Tx  Barriers/Navigation Needs Coordination of Care;Education  Education -  Interventions Psycho-Social Support  Acuity Level 2-Minimal Needs (1-2 Barriers Identified)  Coordination of Care -  Education Method -  Support Groups/Services Friends and Family  Time Spent with Patient 30

## 2020-08-10 ENCOUNTER — Other Ambulatory Visit: Payer: Self-pay | Admitting: Hematology & Oncology

## 2020-08-11 ENCOUNTER — Other Ambulatory Visit: Payer: Self-pay

## 2020-08-11 ENCOUNTER — Inpatient Hospital Stay: Payer: Medicare Other

## 2020-08-11 VITALS — BP 131/60 | HR 92 | Temp 97.8°F | Resp 18

## 2020-08-11 DIAGNOSIS — C9110 Chronic lymphocytic leukemia of B-cell type not having achieved remission: Secondary | ICD-10-CM | POA: Diagnosis not present

## 2020-08-11 DIAGNOSIS — C259 Malignant neoplasm of pancreas, unspecified: Secondary | ICD-10-CM

## 2020-08-11 DIAGNOSIS — C787 Secondary malignant neoplasm of liver and intrahepatic bile duct: Secondary | ICD-10-CM | POA: Diagnosis not present

## 2020-08-11 DIAGNOSIS — Z79899 Other long term (current) drug therapy: Secondary | ICD-10-CM | POA: Diagnosis not present

## 2020-08-11 DIAGNOSIS — Z5111 Encounter for antineoplastic chemotherapy: Secondary | ICD-10-CM | POA: Diagnosis not present

## 2020-08-11 DIAGNOSIS — C7800 Secondary malignant neoplasm of unspecified lung: Secondary | ICD-10-CM | POA: Diagnosis not present

## 2020-08-11 MED ORDER — HEPARIN SOD (PORK) LOCK FLUSH 100 UNIT/ML IV SOLN
500.0000 [IU] | Freq: Once | INTRAVENOUS | Status: AC | PRN
  Administered 2020-08-11: 500 [IU]
  Filled 2020-08-11: qty 5

## 2020-08-11 MED ORDER — SODIUM CHLORIDE 0.9% FLUSH
10.0000 mL | INTRAVENOUS | Status: DC | PRN
  Administered 2020-08-11: 10 mL
  Filled 2020-08-11: qty 10

## 2020-08-11 NOTE — Patient Instructions (Signed)
Tunneled Central Venous Catheter Flushing Guide  It is important to flush your tunneled central venous catheter each time you use it, both before and after you use it. Flushing your catheter will help prevent it from clogging. What are the risks? Risks may include:  Infection.  Air getting into the catheter and bloodstream. Supplies needed:  A clean pair of gloves.  A disinfecting wipe. Use an alcohol wipe, chlorhexidine wipe, or iodine wipe as told by your health care provider.  A 10 mL syringe that has been prefilled with saline solution.  An empty 10 mL syringe, if a substance called heparin was injected into your catheter. How to flush your catheter When you flush your catheter, make sure you follow any specific instructions from your health care provider or the manufacturer. These are general guidelines. Flushing your catheter before use If there is heparin in your catheter: 1. Wash your hands with soap and water. 2. Put on gloves. 3. Scrub the injection cap for a minimum of 15 seconds with a disinfecting wipe. 4. Unclamp the catheter. 5. Attach the empty syringe to the injection cap. 6. Pull the syringe plunger back and withdraw 10 mL of blood. 7. Place the syringe into an appropriate waste container. 8. Scrub the injection cap for 15 seconds with a disinfecting wipe. 9. Attach the prefilled syringe to the injection cap. 10. Flush the catheter by pushing the plunger forward until all the liquid from the syringe is in the catheter. 11. Remove the syringe from the injection cap. 12. Clamp the catheter. If there is no heparin in your catheter: 1. Wash your hands with soap and water. 2. Put on gloves. 3. Scrub the injection cap for 15 seconds with a disinfecting wipe. 4. Unclamp the catheter. 5. Attach the prefilled syringe to the injection cap. 6. Flush the catheter by pushing the plunger forward until 5 mL of the liquid from the syringe is in the catheter. 7. Pull back on  the syringe until you see blood in the catheter. 8. If you have been asked to collect any blood, follow your health care provider's instructions. Otherwise, flush the catheter with the rest of the solution from the syringe. 9. Remove the syringe from the injection cap. 10. Clamp the catheter.  Flushing your catheter after use 1. Wash your hands with soap and water. 2. Put on gloves. 3. Scrub the injection cap for 15 seconds with a disinfecting wipe. 4. Unclamp the catheter. 5. Attach the prefilled syringe to the injection cap. 6. Flush the catheter by pushing the plunger forward until all of the liquid from the syringe is in the catheter. 7. Remove the syringe from the injection cap. 8. Clamp the catheter. Problems and solutions  If blood cannot be completely cleared from the injection cap, you may need to have the injection cap replaced.  If the catheter is difficult to flush, use the pulsing method. The pulsing method involves pushing only a few milliliters of solution into the catheter at a time and pausing between pushes.  If you do not see blood in the catheter when you pull back on the syringe, change your body position, such as by raising your arms above your head. Take a deep breath and cough. Then, pull back on the syringe. If you still do not see blood, flush the catheter with a small amount of solution. Then, change positions again and take a breath or cough. Pull back on the syringe again. If you still do not see   blood, finish flushing the catheter and contact your health care provider. Do not use your catheter until your health care provider says it is okay. General tips  Have someone help you flush your catheter, if possible.  Do not force fluid through your catheter.  Do not use a syringe that is larger or smaller than 10 mL. Using a smaller syringe can make the catheter burst.  Do not use your catheter without flushing it first if it has heparin in it. Contact a health  care provider if:  You cannot see any blood in the catheter when you flush it before using it.  Your catheter is difficult to flush. Get help right away if:  You cannot flush the catheter.  The catheter leaks when you flush it or when there is fluid in it.  There are cracks or breaks in the catheter. Summary  It is important to flush your tunneled central venous catheter each time you use it, both before and after you use it.  Scrub the injection cap for 15 seconds with a disinfecting wipe before and after you flush it.  When you flush your catheter, make sure you follow any specific instructions from your health care provider or the manufacturer.  Get help right away if you cannot flush the catheter. This information is not intended to replace advice given to you by your health care provider. Make sure you discuss any questions you have with your health care provider. Document Revised: 04/16/2019 Document Reviewed: 10/07/2018 Elsevier Patient Education  2020 [employer]  

## 2020-08-15 ENCOUNTER — Encounter: Payer: Self-pay | Admitting: *Deleted

## 2020-08-15 NOTE — Progress Notes (Signed)
Patient called requesting side effect management. She received her first treatment last week. She c/o feeling shaky and not sleeping well. She also has pain, low in her pelvis when having a BM, and then for a time afterwards. She has also had some nausea after eating with one episode of vomiting.   Patient had been taking decadron for several days after her chemo, and explained that this can have an affect on sleep and cause restless energy. Patient has finished her course for this treatment. She mentions possibly being prescribed a "cannabis" pill but wants to wait and see if the symptoms clear on their own.  Patient has pain medicine at home, hydrocodone, and she wants to know if she can use this for her pain. I instructed her to take the medication per prescription instructions as needed for pain. Her BMs are at baseline right now without constipation or diarrhea.   Instructed patient to take her nausea medications 30 minutes prior to meals. She has an appetite and wants to eat, but sometimes feels nauseous afterward. Asked her to follow this schedule and let us know how her nausea is controlled going forward.   Oncology Nurse Navigator Documentation  Oncology Nurse Navigator Flowsheets 08/15/2020  Confirmed Diagnosis Date -  Diagnosis Status -  Phase of Treatment -  Chemotherapy Pending- Reason: -  Chemotherapy Actual Start Date: -  Navigator Follow Up Date: 08/23/2020  Navigator Follow Up Reason: Follow-up Appointment  Navigator Location [facility]  Navigator Encounter Type Telephone  Telephone Incoming Call;Patient Update;Symptom Mgt  Treatment Initiated Date -  Patient Visit Type MedOnc  Treatment Phase Active Tx  Barriers/Navigation Needs Coordination of Care;Education  Education Pain/ Symptom Management  Interventions Education;Psycho-Social Support  Acuity Level 2-Minimal Needs (1-2 Barriers Identified)  Coordination of Care -  Education Method Verbal  Support  Groups/Services Friends and Family  Time Spent with Patient 30

## 2020-08-18 ENCOUNTER — Other Ambulatory Visit: Payer: Self-pay | Admitting: *Deleted

## 2020-08-18 ENCOUNTER — Encounter: Payer: Self-pay | Admitting: *Deleted

## 2020-08-18 DIAGNOSIS — C259 Malignant neoplasm of pancreas, unspecified: Secondary | ICD-10-CM

## 2020-08-18 DIAGNOSIS — C787 Secondary malignant neoplasm of liver and intrahepatic bile duct: Secondary | ICD-10-CM

## 2020-08-18 MED ORDER — METRONIDAZOLE 500 MG PO TABS: 500.0000 mg | ORAL_TABLET | Freq: Three times a day (TID) | ORAL | 0 refills | Status: AC

## 2020-08-18 NOTE — Progress Notes (Signed)
Patient continues to have moderate to severe pain with BMs. She states the pain is low pelvic pain and starts when she has a BM but continues to a less severity for awhile after the BM. They are soft. Shes having several a day. She took hydrocodone earlier this week without any decrease in pain, but yesterday took oxycodone and this did relieve her pain. She's wondering if there is something else she should be doing/taking  Spoke to Dr Marin Olp regarding patient symptoms. He would like to trial patient on Flagyl. Patient is aware of new prescription. Pharmacy confirmed.   Oncology Nurse Navigator Documentation  Oncology Nurse Navigator Flowsheets 08/18/2020  Confirmed Diagnosis Date -  Diagnosis Status -  Phase of Treatment -  Chemotherapy Pending- Reason: -  Chemotherapy Actual Start Date: -  Navigator Follow Up Date: 08/23/2020  Navigator Follow Up Reason: Follow-up Appointment;Chemotherapy  Production assistant, radio Encounter Type Telephone  Telephone Symptom Mgt;Incoming Call  Treatment Initiated Date -  Patient Visit Type MedOnc  Treatment Phase Active Tx  Barriers/Navigation Needs Coordination of Care;Education  Education Pain/ Symptom Management  Interventions Education;Psycho-Social Support  Acuity Level 2-Minimal Needs (1-2 Barriers Identified)  Coordination of Care -  Education Method Verbal  Support Groups/Services Friends and Family  Time Spent with Patient 68

## 2020-08-23 ENCOUNTER — Inpatient Hospital Stay (HOSPITAL_BASED_OUTPATIENT_CLINIC_OR_DEPARTMENT_OTHER): Payer: Medicare Other | Admitting: Hematology & Oncology

## 2020-08-23 ENCOUNTER — Inpatient Hospital Stay: Payer: Medicare Other

## 2020-08-23 ENCOUNTER — Encounter: Payer: Self-pay | Admitting: Hematology & Oncology

## 2020-08-23 ENCOUNTER — Other Ambulatory Visit: Payer: Self-pay

## 2020-08-23 ENCOUNTER — Encounter: Payer: Self-pay | Admitting: *Deleted

## 2020-08-23 ENCOUNTER — Telehealth: Payer: Self-pay

## 2020-08-23 VITALS — BP 120/53 | HR 91 | Temp 97.6°F | Resp 17 | Wt 140.1 lb

## 2020-08-23 DIAGNOSIS — C7800 Secondary malignant neoplasm of unspecified lung: Secondary | ICD-10-CM | POA: Diagnosis not present

## 2020-08-23 DIAGNOSIS — C787 Secondary malignant neoplasm of liver and intrahepatic bile duct: Secondary | ICD-10-CM

## 2020-08-23 DIAGNOSIS — C259 Malignant neoplasm of pancreas, unspecified: Secondary | ICD-10-CM

## 2020-08-23 DIAGNOSIS — C9110 Chronic lymphocytic leukemia of B-cell type not having achieved remission: Secondary | ICD-10-CM | POA: Diagnosis not present

## 2020-08-23 DIAGNOSIS — Z5111 Encounter for antineoplastic chemotherapy: Secondary | ICD-10-CM | POA: Diagnosis not present

## 2020-08-23 DIAGNOSIS — Z79899 Other long term (current) drug therapy: Secondary | ICD-10-CM | POA: Diagnosis not present

## 2020-08-23 LAB — CBC WITH DIFFERENTIAL (CANCER CENTER ONLY)
Abs Immature Granulocytes: 0.01 10*3/uL (ref 0.00–0.07)
Basophils Absolute: 0 10*3/uL (ref 0.0–0.1)
Basophils Relative: 0 %
Eosinophils Absolute: 0.1 10*3/uL (ref 0.0–0.5)
Eosinophils Relative: 1 %
HCT: 33.4 % — ABNORMAL LOW (ref 36.0–46.0)
Hemoglobin: 11.3 g/dL — ABNORMAL LOW (ref 12.0–15.0)
Immature Granulocytes: 0 %
Lymphocytes Relative: 89 %
Lymphs Abs: 17 10*3/uL — ABNORMAL HIGH (ref 0.7–4.0)
MCH: 31.6 pg (ref 26.0–34.0)
MCHC: 33.8 g/dL (ref 30.0–36.0)
MCV: 93.3 fL (ref 80.0–100.0)
Monocytes Absolute: 0.9 10*3/uL (ref 0.1–1.0)
Monocytes Relative: 5 %
Neutro Abs: 1 10*3/uL — ABNORMAL LOW (ref 1.7–7.7)
Neutrophils Relative %: 5 %
Platelet Count: 223 10*3/uL (ref 150–400)
RBC: 3.58 MIL/uL — ABNORMAL LOW (ref 3.87–5.11)
RDW: 14.1 % (ref 11.5–15.5)
WBC Count: 19.1 10*3/uL — ABNORMAL HIGH (ref 4.0–10.5)
nRBC: 0 % (ref 0.0–0.2)

## 2020-08-23 LAB — CMP (CANCER CENTER ONLY)
ALT: 16 U/L (ref 0–44)
AST: 19 U/L (ref 15–41)
Albumin: 3.8 g/dL (ref 3.5–5.0)
Alkaline Phosphatase: 84 U/L (ref 38–126)
Anion gap: 8 (ref 5–15)
BUN: 13 mg/dL (ref 8–23)
CO2: 27 mmol/L (ref 22–32)
Calcium: 9 mg/dL (ref 8.9–10.3)
Chloride: 102 mmol/L (ref 98–111)
Creatinine: 0.76 mg/dL (ref 0.44–1.00)
GFR, Estimated: >60 mL/min (ref >60)
Glucose, Bld: 111 mg/dL — ABNORMAL HIGH (ref 70–99)
Potassium: 4 mmol/L (ref 3.5–5.1)
Sodium: 137 mmol/L (ref 135–145)
Total Bilirubin: 0.5 mg/dL (ref 0.3–1.2)
Total Protein: 5.8 g/dL — ABNORMAL LOW (ref 6.5–8.1)

## 2020-08-23 MED ORDER — IRINOTECAN HCL CHEMO INJECTION 100 MG/5ML
120.0000 mg/m2 | Freq: Once | INTRAVENOUS | Status: AC
  Administered 2020-08-23: 220 mg via INTRAVENOUS
  Filled 2020-08-23: qty 5

## 2020-08-23 MED ORDER — SODIUM CHLORIDE 0.9 % IV SOLN
150.0000 mg | Freq: Once | INTRAVENOUS | Status: AC
  Administered 2020-08-23: 150 mg via INTRAVENOUS
  Filled 2020-08-23: qty 150

## 2020-08-23 MED ORDER — ATROPINE SULFATE 1 MG/ML IJ SOLN
INTRAMUSCULAR | Status: AC
  Filled 2020-08-23: qty 1

## 2020-08-23 MED ORDER — ATROPINE SULFATE 1 MG/ML IJ SOLN
0.5000 mg | Freq: Once | INTRAMUSCULAR | Status: AC | PRN
  Administered 2020-08-23: 0.5 mg via INTRAVENOUS

## 2020-08-23 MED ORDER — OXALIPLATIN CHEMO INJECTION 100 MG/20ML
68.0000 mg/m2 | Freq: Once | INTRAVENOUS | Status: AC
  Administered 2020-08-23: 120 mg via INTRAVENOUS
  Filled 2020-08-23: qty 20

## 2020-08-23 MED ORDER — SODIUM CHLORIDE 0.9 % IV SOLN
1920.0000 mg/m2 | INTRAVENOUS | Status: DC
  Administered 2020-08-23: 3400 mg via INTRAVENOUS
  Filled 2020-08-23: qty 68

## 2020-08-23 MED ORDER — PALONOSETRON HCL INJECTION 0.25 MG/5ML
0.2500 mg | Freq: Once | INTRAVENOUS | Status: AC
  Administered 2020-08-23: 0.25 mg via INTRAVENOUS

## 2020-08-23 MED ORDER — SODIUM CHLORIDE 0.9 % IV SOLN
10.0000 mg | Freq: Once | INTRAVENOUS | Status: AC
  Administered 2020-08-23: 10 mg via INTRAVENOUS
  Filled 2020-08-23: qty 10

## 2020-08-23 MED ORDER — OXYCODONE HCL 5 MG PO TABS: 5.0000 mg | ORAL_TABLET | ORAL | 0 refills | Status: DC | PRN

## 2020-08-23 MED ORDER — PALONOSETRON HCL INJECTION 0.25 MG/5ML
INTRAVENOUS | Status: AC
  Filled 2020-08-23: qty 5

## 2020-08-23 MED ORDER — DEXTROSE 5 % IV SOLN
Freq: Once | INTRAVENOUS | Status: AC
  Filled 2020-08-23: qty 250

## 2020-08-23 MED ORDER — SODIUM CHLORIDE 0.9 % IV SOLN
395.0000 mg/m2 | Freq: Once | INTRAVENOUS | Status: AC
  Administered 2020-08-23: 700 mg via INTRAVENOUS
  Filled 2020-08-23: qty 35

## 2020-08-23 MED ORDER — DEXTROSE 5 % IV SOLN
Freq: Once | INTRAVENOUS | Status: AC
Start: 2020-08-23 — End: 2020-08-23
  Filled 2020-08-23: qty 250

## 2020-08-23 NOTE — Telephone Encounter (Signed)
appts made per 1-19 los and pt will rec sch in tx/avs       Webb Silversmith

## 2020-08-23 NOTE — Patient Instructions (Signed)

## 2020-08-23 NOTE — Progress Notes (Signed)
Patient doing well today. She is still experiencing low abdominal/pelvic pain but she feels like it's been less frequent and more tolerable. She does have a call into her GI MD to see if there is anything they can recommend. She knows to call our office with any questions or concerns.  Oncology Nurse Navigator Documentation  Oncology Nurse Navigator Flowsheets 08/23/2020  Confirmed Diagnosis Date -  Diagnosis Status -  Phase of Treatment -  Chemotherapy Pending- Reason: -  Chemotherapy Actual Start Date: -  Navigator Follow Up Date: 09/06/2020  Navigator Follow Up Reason: Follow-up Appointment;Chemotherapy  Production assistant, radio Encounter Type Treatment  Telephone -  Treatment Initiated Date -  Patient Visit Type MedOnc  Treatment Phase Active Tx  Barriers/Navigation Needs Coordination of Care;Education  Education Other  Interventions Education;Psycho-Social Support  Acuity Level 2-Minimal Needs (1-2 Barriers Identified)  Coordination of Care -  Education Method Verbal  Support Groups/Services Friends and Family  Time Spent with Patient 30

## 2020-08-23 NOTE — Progress Notes (Signed)
Hematology and Oncology Follow Up Visit  Tamara Powell [id number] [birth year] 75 y.o. 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 She had her first cycle of chemotherapy and did pretty well. No one probably she has has been of lower pelvic pain because of diverticulitis. She has had diverticulitis in the past. We did go ahead and put her on some Flagyl. This really did not help the pain that much. She did take some oxycodone which did help. As such, we will send some oxycodone in for her. Hopefully this will make a difference.  She really had no nausea. There is no mouth sores. She has had no fever. There is no bleeding. She had no leg swelling.  Again, she does have CLL. This has not been an issue.  She has had no cough or shortness of breath. She has had no headache.  Overall, performance status is ECOG 1.  Medications:  Current Outpatient Medications:  .  b complex vitamins capsule, Take 1 capsule by mouth daily., Disp: , Rfl:  .  calcium carbonate (OS-CAL) 600 MG TABS, Take 600 mg by mouth 2 (two) times daily with a meal., Disp: , Rfl:  .  dexamethasone (DECADRON) 4 MG tablet, Take 2 tablets (8 mg total) by mouth daily. Start the day after chemotherapy for 3 days. Take with food., Disp: 8 tablet, Rfl: 5 .  metroNIDAZOLE (FLAGYL) 500 MG tablet, Take 1 tablet (500 mg total) by mouth 3 (three) times daily for 7 days., Disp: 21 tablet, Rfl: 0 .  Multiple Vitamin (MULTIVITAMIN) tablet, Take 1 tablet by mouth daily., Disp: , Rfl:  .  ondansetron (ZOFRAN) 8 MG tablet, Take 1 tablet (8 mg total) by mouth 2 (two) times daily as needed. Start on day 3 after chemotherapy., Disp: 30 tablet, Rfl: 1 .  oxyCODONE-acetaminophen (PERCOCET/ROXICET) 5-325 MG tablet,  Take by mouth every 6 (six) hours as needed for severe pain., Disp: , Rfl:  .  prochlorperazine (COMPAZINE) 10 MG tablet, Take 1 tablet (10 mg total) by mouth every 6 (six) hours as needed (Nausea or vomiting)., Disp: 30 tablet, Rfl: 1 .  Cholecalciferol 25 MCG (1000 UT) tablet, 1,000 Units daily. (Patient not taking: Reported on 08/23/2020), Disp: , Rfl:  .  co-enzyme Q-10 30 MG capsule, Take 100 mg by mouth 2 (two) times daily. (Patient not taking: Reported on 08/23/2020), Disp: , Rfl:  .  COLLAGEN PO, Take by mouth. One scoop of powder daily in coffee. (Patient not taking: Reported on 08/23/2020), Disp: , Rfl:  .  fish oil-omega-3 fatty acids 1000 MG capsule, Take 1 g by mouth 2 (two) times daily. (Patient not taking: Reported on 08/23/2020), Disp: , Rfl:  .  Green Tea, Camellia sinensis, (GREEN TEA EXTRACT PO), Take by mouth. Takes 3 caps daily. (Patient not taking: Reported on 08/23/2020), Disp: , Rfl:  .  lidocaine-prilocaine (EMLA) cream, Apply to affected area once (Patient not taking: No sig reported), Disp: 30 g, Rfl: 3 .  loperamide (IMODIUM A-D) 2 MG tablet, Take 2 at onset of diarrhea, then 1 every 2hrs until 12hr without a BM. May take 2 tab every 4hrs at bedtime. If diarrhea recurs repeat. (Patient not taking: No sig reported), Disp: 100 tablet, Rfl: 1 .  Nutritional Supplements (GRAPESEED EXTRACT) 500-50 MG CAPS, Take by mouth 2 (two) times daily. (Patient not taking: Reported on 08/23/2020), Disp: , Rfl:  .  OVER THE COUNTER MEDICATION, Take by mouth 2 (two) times daily. WELLNESS FORMULA CAPSULE--HAIR SKIN NAILS CAPSULES. (Patient not taking: Reported on 08/23/2020), Disp: , Rfl:  .  OVER THE COUNTER MEDICATION, Green Vibrance daily. (Patient not taking: Reported on 08/23/2020), Disp: , Rfl:  .  Turmeric 450 MG CAPS, Take by mouth every morning. (Patient not taking: Reported on 08/23/2020), Disp: , Rfl:  .  vitamin E 180 MG (400 UNITS) capsule, 400 Units daily. (Patient not taking: Reported on  08/23/2020), Disp: , Rfl:   Allergies:  Allergies  Allergen Reactions  . Alendronate Swelling  . Ibandronic Acid Swelling  . Latex Hives    Past Medical History, Surgical history, Social history, and Family History were reviewed and updated.  Review of Systems: Review of Systems  Constitutional: Positive for unexpected weight change.  HENT:  Negative.   Eyes: Negative.   Respiratory: Negative.   Gastrointestinal: Positive for abdominal pain and diarrhea. Negative for abdominal distention.  Endocrine: Negative.   Genitourinary: Negative.    Musculoskeletal: Negative.   Skin: Negative.   Neurological: Negative.   Hematological: Negative.   Psychiatric/Behavioral: Negative.     Physical Exam:  weight is 140 lb 1.9 oz (63.6 kg). Her oral temperature is 97.6 F (36.4 C). Her blood pressure is 120/53 (abnormal) and her pulse is 91. Her respiration is 17 and oxygen saturation is 100%.   Wt Readings from Last 3 Encounters:  08/23/20 140 lb 1.9 oz (63.6 kg)  08/07/20 145 lb (65.8 kg)  07/25/20 145 lb (65.8 kg)    Physical Exam Vitals reviewed.  HENT:     Head: Normocephalic and atraumatic.  Eyes:     Pupils: Pupils are equal, round, and reactive to light.  Cardiovascular:     Rate and Rhythm: Normal rate and regular rhythm.     Heart sounds: Normal heart sounds.  Pulmonary:     Effort: Pulmonary effort is normal.     Breath sounds: Normal breath sounds.  Abdominal:     General: Bowel sounds are normal.     Palpations: Abdomen is soft.  Musculoskeletal:        General: No tenderness or deformity. Normal range of motion.     Cervical back: Normal range of motion.  Lymphadenopathy:     Cervical: No cervical adenopathy.  Skin:    General: Skin is warm and dry.     Findings: No erythema or rash.  Neurological:     Mental Status: She is alert and oriented to person, place, and time.  Psychiatric:        Behavior: Behavior normal.        Thought Content: Thought  content normal.        Judgment: Judgment normal.      Lab Results  Component Value Date   WBC 19.1 (H) 08/23/2020   HGB 11.3 (L) 08/23/2020   HCT 33.4 (L) 08/23/2020   MCV 93.3 08/23/2020   PLT 223 08/23/2020     Chemistry      Component Value Date/Time   NA 137 08/23/2020 0800   NA 139 11/11/2016 1426   NA 142 08/30/2015 0941   K 4.0 08/23/2020 0800   K 4.1 11/11/2016 1426   K 4.9 08/30/2015 0941   CL 102 08/23/2020 0800   CL 103 11/11/2016 1426   CO2 27  08/23/2020 0800   CO2 28 11/11/2016 1426   CO2 27 08/30/2015 0941   BUN 13 08/23/2020 0800   BUN 12 11/11/2016 1426   BUN 11.8 08/30/2015 0941   CREATININE 0.76 08/23/2020 0800   CREATININE 0.9 11/11/2016 1426   CREATININE 1.0 08/30/2015 0941      Component Value Date/Time   CALCIUM 9.0 08/23/2020 0800   CALCIUM 9.4 11/11/2016 1426   CALCIUM 9.5 08/30/2015 0941   ALKPHOS 84 08/23/2020 0800   ALKPHOS 65 11/11/2016 1426   ALKPHOS 62 08/30/2015 0941   AST 19 08/23/2020 0800   AST 19 08/30/2015 0941   ALT 16 08/23/2020 0800   ALT 20 11/11/2016 1426   ALT 13 08/30/2015 0941   BILITOT 0.5 08/23/2020 0800   BILITOT 1.25 (H) 08/30/2015 0941      Impression and Plan: Ms. Sailer is a very charming 75 year old white female with a long history of CLL. Now, she has stage IV pancreatic adenocarcinoma.  She is status post 1 cycle of FOLFIRINOX. I think she is done well with this. Her white cell count is little bit on the lower side with respect to the neutrophils but her monocytes are up so I think that the white cell count with respect to neutrophils is on the upper side.  We will have to see what the CA 19-9 is. I would like to think that this is going to be less.  There are no actionable mutations. 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 I am unsure exactly what this would indicate with respect to response to treatment.  We will go ahead and plan to get her back in another couple weeks. Again we will have  to watch the white cell count closely. We may have to think about Neulasta for her depending on what we find with the next set of blood counts.    [name], MD 1/19/20228:51 AM

## 2020-08-23 NOTE — Patient Instructions (Signed)
[facility] Discharge Instructions for Patients Receiving Chemotherapy  Today you received the following chemotherapy agents  :Oxaliplatin, Leukovorin, 5FU  To help prevent nausea and vomiting after your treatment, we encourage you to take your nausea medication  As directed   If you develop nausea and vomiting that is not controlled by your nausea medication, call the clinic.   BELOW ARE SYMPTOMS THAT SHOULD BE REPORTED IMMEDIATELY:  *FEVER GREATER THAN 100.5 F  *CHILLS WITH OR WITHOUT FEVER  NAUSEA AND VOMITING THAT IS NOT CONTROLLED WITH YOUR NAUSEA MEDICATION  *UNUSUAL SHORTNESS OF BREATH  *UNUSUAL BRUISING OR BLEEDING  TENDERNESS IN MOUTH AND THROAT WITH OR WITHOUT PRESENCE OF ULCERS  *URINARY PROBLEMS  *BOWEL PROBLEMS  UNUSUAL RASH Items with * indicate a potential emergency and should be followed up as soon as possible.  Feel free to call the clinic should you have any questions or concerns. The clinic phone number is [phone].  Please show the [facility] at check-in to the Emergency Department and triage nurse.

## 2020-08-23 NOTE — Addendum Note (Signed)
Addended by: Burney Gauze R on: 08/23/2020 09:44 AM   Modules accepted: Orders

## 2020-08-23 NOTE — Progress Notes (Signed)
Ok to treat with anc of 1.0 per Dr Marin Olp. dph

## 2020-08-24 LAB — CEA (IN HOUSE-CHCC): CEA (CHCC-In House): 191.05 ng/mL — ABNORMAL HIGH (ref 0.00–5.00)

## 2020-08-24 LAB — CANCER ANTIGEN 19-9: CA 19-9: 13031 U/mL — ABNORMAL HIGH (ref 0–35)

## 2020-08-25 ENCOUNTER — Inpatient Hospital Stay: Payer: Medicare Other

## 2020-08-25 ENCOUNTER — Other Ambulatory Visit: Payer: Self-pay

## 2020-08-25 VITALS — BP 122/58 | HR 78 | Temp 98.3°F | Resp 17

## 2020-08-25 DIAGNOSIS — C259 Malignant neoplasm of pancreas, unspecified: Secondary | ICD-10-CM | POA: Diagnosis not present

## 2020-08-25 DIAGNOSIS — Z79899 Other long term (current) drug therapy: Secondary | ICD-10-CM | POA: Diagnosis not present

## 2020-08-25 DIAGNOSIS — Z5111 Encounter for antineoplastic chemotherapy: Secondary | ICD-10-CM | POA: Diagnosis not present

## 2020-08-25 DIAGNOSIS — C787 Secondary malignant neoplasm of liver and intrahepatic bile duct: Secondary | ICD-10-CM | POA: Diagnosis not present

## 2020-08-25 DIAGNOSIS — C9110 Chronic lymphocytic leukemia of B-cell type not having achieved remission: Secondary | ICD-10-CM | POA: Diagnosis not present

## 2020-08-25 DIAGNOSIS — C7800 Secondary malignant neoplasm of unspecified lung: Secondary | ICD-10-CM | POA: Diagnosis not present

## 2020-08-25 MED ORDER — SODIUM CHLORIDE 0.9% FLUSH
10.0000 mL | INTRAVENOUS | Status: DC | PRN
  Administered 2020-08-25: 10 mL
  Filled 2020-08-25: qty 10

## 2020-08-25 MED ORDER — HEPARIN SOD (PORK) LOCK FLUSH 100 UNIT/ML IV SOLN
500.0000 [IU] | Freq: Once | INTRAVENOUS | Status: AC | PRN
  Administered 2020-08-25: 500 [IU]
  Filled 2020-08-25: qty 5

## 2020-08-25 NOTE — Patient Instructions (Signed)
Fluorouracil, 5-FU injection What is this medicine? FLUOROURACIL, 5-FU (flure oh YOOR a sil) is a chemotherapy drug. It slows the growth of cancer cells. This medicine is used to treat many types of cancer like breast cancer, colon or rectal cancer, pancreatic cancer, and stomach cancer. This medicine may be used for other purposes; ask your health care provider or pharmacist if you have questions. COMMON BRAND NAME(S): Adrucil What should I tell my health care provider before I take this medicine? They need to know if you have any of these conditions:  blood disorders  dihydropyrimidine dehydrogenase (DPD) deficiency  infection (especially a virus infection such as chickenpox, cold sores, or herpes)  kidney disease  liver disease  malnourished, poor nutrition  recent or ongoing radiation therapy  an unusual or allergic reaction to fluorouracil, other chemotherapy, other medicines, foods, dyes, or preservatives  pregnant or trying to get pregnant  breast-feeding How should I use this medicine? This drug is given as an infusion or injection into a vein. It is administered in a hospital or clinic by a specially trained health care professional. Talk to your pediatrician regarding the use of this medicine in children. Special care may be needed. Overdosage: If you think you have taken too much of this medicine contact a poison control center or emergency room at once. NOTE: This medicine is only for you. Do not share this medicine with others. What if I miss a dose? It is important not to miss your dose. Call your doctor or health care professional if you are unable to keep an appointment. What may interact with this medicine? Do not take this medicine with any of the following medications:  live virus vaccines This medicine may also interact with the following medications:  medicines that treat or prevent blood clots like warfarin, enoxaparin, and dalteparin This list may not  describe all possible interactions. Give your health care provider a list of all the medicines, herbs, non-prescription drugs, or dietary supplements you use. Also tell them if you smoke, drink alcohol, or use illegal drugs. Some items may interact with your medicine. What should I watch for while using this medicine? Visit your doctor for checks on your progress. This drug may make you feel generally unwell. This is not uncommon, as chemotherapy can affect healthy cells as well as cancer cells. Report any side effects. Continue your course of treatment even though you feel ill unless your doctor tells you to stop. In some cases, you may be given additional medicines to help with side effects. Follow all directions for their use. Call your doctor or health care professional for advice if you get a fever, chills or sore throat, or other symptoms of a cold or flu. Do not treat yourself. This drug decreases your body's ability to fight infections. Try to avoid being around people who are sick. This medicine may increase your risk to bruise or bleed. Call your doctor or health care professional if you notice any unusual bleeding. Be careful brushing and flossing your teeth or using a toothpick because you may get an infection or bleed more easily. If you have any dental work done, tell your dentist you are receiving this medicine. Avoid taking products that contain aspirin, acetaminophen, ibuprofen, naproxen, or ketoprofen unless instructed by your doctor. These medicines may hide a fever. Do not become pregnant while taking this medicine. Women should inform their doctor if they wish to become pregnant or think they might be pregnant. There is a potential   for serious side effects to an unborn child. Talk to your health care professional or pharmacist for more information. Do not breast-feed an infant while taking this medicine. Men should inform their doctor if they wish to father a child. This medicine may  lower sperm counts. Do not treat diarrhea with over the counter products. Contact your doctor if you have diarrhea that lasts more than 2 days or if it is severe and watery. This medicine can make you more sensitive to the sun. Keep out of the sun. If you cannot avoid being in the sun, wear protective clothing and use sunscreen. Do not use sun lamps or tanning beds/booths. What side effects may I notice from receiving this medicine? Side effects that you should report to your doctor or health care professional as soon as possible:  allergic reactions like skin rash, itching or hives, swelling of the face, lips, or tongue  low blood counts - this medicine may decrease the number of white blood cells, red blood cells and platelets. You may be at increased risk for infections and bleeding.  signs of infection - fever or chills, cough, sore throat, pain or difficulty passing urine  signs of decreased platelets or bleeding - bruising, pinpoint red spots on the skin, black, tarry stools, blood in the urine  signs of decreased red blood cells - unusually weak or tired, fainting spells, lightheadedness  breathing problems  changes in vision  chest pain  mouth sores  nausea and vomiting  pain, swelling, redness at site where injected  pain, tingling, numbness in the hands or feet  redness, swelling, or sores on hands or feet  stomach pain  unusual bleeding Side effects that usually do not require medical attention (report to your doctor or health care professional if they continue or are bothersome):  changes in finger or toe nails  diarrhea  dry or itchy skin  hair loss  headache  loss of appetite  sensitivity of eyes to the light  stomach upset  unusually teary eyes This list may not describe all possible side effects. Call your doctor for medical advice about side effects. You may report side effects to FDA at 1-800-FDA-1088. Where should I keep my medicine? This  drug is given in a hospital or clinic and will not be stored at home. NOTE: This sheet is a summary. It may not cover all possible information. If you have questions about this medicine, talk to your doctor, pharmacist, or health care provider.  2021 Elsevier/Gold Standard (2019-06-22 15:00:03)

## 2020-09-04 DIAGNOSIS — K5790 Diverticulosis of intestine, part unspecified, without perforation or abscess without bleeding: Secondary | ICD-10-CM | POA: Diagnosis not present

## 2020-09-04 DIAGNOSIS — K5900 Constipation, unspecified: Secondary | ICD-10-CM | POA: Diagnosis not present

## 2020-09-04 DIAGNOSIS — K869 Disease of pancreas, unspecified: Secondary | ICD-10-CM | POA: Diagnosis not present

## 2020-09-06 ENCOUNTER — Inpatient Hospital Stay: Payer: Medicare Other

## 2020-09-06 ENCOUNTER — Encounter: Payer: Self-pay | Admitting: Hematology & Oncology

## 2020-09-06 ENCOUNTER — Inpatient Hospital Stay: Payer: Medicare Other | Attending: Hematology & Oncology

## 2020-09-06 ENCOUNTER — Inpatient Hospital Stay (HOSPITAL_BASED_OUTPATIENT_CLINIC_OR_DEPARTMENT_OTHER): Payer: Medicare Other | Admitting: Hematology & Oncology

## 2020-09-06 ENCOUNTER — Encounter: Payer: Self-pay | Admitting: *Deleted

## 2020-09-06 ENCOUNTER — Telehealth: Payer: Self-pay | Admitting: *Deleted

## 2020-09-06 ENCOUNTER — Other Ambulatory Visit: Payer: Self-pay

## 2020-09-06 VITALS — BP 124/50 | HR 89 | Temp 98.1°F | Resp 18 | Wt 142.0 lb

## 2020-09-06 DIAGNOSIS — C9110 Chronic lymphocytic leukemia of B-cell type not having achieved remission: Secondary | ICD-10-CM | POA: Diagnosis not present

## 2020-09-06 DIAGNOSIS — Z701 Counseling related to patient's sexual behavior and orientation: Secondary | ICD-10-CM | POA: Diagnosis not present

## 2020-09-06 DIAGNOSIS — C259 Malignant neoplasm of pancreas, unspecified: Secondary | ICD-10-CM

## 2020-09-06 DIAGNOSIS — R112 Nausea with vomiting, unspecified: Secondary | ICD-10-CM | POA: Diagnosis not present

## 2020-09-06 DIAGNOSIS — C7800 Secondary malignant neoplasm of unspecified lung: Secondary | ICD-10-CM | POA: Insufficient documentation

## 2020-09-06 DIAGNOSIS — C787 Secondary malignant neoplasm of liver and intrahepatic bile duct: Secondary | ICD-10-CM

## 2020-09-06 DIAGNOSIS — Z79899 Other long term (current) drug therapy: Secondary | ICD-10-CM | POA: Insufficient documentation

## 2020-09-06 DIAGNOSIS — Z5111 Encounter for antineoplastic chemotherapy: Secondary | ICD-10-CM | POA: Insufficient documentation

## 2020-09-06 DIAGNOSIS — Z5189 Encounter for other specified aftercare: Secondary | ICD-10-CM | POA: Insufficient documentation

## 2020-09-06 LAB — CMP (CANCER CENTER ONLY)
ALT: 19 U/L (ref 0–44)
AST: 17 U/L (ref 15–41)
Albumin: 3.8 g/dL (ref 3.5–5.0)
Alkaline Phosphatase: 102 U/L (ref 38–126)
Anion gap: 8 (ref 5–15)
BUN: 12 mg/dL (ref 8–23)
CO2: 27 mmol/L (ref 22–32)
Calcium: 9 mg/dL (ref 8.9–10.3)
Chloride: 103 mmol/L (ref 98–111)
Creatinine: 0.66 mg/dL (ref 0.44–1.00)
GFR, Estimated: >60 mL/min (ref >60)
Glucose, Bld: 109 mg/dL — ABNORMAL HIGH (ref 70–99)
Potassium: 3.4 mmol/L — ABNORMAL LOW (ref 3.5–5.1)
Sodium: 138 mmol/L (ref 135–145)
Total Bilirubin: 0.5 mg/dL (ref 0.3–1.2)
Total Protein: 5.8 g/dL — ABNORMAL LOW (ref 6.5–8.1)

## 2020-09-06 LAB — CBC WITH DIFFERENTIAL (CANCER CENTER ONLY)
Abs Immature Granulocytes: 0.01 10*3/uL (ref 0.00–0.07)
Basophils Absolute: 0 10*3/uL (ref 0.0–0.1)
Basophils Relative: 0 %
Eosinophils Absolute: 0.1 10*3/uL (ref 0.0–0.5)
Eosinophils Relative: 1 %
HCT: 30.8 % — ABNORMAL LOW (ref 36.0–46.0)
Hemoglobin: 10.5 g/dL — ABNORMAL LOW (ref 12.0–15.0)
Immature Granulocytes: 0 %
Lymphocytes Relative: 94 %
Lymphs Abs: 12.6 10*3/uL — ABNORMAL HIGH (ref 0.7–4.0)
MCH: 31.1 pg (ref 26.0–34.0)
MCHC: 34.1 g/dL (ref 30.0–36.0)
MCV: 91.1 fL (ref 80.0–100.0)
Monocytes Absolute: 0.1 10*3/uL (ref 0.1–1.0)
Monocytes Relative: 1 %
Neutro Abs: 0.6 10*3/uL — ABNORMAL LOW (ref 1.7–7.7)
Neutrophils Relative %: 4 %
Platelet Count: 166 10*3/uL (ref 150–400)
RBC: 3.38 MIL/uL — ABNORMAL LOW (ref 3.87–5.11)
RDW: 14.3 % (ref 11.5–15.5)
WBC Count: 13.4 10*3/uL — ABNORMAL HIGH (ref 4.0–10.5)
nRBC: 0 % (ref 0.0–0.2)

## 2020-09-06 LAB — LACTATE DEHYDROGENASE: LDH: 95 U/L — ABNORMAL LOW (ref 98–192)

## 2020-09-06 MED ORDER — SODIUM CHLORIDE 0.9% FLUSH
10.0000 mL | INTRAVENOUS | Status: DC | PRN
  Filled 2020-09-06: qty 10

## 2020-09-06 MED ORDER — SODIUM CHLORIDE 0.9 % IV SOLN
396.0000 mg/m2 | Freq: Once | INTRAVENOUS | Status: AC
  Administered 2020-09-06: 700 mg via INTRAVENOUS
  Filled 2020-09-06: qty 35

## 2020-09-06 MED ORDER — SODIUM CHLORIDE 0.9 % IV SOLN
10.0000 mg | Freq: Once | INTRAVENOUS | Status: AC
  Administered 2020-09-06: 10 mg via INTRAVENOUS
  Filled 2020-09-06: qty 10

## 2020-09-06 MED ORDER — DEXTROSE 5 % IV SOLN
Freq: Once | INTRAVENOUS | Status: AC
  Filled 2020-09-06: qty 250

## 2020-09-06 MED ORDER — ONDANSETRON HCL 8 MG PO TABS
ORAL_TABLET | ORAL | Status: AC
  Filled 2020-09-06: qty 1

## 2020-09-06 MED ORDER — SODIUM CHLORIDE 0.9 % IV SOLN
120.0000 mg/m2 | Freq: Once | INTRAVENOUS | Status: AC
  Administered 2020-09-06: 220 mg via INTRAVENOUS
  Filled 2020-09-06: qty 5

## 2020-09-06 MED ORDER — PALONOSETRON HCL INJECTION 0.25 MG/5ML
INTRAVENOUS | Status: AC
  Filled 2020-09-06: qty 5

## 2020-09-06 MED ORDER — PALONOSETRON HCL INJECTION 0.25 MG/5ML
0.2500 mg | Freq: Once | INTRAVENOUS | Status: AC
  Administered 2020-09-06: 0.25 mg via INTRAVENOUS

## 2020-09-06 MED ORDER — ATROPINE SULFATE 1 MG/ML IJ SOLN
INTRAMUSCULAR | Status: AC
  Filled 2020-09-06: qty 1

## 2020-09-06 MED ORDER — SODIUM CHLORIDE 0.9 % IV SOLN
150.0000 mg | Freq: Once | INTRAVENOUS | Status: AC
  Administered 2020-09-06: 150 mg via INTRAVENOUS
  Filled 2020-09-06: qty 5

## 2020-09-06 MED ORDER — ATROPINE SULFATE 1 MG/ML IJ SOLN
0.5000 mg | Freq: Once | INTRAMUSCULAR | Status: AC | PRN
  Administered 2020-09-06: 0.5 mg via INTRAVENOUS

## 2020-09-06 MED ORDER — OXALIPLATIN CHEMO INJECTION 100 MG/20ML
68.0000 mg/m2 | Freq: Once | INTRAVENOUS | Status: AC
  Administered 2020-09-06: 120 mg via INTRAVENOUS
  Filled 2020-09-06: qty 20

## 2020-09-06 MED ORDER — HEPARIN SOD (PORK) LOCK FLUSH 100 UNIT/ML IV SOLN
500.0000 [IU] | Freq: Once | INTRAVENOUS | Status: DC | PRN
  Filled 2020-09-06: qty 5

## 2020-09-06 MED ORDER — FLUOROURACIL CHEMO INJECTION 5 GM/100ML
1920.0000 mg/m2 | INTRAVENOUS | Status: DC
  Administered 2020-09-06: 3400 mg via INTRAVENOUS
  Filled 2020-09-06: qty 68

## 2020-09-06 NOTE — Progress Notes (Signed)
Reviewed labwork with Dr Marin Olp.  Aware of Neutrophils .6.  Ok to treat today.

## 2020-09-06 NOTE — Telephone Encounter (Signed)
Per los 09/06/20 added injection apt.

## 2020-09-06 NOTE — Progress Notes (Signed)
Hematology and Oncology Follow Up Visit  Tamara Powell [id number] 1946/02/07 75 y.o. 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  She seemed to tolerate her second cycle of chemotherapy better.  We made some dosage adjustments.  I am a bit bothered by the fact that her CLL which she has had chronically is not at all affected by the chemotherapy.  I would have thought that her CLL would have gotten better.  She is neutropenic.  Are probably going to have to put her on Neulasta after treatment to try to help get her white cell count is up a little bit better.  I think she is her gastroenterologist about this abdominal pain.  He is working her up for this.  Again it might be malignancy that might be causing this.  She is little bit constipated.  He did give her a bowel regimen.  Unfortunately, her last CA 19-9 was up a little bit.  I am a little surprised by this.  The CA 19-9 was 13,000.  The CEA was 191.  Hopefully, this will be lower.  She seems to be eating a little bit better.  She is having no mouth sores.  There is no rashes.  She has had no leg swelling.  She does have scoliosis of the spine.  Currently, her performance status is ECOG 1.   Medications:  Current Outpatient Medications:  .  b complex vitamins capsule, Take 1 capsule by mouth daily., Disp: , Rfl:  .  calcium carbonate (OS-CAL) 600 MG TABS, Take 600 mg by mouth 2 (two) times daily with a meal., Disp: , Rfl:  .  Cholecalciferol 25 MCG (1000 UT) tablet, 1,000 Units daily. (Patient not taking: Reported on 08/23/2020), Disp: , Rfl:  .  co-enzyme Q-10 30 MG capsule, Take 100 mg by mouth 2 (two) times daily. (Patient not taking: Reported on 08/23/2020), Disp: , Rfl:  .  COLLAGEN PO, Take by  mouth. One scoop of powder daily in coffee. (Patient not taking: Reported on 08/23/2020), Disp: , Rfl:  .  dexamethasone (DECADRON) 4 MG tablet, Take 2 tablets (8 mg total) by mouth daily. Start the day after chemotherapy for 3 days. Take with food., Disp: 8 tablet, Rfl: 5 .  dicyclomine (BENTYL) 10 MG capsule, Take 10 mg by mouth every 6 (six) hours as needed., Disp: , Rfl:  .  fish oil-omega-3 fatty acids 1000 MG capsule, Take 1 g by mouth 2 (two) times daily. (Patient not taking: Reported on 08/23/2020), Disp: , Rfl:  .  Green Tea, Camellia sinensis, (GREEN TEA EXTRACT PO), Take by mouth. Takes 3 caps daily. (Patient not taking: Reported on 08/23/2020), Disp: , Rfl:  .  lidocaine-prilocaine (EMLA) cream, Apply to affected area once (Patient not taking: No sig reported), Disp: 30 g, Rfl: 3 .  loperamide (IMODIUM A-D) 2 MG tablet, Take 2 at onset of diarrhea, then 1 every 2hrs until 12hr without a BM. May take 2 tab every 4hrs at bedtime. If diarrhea recurs repeat. (Patient not taking: No sig reported), Disp: 100 tablet, Rfl: 1 .  Multiple Vitamin (MULTIVITAMIN) tablet, Take 1 tablet by mouth daily., Disp: , Rfl:  .  Nutritional Supplements (GRAPESEED EXTRACT) 500-50 MG CAPS, Take by  mouth 2 (two) times daily. (Patient not taking: Reported on 08/23/2020), Disp: , Rfl:  .  ondansetron (ZOFRAN) 8 MG tablet, Take 1 tablet (8 mg total) by mouth 2 (two) times daily as needed. Start on day 3 after chemotherapy., Disp: 30 tablet, Rfl: 1 .  OVER THE COUNTER MEDICATION, Take by mouth 2 (two) times daily. WELLNESS FORMULA CAPSULE--HAIR SKIN NAILS CAPSULES. (Patient not taking: Reported on 08/23/2020), Disp: , Rfl:  .  OVER THE COUNTER MEDICATION, Green Vibrance daily. (Patient not taking: Reported on 08/23/2020), Disp: , Rfl:  .  oxyCODONE (OXY IR/ROXICODONE) 5 MG immediate release tablet, Take 1 tablet (5 mg total) by mouth every 4 (four) hours as needed for severe pain., Disp: 30 tablet, Rfl: 0 .   oxyCODONE-acetaminophen (PERCOCET/ROXICET) 5-325 MG tablet, Take by mouth every 6 (six) hours as needed for severe pain., Disp: , Rfl:  .  prochlorperazine (COMPAZINE) 10 MG tablet, Take 1 tablet (10 mg total) by mouth every 6 (six) hours as needed (Nausea or vomiting)., Disp: 30 tablet, Rfl: 1 .  Turmeric 450 MG CAPS, Take by mouth every morning. (Patient not taking: Reported on 08/23/2020), Disp: , Rfl:  .  vitamin E 180 MG (400 UNITS) capsule, 400 Units daily. (Patient not taking: Reported on 08/23/2020), Disp: , Rfl:   Allergies:  Allergies  Allergen Reactions  . Alendronate Swelling  . Ibandronic Acid Swelling  . Latex Hives    Past Medical History, Surgical history, Social history, and Family History were reviewed and updated.  Review of Systems: Review of Systems  Constitutional: Positive for unexpected weight change.  HENT:  Negative.   Eyes: Negative.   Respiratory: Negative.   Gastrointestinal: Positive for abdominal pain and diarrhea. Negative for abdominal distention.  Endocrine: Negative.   Genitourinary: Negative.    Musculoskeletal: Negative.   Skin: Negative.   Neurological: Negative.   Hematological: Negative.   Psychiatric/Behavioral: Negative.     Physical Exam:  weight is 142 lb (64.4 kg). Her oral temperature is 98.1 F (36.7 C). Her blood pressure is 124/50 (abnormal) and her pulse is 89. Her respiration is 18 and oxygen saturation is 100%.   Wt Readings from Last 3 Encounters:  09/06/20 142 lb (64.4 kg)  08/23/20 140 lb 1.9 oz (63.6 kg)  08/07/20 145 lb (65.8 kg)    Physical Exam Vitals reviewed.  HENT:     Head: Normocephalic and atraumatic.  Eyes:     Pupils: Pupils are equal, round, and reactive to light.  Cardiovascular:     Rate and Rhythm: Normal rate and regular rhythm.     Heart sounds: Normal heart sounds.  Pulmonary:     Effort: Pulmonary effort is normal.     Breath sounds: Normal breath sounds.  Abdominal:     General: Bowel  sounds are normal.     Palpations: Abdomen is soft.  Musculoskeletal:        General: No tenderness or deformity. Normal range of motion.     Cervical back: Normal range of motion.  Lymphadenopathy:     Cervical: No cervical adenopathy.  Skin:    General: Skin is warm and dry.     Findings: No erythema or rash.  Neurological:     Mental Status: She is alert and oriented to person, place, and time.  Psychiatric:        Behavior: Behavior normal.        Thought Content: Thought content normal.        Judgment: Judgment  normal.      Lab Results  Component Value Date   WBC 13.4 (H) 09/06/2020   HGB 10.5 (L) 09/06/2020   HCT 30.8 (L) 09/06/2020   MCV 91.1 09/06/2020   PLT 166 09/06/2020     Chemistry      Component Value Date/Time   NA 138 09/06/2020 1013   NA 139 11/11/2016 1426   NA 142 08/30/2015 0941   K 3.4 (L) 09/06/2020 1013   K 4.1 11/11/2016 1426   K 4.9 08/30/2015 0941   CL 103 09/06/2020 1013   CL 103 11/11/2016 1426   CO2 27 09/06/2020 1013   CO2 28 11/11/2016 1426   CO2 27 08/30/2015 0941   BUN 12 09/06/2020 1013   BUN 12 11/11/2016 1426   BUN 11.8 08/30/2015 0941   CREATININE 0.66 09/06/2020 1013   CREATININE 0.9 11/11/2016 1426   CREATININE 1.0 08/30/2015 0941      Component Value Date/Time   CALCIUM 9.0 09/06/2020 1013   CALCIUM 9.4 11/11/2016 1426   CALCIUM 9.5 08/30/2015 0941   ALKPHOS 102 09/06/2020 1013   ALKPHOS 65 11/11/2016 1426   ALKPHOS 62 08/30/2015 0941   AST 17 09/06/2020 1013   AST 19 08/30/2015 0941   ALT 19 09/06/2020 1013   ALT 20 11/11/2016 1426   ALT 13 08/30/2015 0941   BILITOT 0.5 09/06/2020 1013   BILITOT 1.25 (H) 08/30/2015 0941      Impression and Plan: Ms. Harr is a very charming 75 year old white female with a long history of CLL. Now, she has stage IV pancreatic adenocarcinoma.  We will go ahead with her third cycle of treatment.  We will see what the CA 19-9 is.  I will have to start her on Neulasta.  She  really needs to have this in my opinion.  I know that her neutrophil count is on the lower side right now.  We still want to try to get her back in a couple weeks.  I know Ms. Dewing is trying her best.  She really is motivated.  She is very tough.   [name], MD 2/2/202211:59 AM

## 2020-09-06 NOTE — Progress Notes (Signed)
Oncology Nurse Navigator Documentation  Oncology Nurse Navigator Flowsheets 09/06/2020  Confirmed Diagnosis Date -  Diagnosis Status -  Phase of Treatment -  Chemotherapy Pending- Reason: -  Chemotherapy Actual Start Date: -  Navigator Follow Up Date: 09/20/2020  Navigator Follow Up Reason: Follow-up Appointment  Navigator Location [facility]  Navigator Encounter Type Treatment;Appt/Treatment Plan Review  Telephone -  Treatment Initiated Date -  Patient Visit Type MedOnc  Treatment Phase Active Tx  Barriers/Navigation Needs Coordination of Care;Education  Education -  Interventions Psycho-Social Support  Acuity Level 2-Minimal Needs (1-2 Barriers Identified)  Coordination of Care -  Education Method -  Support Groups/Services Friends and Family  Time Spent with Patient 30

## 2020-09-06 NOTE — Patient Instructions (Signed)
Implanted Port Insertion, Care After This sheet gives you information about how to care for yourself after your procedure. Your health care provider may also give you more specific instructions. If you have problems or questions, contact your health care provider. What can I expect after the procedure? After the procedure, it is common to have:  Discomfort at the port insertion site.  Bruising on the skin over the port. This should improve over 3-4 days. Follow these instructions at home: Port care  After your port is placed, you will get a manufacturer's information card. The card has information about your port. Keep this card with you at all times.  Take care of the port as told by your health care provider. Ask your health care provider if you or a family member can get training for taking care of the port at home. A home health care nurse may also take care of the port.  Make sure to remember what type of port you have. Incision care  Follow instructions from your health care provider about how to take care of your port insertion site. Make sure you: ? Wash your hands with soap and water before and after you change your bandage (dressing). If soap and water are not available, use hand sanitizer. ? Change your dressing as told by your health care provider. ? Leave stitches (sutures), skin glue, or adhesive strips in place. These skin closures may need to stay in place for 2 weeks or longer. If adhesive strip edges start to loosen and curl up, you may trim the loose edges. Do not remove adhesive strips completely unless your health care provider tells you to do that.  Check your port insertion site every day for signs of infection. Check for: ? Redness, swelling, or pain. ? Fluid or blood. ? Warmth. ? Pus or a bad smell.      Activity  Return to your normal activities as told by your health care provider. Ask your health care provider what activities are safe for you.  Do not  lift anything that is heavier than 10 lb (4.5 kg), or the limit that you are told, until your health care provider says that it is safe. General instructions  Take over-the-counter and prescription medicines only as told by your health care provider.  Do not take baths, swim, or use a hot tub until your health care provider approves. Ask your health care provider if you may take showers. You may only be allowed to take sponge baths.  Do not drive for 24 hours if you were given a sedative during your procedure.  Wear a medical alert bracelet in case of an emergency. This will tell any health care providers that you have a port.  Keep all follow-up visits as told by your health care provider. This is important. Contact a health care provider if:  You cannot flush your port with saline as directed, or you cannot draw blood from the port.  You have a fever or chills.  You have redness, swelling, or pain around your port insertion site.  You have fluid or blood coming from your port insertion site.  Your port insertion site feels warm to the touch.  You have pus or a bad smell coming from the port insertion site. Get help right away if:  You have chest pain or shortness of breath.  You have bleeding from your port that you cannot control. Summary  Take care of the port as told by your   health care provider. Keep the manufacturer's information card with you at all times.  Change your dressing as told by your health care provider.  Contact a health care provider if you have a fever or chills or if you have redness, swelling, or pain around your port insertion site.  Keep all follow-up visits as told by your health care provider. This information is not intended to replace advice given to you by your health care provider. Make sure you discuss any questions you have with your health care provider. Document Revised: 02/17/2018 Document Reviewed: 02/17/2018 Elsevier Patient Education   2021 Elsevier Inc.  

## 2020-09-07 ENCOUNTER — Encounter: Payer: Self-pay | Admitting: *Deleted

## 2020-09-07 LAB — CANCER ANTIGEN 19-9: CA 19-9: 6564 U/mL — ABNORMAL HIGH (ref 0–35)

## 2020-09-08 ENCOUNTER — Inpatient Hospital Stay: Payer: Medicare Other

## 2020-09-08 ENCOUNTER — Other Ambulatory Visit: Payer: Self-pay

## 2020-09-08 VITALS — BP 119/63 | HR 86 | Temp 98.2°F | Resp 18

## 2020-09-08 DIAGNOSIS — C259 Malignant neoplasm of pancreas, unspecified: Secondary | ICD-10-CM | POA: Diagnosis not present

## 2020-09-08 DIAGNOSIS — C787 Secondary malignant neoplasm of liver and intrahepatic bile duct: Secondary | ICD-10-CM | POA: Diagnosis not present

## 2020-09-08 DIAGNOSIS — Z5189 Encounter for other specified aftercare: Secondary | ICD-10-CM | POA: Diagnosis not present

## 2020-09-08 DIAGNOSIS — Z701 Counseling related to patient's sexual behavior and orientation: Secondary | ICD-10-CM | POA: Diagnosis not present

## 2020-09-08 DIAGNOSIS — C7800 Secondary malignant neoplasm of unspecified lung: Secondary | ICD-10-CM | POA: Diagnosis not present

## 2020-09-08 DIAGNOSIS — Z5111 Encounter for antineoplastic chemotherapy: Secondary | ICD-10-CM | POA: Diagnosis not present

## 2020-09-08 MED ORDER — SODIUM CHLORIDE 0.9% FLUSH
10.0000 mL | INTRAVENOUS | Status: DC | PRN
  Administered 2020-09-08: 10 mL
  Filled 2020-09-08: qty 10

## 2020-09-08 MED ORDER — PEGFILGRASTIM-CBQV 6 MG/0.6ML ~~LOC~~ SOSY
6.0000 mg | PREFILLED_SYRINGE | Freq: Once | SUBCUTANEOUS | Status: AC
  Administered 2020-09-08: 6 mg via SUBCUTANEOUS

## 2020-09-08 MED ORDER — PEGFILGRASTIM-CBQV 6 MG/0.6ML ~~LOC~~ SOSY
PREFILLED_SYRINGE | SUBCUTANEOUS | Status: AC
  Filled 2020-09-08: qty 0.6

## 2020-09-08 MED ORDER — HEPARIN SOD (PORK) LOCK FLUSH 100 UNIT/ML IV SOLN
500.0000 [IU] | Freq: Once | INTRAVENOUS | Status: AC | PRN
  Administered 2020-09-08: 500 [IU]
  Filled 2020-09-08: qty 5

## 2020-09-08 NOTE — Patient Instructions (Signed)
Pegfilgrastim injection What is this medicine? PEGFILGRASTIM (PEG fil gra stim) is a long-acting granulocyte colony-stimulating factor that stimulates the growth of neutrophils, a type of white blood cell important in the body's fight against infection. It is used to reduce the incidence of fever and infection in patients with certain types of cancer who are receiving chemotherapy that affects the bone marrow, and to increase survival after being exposed to high doses of radiation. This medicine may be used for other purposes; ask your health care provider or pharmacist if you have questions. COMMON BRAND NAME(S): Fulphila, Neulasta, Nyvepria, UDENYCA, Ziextenzo What should I tell my health care provider before I take this medicine? They need to know if you have any of these conditions:  kidney disease  latex allergy  ongoing radiation therapy  sickle cell disease  skin reactions to acrylic adhesives (On-Body Injector only)  an unusual or allergic reaction to pegfilgrastim, filgrastim, other medicines, foods, dyes, or preservatives  pregnant or trying to get pregnant  breast-feeding How should I use this medicine? This medicine is for injection under the skin. If you get this medicine at home, you will be taught how to prepare and give the pre-filled syringe or how to use the On-body Injector. Refer to the patient Instructions for Use for detailed instructions. Use exactly as directed. Tell your healthcare provider immediately if you suspect that the On-body Injector may not have performed as intended or if you suspect the use of the On-body Injector resulted in a missed or partial dose. It is important that you put your used needles and syringes in a special sharps container. Do not put them in a trash can. If you do not have a sharps container, call your pharmacist or healthcare provider to get one. Talk to your pediatrician regarding the use of this medicine in children. While this drug  may be prescribed for selected conditions, precautions do apply. Overdosage: If you think you have taken too much of this medicine contact a poison control center or emergency room at once. NOTE: This medicine is only for you. Do not share this medicine with others. What if I miss a dose? It is important not to miss your dose. Call your doctor or health care professional if you miss your dose. If you miss a dose due to an On-body Injector failure or leakage, a new dose should be administered as soon as possible using a single prefilled syringe for manual use. What may interact with this medicine? Interactions have not been studied. This list may not describe all possible interactions. Give your health care provider a list of all the medicines, herbs, non-prescription drugs, or dietary supplements you use. Also tell them if you smoke, drink alcohol, or use illegal drugs. Some items may interact with your medicine. What should I watch for while using this medicine? Your condition will be monitored carefully while you are receiving this medicine. You may need blood work done while you are taking this medicine. Talk to your health care provider about your risk of cancer. You may be more at risk for certain types of cancer if you take this medicine. If you are going to need a MRI, CT scan, or other procedure, tell your doctor that you are using this medicine (On-Body Injector only). What side effects may I notice from receiving this medicine? Side effects that you should report to your doctor or health care professional as soon as possible:  allergic reactions (skin rash, itching or hives, swelling of   the face, lips, or tongue)  back pain  dizziness  fever  pain, redness, or irritation at site where injected  pinpoint red spots on the skin  red or dark-brown urine  shortness of breath or breathing problems  stomach or side pain, or pain at the shoulder  swelling  tiredness  trouble  passing urine or change in the amount of urine  unusual bruising or bleeding Side effects that usually do not require medical attention (report to your doctor or health care professional if they continue or are bothersome):  bone pain  muscle pain This list may not describe all possible side effects. Call your doctor for medical advice about side effects. You may report side effects to FDA at 1-800-FDA-1088. Where should I keep my medicine? Keep out of the reach of children. If you are using this medicine at home, you will be instructed on how to store it. Throw away any unused medicine after the expiration date on the label. NOTE: This sheet is a summary. It may not cover all possible information. If you have questions about this medicine, talk to your doctor, pharmacist, or health care provider.  2021 Elsevier/Gold Standard (2019-08-13 13:20:51)  

## 2020-09-15 ENCOUNTER — Encounter: Payer: Self-pay | Admitting: *Deleted

## 2020-09-15 ENCOUNTER — Other Ambulatory Visit: Payer: Self-pay | Admitting: Family

## 2020-09-15 DIAGNOSIS — C259 Malignant neoplasm of pancreas, unspecified: Secondary | ICD-10-CM

## 2020-09-15 DIAGNOSIS — K121 Other forms of stomatitis: Secondary | ICD-10-CM

## 2020-09-15 DIAGNOSIS — C787 Secondary malignant neoplasm of liver and intrahepatic bile duct: Secondary | ICD-10-CM

## 2020-09-15 MED ORDER — MAGIC MOUTHWASH W/LIDOCAINE: 5.0000 mL | Freq: Three times a day (TID) | ORAL | 1 refills | Status: AC | PRN

## 2020-09-15 NOTE — Progress Notes (Signed)
Patient c/o a painful sore to her right throat which is causing difficulty swallowing/eating. She has tried salt water rinses with no improvement.   Reviewed symptoms with [name] NP and we will send prescriptions for biotene and magic mouthwash.  Patient is aware of prescriptions and pharmacy confirmed.   Oncology Nurse Navigator Documentation  Oncology Nurse Navigator Flowsheets 09/15/2020  Confirmed Diagnosis Date -  Diagnosis Status -  Phase of Treatment -  Chemotherapy Pending- Reason: -  Chemotherapy Actual Start Date: -  Navigator Follow Up Date: 09/20/2020  Navigator Follow Up Reason: Follow-up Appointment;Chemotherapy  Production assistant, radio Encounter Type Telephone  Telephone Symptom Mgt;Incoming Call  Treatment Initiated Date -  Patient Visit Type MedOnc  Treatment Phase Active Tx  Barriers/Navigation Needs Coordination of Care;Education  Education Pain/ Symptom Management  Interventions Education;Medication Assistance;Psycho-Social Support  Acuity Level 2-Minimal Needs (1-2 Barriers Identified)  Coordination of Care -  Education Method Verbal  Support Groups/Services Friends and Family  Time Spent with Patient 30

## 2020-09-20 ENCOUNTER — Inpatient Hospital Stay (HOSPITAL_BASED_OUTPATIENT_CLINIC_OR_DEPARTMENT_OTHER): Payer: Medicare Other | Admitting: Hematology & Oncology

## 2020-09-20 ENCOUNTER — Inpatient Hospital Stay: Payer: Medicare Other

## 2020-09-20 ENCOUNTER — Telehealth: Payer: Self-pay

## 2020-09-20 ENCOUNTER — Encounter: Payer: Self-pay | Admitting: *Deleted

## 2020-09-20 ENCOUNTER — Encounter: Payer: Self-pay | Admitting: Hematology & Oncology

## 2020-09-20 ENCOUNTER — Other Ambulatory Visit: Payer: Self-pay

## 2020-09-20 VITALS — HR 96

## 2020-09-20 VITALS — BP 100/49 | HR 109 | Temp 98.1°F | Resp 17 | Wt 140.0 lb

## 2020-09-20 DIAGNOSIS — Z701 Counseling related to patient's sexual behavior and orientation: Secondary | ICD-10-CM | POA: Diagnosis not present

## 2020-09-20 DIAGNOSIS — C787 Secondary malignant neoplasm of liver and intrahepatic bile duct: Secondary | ICD-10-CM

## 2020-09-20 DIAGNOSIS — C9110 Chronic lymphocytic leukemia of B-cell type not having achieved remission: Secondary | ICD-10-CM

## 2020-09-20 DIAGNOSIS — C259 Malignant neoplasm of pancreas, unspecified: Secondary | ICD-10-CM | POA: Diagnosis not present

## 2020-09-20 DIAGNOSIS — C7800 Secondary malignant neoplasm of unspecified lung: Secondary | ICD-10-CM | POA: Diagnosis not present

## 2020-09-20 DIAGNOSIS — Z5189 Encounter for other specified aftercare: Secondary | ICD-10-CM | POA: Diagnosis not present

## 2020-09-20 DIAGNOSIS — Z5111 Encounter for antineoplastic chemotherapy: Secondary | ICD-10-CM | POA: Diagnosis not present

## 2020-09-20 LAB — CBC WITH DIFFERENTIAL (CANCER CENTER ONLY)
Abs Immature Granulocytes: 0.08 10*3/uL — ABNORMAL HIGH (ref 0.00–0.07)
Basophils Absolute: 0.1 10*3/uL (ref 0.0–0.1)
Basophils Relative: 0 %
Eosinophils Absolute: 0 10*3/uL (ref 0.0–0.5)
Eosinophils Relative: 0 %
HCT: 29.7 % — ABNORMAL LOW (ref 36.0–46.0)
Hemoglobin: 10.2 g/dL — ABNORMAL LOW (ref 12.0–15.0)
Immature Granulocytes: 1 %
Lymphocytes Relative: 71 %
Lymphs Abs: 12.3 10*3/uL — ABNORMAL HIGH (ref 0.7–4.0)
MCH: 31.6 pg (ref 26.0–34.0)
MCHC: 34.3 g/dL (ref 30.0–36.0)
MCV: 92 fL (ref 80.0–100.0)
Monocytes Absolute: 0.4 10*3/uL (ref 0.1–1.0)
Monocytes Relative: 2 %
Neutro Abs: 4.5 10*3/uL (ref 1.7–7.7)
Neutrophils Relative %: 26 %
Platelet Count: 184 10*3/uL (ref 150–400)
RBC: 3.23 MIL/uL — ABNORMAL LOW (ref 3.87–5.11)
RDW: 15.7 % — ABNORMAL HIGH (ref 11.5–15.5)
WBC Count: 17.2 10*3/uL — ABNORMAL HIGH (ref 4.0–10.5)
nRBC: 0 % (ref 0.0–0.2)

## 2020-09-20 LAB — LACTATE DEHYDROGENASE: LDH: 118 U/L (ref 98–192)

## 2020-09-20 LAB — CMP (CANCER CENTER ONLY)
ALT: 16 U/L (ref 0–44)
AST: 18 U/L (ref 15–41)
Albumin: 3.8 g/dL (ref 3.5–5.0)
Alkaline Phosphatase: 98 U/L (ref 38–126)
Anion gap: 9 (ref 5–15)
BUN: 10 mg/dL (ref 8–23)
CO2: 26 mmol/L (ref 22–32)
Calcium: 8.9 mg/dL (ref 8.9–10.3)
Chloride: 100 mmol/L (ref 98–111)
Creatinine: 0.66 mg/dL (ref 0.44–1.00)
GFR, Estimated: >60 mL/min (ref >60)
Glucose, Bld: 118 mg/dL — ABNORMAL HIGH (ref 70–99)
Potassium: 3 mmol/L — ABNORMAL LOW (ref 3.5–5.1)
Sodium: 135 mmol/L (ref 135–145)
Total Bilirubin: 0.9 mg/dL (ref 0.3–1.2)
Total Protein: 6 g/dL — ABNORMAL LOW (ref 6.5–8.1)

## 2020-09-20 LAB — SAVE SMEAR(SSMR), FOR PROVIDER SLIDE REVIEW

## 2020-09-20 LAB — CEA (IN HOUSE-CHCC): CEA (CHCC-In House): 97.31 ng/mL — ABNORMAL HIGH (ref 0.00–5.00)

## 2020-09-20 MED ORDER — HEPARIN SOD (PORK) LOCK FLUSH 100 UNIT/ML IV SOLN
500.0000 [IU] | Freq: Once | INTRAVENOUS | Status: DC | PRN
  Filled 2020-09-20: qty 5

## 2020-09-20 MED ORDER — ATROPINE SULFATE 1 MG/ML IJ SOLN
0.5000 mg | Freq: Once | INTRAMUSCULAR | Status: AC | PRN
  Administered 2020-09-20: 0.5 mg via INTRAVENOUS

## 2020-09-20 MED ORDER — DEXTROSE 5 % IV SOLN
Freq: Once | INTRAVENOUS | Status: AC
  Filled 2020-09-20: qty 250

## 2020-09-20 MED ORDER — SODIUM CHLORIDE 0.9 % IV SOLN
120.0000 mg/m2 | Freq: Once | INTRAVENOUS | Status: AC
  Administered 2020-09-20: 220 mg via INTRAVENOUS
  Filled 2020-09-20: qty 5

## 2020-09-20 MED ORDER — SODIUM CHLORIDE 0.9 % IV SOLN
396.0000 mg/m2 | Freq: Once | INTRAVENOUS | Status: AC
  Administered 2020-09-20: 700 mg via INTRAVENOUS
  Filled 2020-09-20: qty 35

## 2020-09-20 MED ORDER — SODIUM CHLORIDE 0.9 % IV SOLN
1920.0000 mg/m2 | INTRAVENOUS | Status: DC
  Administered 2020-09-20: 3400 mg via INTRAVENOUS
  Filled 2020-09-20: qty 68

## 2020-09-20 MED ORDER — SODIUM CHLORIDE 0.9 % IV SOLN
150.0000 mg | Freq: Once | INTRAVENOUS | Status: AC
  Administered 2020-09-20: 150 mg via INTRAVENOUS
  Filled 2020-09-20: qty 150

## 2020-09-20 MED ORDER — HEPARIN SOD (PORK) LOCK FLUSH 100 UNIT/ML IV SOLN
500.0000 [IU] | Freq: Once | INTRAVENOUS | Status: DC
  Filled 2020-09-20: qty 5

## 2020-09-20 MED ORDER — PALONOSETRON HCL INJECTION 0.25 MG/5ML
INTRAVENOUS | Status: AC
  Filled 2020-09-20: qty 5

## 2020-09-20 MED ORDER — OXALIPLATIN CHEMO INJECTION 100 MG/20ML
68.0000 mg/m2 | Freq: Once | INTRAVENOUS | Status: AC
  Administered 2020-09-20: 120 mg via INTRAVENOUS
  Filled 2020-09-20: qty 20

## 2020-09-20 MED ORDER — ATROPINE SULFATE 1 MG/ML IJ SOLN
INTRAMUSCULAR | Status: AC
  Filled 2020-09-20: qty 1

## 2020-09-20 MED ORDER — SODIUM CHLORIDE 0.9% FLUSH
10.0000 mL | INTRAVENOUS | Status: DC | PRN
  Administered 2020-09-20: 10 mL via INTRAVENOUS
  Filled 2020-09-20: qty 10

## 2020-09-20 MED ORDER — SODIUM CHLORIDE 0.9 % IV SOLN
10.0000 mg | Freq: Once | INTRAVENOUS | Status: AC
  Administered 2020-09-20: 10 mg via INTRAVENOUS
  Filled 2020-09-20: qty 10

## 2020-09-20 MED ORDER — SODIUM CHLORIDE 0.9% FLUSH
10.0000 mL | INTRAVENOUS | Status: DC | PRN
  Filled 2020-09-20: qty 10

## 2020-09-20 MED ORDER — PALONOSETRON HCL INJECTION 0.25 MG/5ML
0.2500 mg | Freq: Once | INTRAVENOUS | Status: AC
  Administered 2020-09-20: 0.25 mg via INTRAVENOUS

## 2020-09-20 NOTE — Telephone Encounter (Signed)
appts made per 09/20/20 los, pt to rec appts in tx/avs     Tamara Powell

## 2020-09-20 NOTE — Progress Notes (Signed)
Oncology Nurse Navigator Documentation  Oncology Nurse Navigator Flowsheets 09/20/2020  Confirmed Diagnosis Date -  Diagnosis Status -  Phase of Treatment -  Chemotherapy Pending- Reason: -  Chemotherapy Actual Start Date: -  Navigator Follow Up Date: 09/22/2020  Navigator Follow Up Reason: Appointment Review  Navigator Location [facility]  Navigator Encounter Type Treatment;Appt/Treatment Plan Review  Telephone -  Treatment Initiated Date -  Patient Visit Type MedOnc  Treatment Phase Active Tx  Barriers/Navigation Needs Coordination of Care;Education  Education Other  Interventions Coordination of Care;Education;Psycho-Social Support  Acuity Level 2-Minimal Needs (1-2 Barriers Identified)  Coordination of Care Appts;Radiology  Education Method Verbal  Support Groups/Services Friends and Family  Time Spent with Patient 73

## 2020-09-20 NOTE — Patient Instructions (Signed)
[facility] Discharge Instructions for Patients Receiving Chemotherapy  Today you received the following chemotherapy agents Oxaliplatin, Irinotecan, Leucovorin and 5FU  To help prevent nausea and vomiting after your treatment, we encourage you to take your nausea medication as prescribed by MD. **DO NOT TAKE ZOFRAN FOR 3 DAYS AFTER CHEMOTHERAPY**   If you develop nausea and vomiting that is not controlled by your nausea medication, call the clinic.   BELOW ARE SYMPTOMS THAT SHOULD BE REPORTED IMMEDIATELY:  *FEVER GREATER THAN 100.5 F  *CHILLS WITH OR WITHOUT FEVER  NAUSEA AND VOMITING THAT IS NOT CONTROLLED WITH YOUR NAUSEA MEDICATION  *UNUSUAL SHORTNESS OF BREATH  *UNUSUAL BRUISING OR BLEEDING  TENDERNESS IN MOUTH AND THROAT WITH OR WITHOUT PRESENCE OF ULCERS  *URINARY PROBLEMS  *BOWEL PROBLEMS  UNUSUAL RASH Items with * indicate a potential emergency and should be followed up as soon as possible.  Feel free to call the clinic should you have any questions or concerns. The clinic phone number is [phone].  Please show the [place] at check-in to the Emergency Department and triage nurse.

## 2020-09-20 NOTE — Patient Instructions (Signed)

## 2020-09-20 NOTE — Progress Notes (Signed)
Hematology and Oncology Follow Up Visit  Tamara Powell [id number] Dec 08, 1945 75 y.o. 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  The big news is that tomorrow is her 75th birthday.  I was so excited for her.  It sounds like she will have a nice birthday celebration this weekend.  The Neulasta really helped with her neutrophils.  Her neutrophil count is a whole lot better today.  Her last CA 19-9 went down to 6564.  This is a very nice drop.  Hopefully will see a continued improvement.  She is having some slight discomfort in the right lower quadrant of her abdomen.  There is no problems with her bowels.  There is no diarrhea.  She is not having any urinary issues.  She is eating fairly well.  She is having a little bit of nausea but no vomiting..  She has occasional mouth sores.  We put her on a mouth rinse to try to help this.  She has had no bleeding.  She has had no fever.  There have been no leg swelling.  Overall, her performance status is ECOG 1.      Medications:  Current Outpatient Medications:  .  b complex vitamins capsule, Take 1 capsule by mouth daily., Disp: , Rfl:  .  calcium carbonate (OS-CAL) 600 MG TABS, Take 600 mg by mouth 2 (two) times daily with a meal., Disp: , Rfl:  .  Cholecalciferol 25 MCG (1000 UT) tablet, 1,000 Units daily. (Patient not taking: Reported on 08/23/2020), Disp: , Rfl:  .  co-enzyme Q-10 30 MG capsule, Take 100 mg by mouth 2 (two) times daily. (Patient not taking: Reported on 08/23/2020), Disp: , Rfl:  .  COLLAGEN PO, Take by mouth. One scoop of powder daily in coffee. (Patient not taking: Reported on 08/23/2020), Disp: , Rfl:  .  dexamethasone (DECADRON) 4 MG tablet, Take 2  tablets (8 mg total) by mouth daily. Start the day after chemotherapy for 3 days. Take with food., Disp: 8 tablet, Rfl: 5 .  dicyclomine (BENTYL) 10 MG capsule, Take 10 mg by mouth every 6 (six) hours as needed., Disp: , Rfl:  .  fish oil-omega-3 fatty acids 1000 MG capsule, Take 1 g by mouth 2 (two) times daily. (Patient not taking: Reported on 08/23/2020), Disp: , Rfl:  .  Green Tea, Camellia sinensis, (GREEN TEA EXTRACT PO), Take by mouth. Takes 3 caps daily. (Patient not taking: Reported on 08/23/2020), Disp: , Rfl:  .  lidocaine-prilocaine (EMLA) cream, Apply to affected area once (Patient not taking: No sig reported), Disp: 30 g, Rfl: 3 .  loperamide (IMODIUM A-D) 2 MG tablet, Take 2 at onset of diarrhea, then 1 every 2hrs until 12hr without a BM. May take 2 tab every 4hrs at bedtime. If diarrhea recurs repeat. (Patient not taking: No sig reported), Disp: 100 tablet, Rfl: 1 .  magic mouthwash w/lidocaine SOLN, Take 5 mLs by mouth 3 (three) times daily as needed for mouth pain., Disp: 450 mL, Rfl: 1 .  Multiple Vitamin (MULTIVITAMIN) tablet, Take 1 tablet by mouth daily., Disp: , Rfl:  .  Nutritional Supplements (GRAPESEED EXTRACT) 500-50 MG CAPS, Take by mouth 2 (two) times  daily. (Patient not taking: Reported on 08/23/2020), Disp: , Rfl:  .  nystatin (MYCOSTATIN) 100000 UNIT/ML suspension, Take by mouth., Disp: , Rfl:  .  omeprazole (PRILOSEC) 20 MG capsule, Take 20 mg by mouth every morning., Disp: , Rfl:  .  ondansetron (ZOFRAN) 8 MG tablet, Take 1 tablet (8 mg total) by mouth 2 (two) times daily as needed. Start on day 3 after chemotherapy., Disp: 30 tablet, Rfl: 1 .  OVER THE COUNTER MEDICATION, Take by mouth 2 (two) times daily. WELLNESS FORMULA CAPSULE--HAIR SKIN NAILS CAPSULES. (Patient not taking: Reported on 08/23/2020), Disp: , Rfl:  .  OVER THE COUNTER MEDICATION, Green Vibrance daily. (Patient not taking: Reported on 08/23/2020), Disp: , Rfl:  .  oxyCODONE (OXY IR/ROXICODONE) 5 MG  immediate release tablet, Take 1 tablet (5 mg total) by mouth every 4 (four) hours as needed for severe pain., Disp: 30 tablet, Rfl: 0 .  oxyCODONE-acetaminophen (PERCOCET/ROXICET) 5-325 MG tablet, Take by mouth every 6 (six) hours as needed for severe pain., Disp: , Rfl:  .  prochlorperazine (COMPAZINE) 10 MG tablet, Take 1 tablet (10 mg total) by mouth every 6 (six) hours as needed (Nausea or vomiting)., Disp: 30 tablet, Rfl: 1 .  Turmeric 450 MG CAPS, Take by mouth every morning. (Patient not taking: Reported on 08/23/2020), Disp: , Rfl:  .  vitamin E 180 MG (400 UNITS) capsule, 400 Units daily. (Patient not taking: Reported on 08/23/2020), Disp: , Rfl:   Allergies:  Allergies  Allergen Reactions  . Alendronate Swelling  . Ibandronic Acid Swelling  . Latex Hives    Past Medical History, Surgical history, Social history, and Family History were reviewed and updated.  Review of Systems: Review of Systems  Constitutional: Positive for unexpected weight change.  HENT:  Negative.   Eyes: Negative.   Respiratory: Negative.   Gastrointestinal: Positive for abdominal pain and diarrhea. Negative for abdominal distention.  Endocrine: Negative.   Genitourinary: Negative.    Musculoskeletal: Negative.   Skin: Negative.   Neurological: Negative.   Hematological: Negative.   Psychiatric/Behavioral: Negative.     Physical Exam:  weight is 140 lb (63.5 kg). Her oral temperature is 98.1 F (36.7 C). Her blood pressure is 100/49 (abnormal) and her pulse is 109 (abnormal). Her respiration is 17 and oxygen saturation is 100%.   Wt Readings from Last 3 Encounters:  09/20/20 140 lb (63.5 kg)  09/06/20 142 lb (64.4 kg)  08/23/20 140 lb 1.9 oz (63.6 kg)    Physical Exam Vitals reviewed.  HENT:     Head: Normocephalic and atraumatic.  Eyes:     Pupils: Pupils are equal, round, and reactive to light.  Cardiovascular:     Rate and Rhythm: Normal rate and regular rhythm.     Heart sounds:  Normal heart sounds.  Pulmonary:     Effort: Pulmonary effort is normal.     Breath sounds: Normal breath sounds.  Abdominal:     General: Bowel sounds are normal.     Palpations: Abdomen is soft.  Musculoskeletal:        General: No tenderness or deformity. Normal range of motion.     Cervical back: Normal range of motion.  Lymphadenopathy:     Cervical: No cervical adenopathy.  Skin:    General: Skin is warm and dry.     Findings: No erythema or rash.  Neurological:     Mental Status: She is alert and oriented to person, place, and time.  Psychiatric:  Behavior: Behavior normal.        Thought Content: Thought content normal.        Judgment: Judgment normal.      Lab Results  Component Value Date   WBC 17.2 (H) 09/20/2020   HGB 10.2 (L) 09/20/2020   HCT 29.7 (L) 09/20/2020   MCV 92.0 09/20/2020   PLT 184 09/20/2020     Chemistry      Component Value Date/Time   NA 135 09/20/2020 0909   NA 139 11/11/2016 1426   NA 142 08/30/2015 0941   K 3.0 (L) 09/20/2020 0909   K 4.1 11/11/2016 1426   K 4.9 08/30/2015 0941   CL 100 09/20/2020 0909   CL 103 11/11/2016 1426   CO2 26 09/20/2020 0909   CO2 28 11/11/2016 1426   CO2 27 08/30/2015 0941   BUN 10 09/20/2020 0909   BUN 12 11/11/2016 1426   BUN 11.8 08/30/2015 0941   CREATININE 0.66 09/20/2020 0909   CREATININE 0.9 11/11/2016 1426   CREATININE 1.0 08/30/2015 0941      Component Value Date/Time   CALCIUM 8.9 09/20/2020 0909   CALCIUM 9.4 11/11/2016 1426   CALCIUM 9.5 08/30/2015 0941   ALKPHOS 98 09/20/2020 0909   ALKPHOS 65 11/11/2016 1426   ALKPHOS 62 08/30/2015 0941   AST 18 09/20/2020 0909   AST 19 08/30/2015 0941   ALT 16 09/20/2020 0909   ALT 20 11/11/2016 1426   ALT 13 08/30/2015 0941   BILITOT 0.9 09/20/2020 0909   BILITOT 1.25 (H) 08/30/2015 0941      Impression and Plan: Tamara Powell is a very charming 75 year old white female with a long history of CLL. Now, she has stage IV pancreatic  adenocarcinoma.  We will go ahead with her fourth cycle of treatment.  We will see what the CA 19-9 is.  Hopefully, it will be dropping.  We will continue her on Neulasta.  I think this is very helpful.  We will go ahead and scan her after this cycle.  I think this will be very important.  I am going to give her an extra week off after this cycle just to have some recovery.  I think this would be important for her.  We will get the CT scan during this week off.  I will see her back in 3 weeks.  [name], MD 2/16/20229:59 AM

## 2020-09-21 ENCOUNTER — Telehealth: Payer: Self-pay

## 2020-09-21 LAB — CANCER ANTIGEN 19-9: CA 19-9: 4115 U/mL — ABNORMAL HIGH (ref 0–35)

## 2020-09-21 NOTE — Telephone Encounter (Signed)
-----   Message from [name], MD sent at 09/21/2020  7:02 AM EST ----- Call - the tumor level is still coming down!!!!  [place] job!!! Laurey Arrow

## 2020-09-21 NOTE — Telephone Encounter (Signed)
Called and informed patient of lab results, patient appreciative of call and denies any questions or concerns at this time.

## 2020-09-22 ENCOUNTER — Inpatient Hospital Stay: Payer: Medicare Other

## 2020-09-22 ENCOUNTER — Other Ambulatory Visit: Payer: Self-pay

## 2020-09-22 ENCOUNTER — Encounter: Payer: Self-pay | Admitting: *Deleted

## 2020-09-22 VITALS — BP 123/56 | HR 82 | Temp 97.9°F | Resp 16

## 2020-09-22 DIAGNOSIS — C259 Malignant neoplasm of pancreas, unspecified: Secondary | ICD-10-CM | POA: Diagnosis not present

## 2020-09-22 DIAGNOSIS — Z5189 Encounter for other specified aftercare: Secondary | ICD-10-CM | POA: Diagnosis not present

## 2020-09-22 DIAGNOSIS — Z5111 Encounter for antineoplastic chemotherapy: Secondary | ICD-10-CM | POA: Diagnosis not present

## 2020-09-22 DIAGNOSIS — C7800 Secondary malignant neoplasm of unspecified lung: Secondary | ICD-10-CM | POA: Diagnosis not present

## 2020-09-22 DIAGNOSIS — C787 Secondary malignant neoplasm of liver and intrahepatic bile duct: Secondary | ICD-10-CM

## 2020-09-22 DIAGNOSIS — Z701 Counseling related to patient's sexual behavior and orientation: Secondary | ICD-10-CM | POA: Diagnosis not present

## 2020-09-22 MED ORDER — PEGFILGRASTIM-CBQV 6 MG/0.6ML ~~LOC~~ SOSY
6.0000 mg | PREFILLED_SYRINGE | Freq: Once | SUBCUTANEOUS | Status: AC
  Administered 2020-09-22: 6 mg via SUBCUTANEOUS

## 2020-09-22 MED ORDER — SODIUM CHLORIDE 0.9% FLUSH
10.0000 mL | INTRAVENOUS | Status: DC | PRN
  Administered 2020-09-22: 10 mL
  Filled 2020-09-22: qty 10

## 2020-09-22 MED ORDER — HEPARIN SOD (PORK) LOCK FLUSH 100 UNIT/ML IV SOLN
500.0000 [IU] | Freq: Once | INTRAVENOUS | Status: AC | PRN
  Administered 2020-09-22: 500 [IU]
  Filled 2020-09-22: qty 5

## 2020-09-22 MED ORDER — PEGFILGRASTIM-CBQV 6 MG/0.6ML ~~LOC~~ SOSY
PREFILLED_SYRINGE | SUBCUTANEOUS | Status: AC
  Filled 2020-09-22: qty 0.6

## 2020-09-22 NOTE — Patient Instructions (Signed)
Pegfilgrastim injection What is this medicine? PEGFILGRASTIM (PEG fil gra stim) is a long-acting granulocyte colony-stimulating factor that stimulates the growth of neutrophils, a type of white blood cell important in the body's fight against infection. It is used to reduce the incidence of fever and infection in patients with certain types of cancer who are receiving chemotherapy that affects the bone marrow, and to increase survival after being exposed to high doses of radiation. This medicine may be used for other purposes; ask your health care provider or pharmacist if you have questions. COMMON BRAND NAME(S): Fulphila, Neulasta, Nyvepria, UDENYCA, Ziextenzo What should I tell my health care provider before I take this medicine? They need to know if you have any of these conditions:  kidney disease  latex allergy  ongoing radiation therapy  sickle cell disease  skin reactions to acrylic adhesives (On-Body Injector only)  an unusual or allergic reaction to pegfilgrastim, filgrastim, other medicines, foods, dyes, or preservatives  pregnant or trying to get pregnant  breast-feeding How should I use this medicine? This medicine is for injection under the skin. If you get this medicine at home, you will be taught how to prepare and give the pre-filled syringe or how to use the On-body Injector. Refer to the patient Instructions for Use for detailed instructions. Use exactly as directed. Tell your healthcare provider immediately if you suspect that the On-body Injector may not have performed as intended or if you suspect the use of the On-body Injector resulted in a missed or partial dose. It is important that you put your used needles and syringes in a special sharps container. Do not put them in a trash can. If you do not have a sharps container, call your pharmacist or healthcare provider to get one. Talk to your pediatrician regarding the use of this medicine in children. While this drug  may be prescribed for selected conditions, precautions do apply. Overdosage: If you think you have taken too much of this medicine contact a poison control center or emergency room at once. NOTE: This medicine is only for you. Do not share this medicine with others. What if I miss a dose? It is important not to miss your dose. Call your doctor or health care professional if you miss your dose. If you miss a dose due to an On-body Injector failure or leakage, a new dose should be administered as soon as possible using a single prefilled syringe for manual use. What may interact with this medicine? Interactions have not been studied. This list may not describe all possible interactions. Give your health care provider a list of all the medicines, herbs, non-prescription drugs, or dietary supplements you use. Also tell them if you smoke, drink alcohol, or use illegal drugs. Some items may interact with your medicine. What should I watch for while using this medicine? Your condition will be monitored carefully while you are receiving this medicine. You may need blood work done while you are taking this medicine. Talk to your health care provider about your risk of cancer. You may be more at risk for certain types of cancer if you take this medicine. If you are going to need a MRI, CT scan, or other procedure, tell your doctor that you are using this medicine (On-Body Injector only). What side effects may I notice from receiving this medicine? Side effects that you should report to your doctor or health care professional as soon as possible:  allergic reactions (skin rash, itching or hives, swelling of   the face, lips, or tongue)  back pain  dizziness  fever  pain, redness, or irritation at site where injected  pinpoint red spots on the skin  red or dark-brown urine  shortness of breath or breathing problems  stomach or side pain, or pain at the shoulder  swelling  tiredness  trouble  passing urine or change in the amount of urine  unusual bruising or bleeding Side effects that usually do not require medical attention (report to your doctor or health care professional if they continue or are bothersome):  bone pain  muscle pain This list may not describe all possible side effects. Call your doctor for medical advice about side effects. You may report side effects to FDA at 1-800-FDA-1088. Where should I keep my medicine? Keep out of the reach of children. If you are using this medicine at home, you will be instructed on how to store it. Throw away any unused medicine after the expiration date on the label. NOTE: This sheet is a summary. It may not cover all possible information. If you have questions about this medicine, talk to your doctor, pharmacist, or health care provider.  2021 Elsevier/Gold Standard (2019-08-13 13:20:51) Fluorouracil, 5-FU injection What is this medicine? FLUOROURACIL, 5-FU (flure oh YOOR a sil) is a chemotherapy drug. It slows the growth of cancer cells. This medicine is used to treat many types of cancer like breast cancer, colon or rectal cancer, pancreatic cancer, and stomach cancer. This medicine may be used for other purposes; ask your health care provider or pharmacist if you have questions. COMMON BRAND NAME(S): Adrucil What should I tell my health care provider before I take this medicine? They need to know if you have any of these conditions:  blood disorders  dihydropyrimidine dehydrogenase (DPD) deficiency  infection (especially a virus infection such as chickenpox, cold sores, or herpes)  kidney disease  liver disease  malnourished, poor nutrition  recent or ongoing radiation therapy  an unusual or allergic reaction to fluorouracil, other chemotherapy, other medicines, foods, dyes, or preservatives  pregnant or trying to get pregnant  breast-feeding How should I use this medicine? This drug is given as an infusion or  injection into a vein. It is administered in a hospital or clinic by a specially trained health care professional. Talk to your pediatrician regarding the use of this medicine in children. Special care may be needed. Overdosage: If you think you have taken too much of this medicine contact a poison control center or emergency room at once. NOTE: This medicine is only for you. Do not share this medicine with others. What if I miss a dose? It is important not to miss your dose. Call your doctor or health care professional if you are unable to keep an appointment. What may interact with this medicine? Do not take this medicine with any of the following medications:  live virus vaccines This medicine may also interact with the following medications:  medicines that treat or prevent blood clots like warfarin, enoxaparin, and dalteparin This list may not describe all possible interactions. Give your health care provider a list of all the medicines, herbs, non-prescription drugs, or dietary supplements you use. Also tell them if you smoke, drink alcohol, or use illegal drugs. Some items may interact with your medicine. What should I watch for while using this medicine? Visit your doctor for checks on your progress. This drug may make you feel generally unwell. This is not uncommon, as chemotherapy can affect healthy cells  as well as cancer cells. Report any side effects. Continue your course of treatment even though you feel ill unless your doctor tells you to stop. In some cases, you may be given additional medicines to help with side effects. Follow all directions for their use. Call your doctor or health care professional for advice if you get a fever, chills or sore throat, or other symptoms of a cold or flu. Do not treat yourself. This drug decreases your body's ability to fight infections. Try to avoid being around people who are sick. This medicine may increase your risk to bruise or bleed. Call  your doctor or health care professional if you notice any unusual bleeding. Be careful brushing and flossing your teeth or using a toothpick because you may get an infection or bleed more easily. If you have any dental work done, tell your dentist you are receiving this medicine. Avoid taking products that contain aspirin, acetaminophen, ibuprofen, naproxen, or ketoprofen unless instructed by your doctor. These medicines may hide a fever. Do not become pregnant while taking this medicine. Women should inform their doctor if they wish to become pregnant or think they might be pregnant. There is a potential for serious side effects to an unborn child. Talk to your health care professional or pharmacist for more information. Do not breast-feed an infant while taking this medicine. Men should inform their doctor if they wish to father a child. This medicine may lower sperm counts. Do not treat diarrhea with over the counter products. Contact your doctor if you have diarrhea that lasts more than 2 days or if it is severe and watery. This medicine can make you more sensitive to the sun. Keep out of the sun. If you cannot avoid being in the sun, wear protective clothing and use sunscreen. Do not use sun lamps or tanning beds/booths. What side effects may I notice from receiving this medicine? Side effects that you should report to your doctor or health care professional as soon as possible:  allergic reactions like skin rash, itching or hives, swelling of the face, lips, or tongue  low blood counts - this medicine may decrease the number of white blood cells, red blood cells and platelets. You may be at increased risk for infections and bleeding.  signs of infection - fever or chills, cough, sore throat, pain or difficulty passing urine  signs of decreased platelets or bleeding - bruising, pinpoint red spots on the skin, black, tarry stools, blood in the urine  signs of decreased red blood cells -  unusually weak or tired, fainting spells, lightheadedness  breathing problems  changes in vision  chest pain  mouth sores  nausea and vomiting  pain, swelling, redness at site where injected  pain, tingling, numbness in the hands or feet  redness, swelling, or sores on hands or feet  stomach pain  unusual bleeding Side effects that usually do not require medical attention (report to your doctor or health care professional if they continue or are bothersome):  changes in finger or toe nails  diarrhea  dry or itchy skin  hair loss  headache  loss of appetite  sensitivity of eyes to the light  stomach upset  unusually teary eyes This list may not describe all possible side effects. Call your doctor for medical advice about side effects. You may report side effects to FDA at 1-800-FDA-1088. Where should I keep my medicine? This drug is given in a hospital or clinic and will not be stored  at home. NOTE: This sheet is a summary. It may not cover all possible information. If you have questions about this medicine, talk to your doctor, pharmacist, or health care provider.  2021 Elsevier/Gold Standard (2019-06-22 15:00:03)

## 2020-09-22 NOTE — Progress Notes (Signed)
CT scheduled for patient. When here for pump dc gave patient calendar of appointments and reviewed all appointments with patient. Patient understand she is NPO 4hrs, and must drink contrast 2 and 1 hour prior to CT. Instructions also written on calendar. She has contrast at home. Appointment also made for port access.   Oncology Nurse Navigator Documentation  Oncology Nurse Navigator Flowsheets 09/22/2020  Confirmed Diagnosis Date -  Diagnosis Status -  Phase of Treatment -  Chemotherapy Pending- Reason: -  Chemotherapy Actual Start Date: -  Navigator Follow Up Date: 10/09/2020  Navigator Follow Up Reason: Scan Review  Navigator Location [facility]  Navigator Encounter Type Appt/Treatment Plan Review;Education  Telephone -  Treatment Initiated Date -  Patient Visit Type MedOnc  Treatment Phase Active Tx  Barriers/Navigation Needs Coordination of Care;Education  Education Other  Interventions Coordination of Care;Education;Psycho-Social Support  Acuity Level 2-Minimal Needs (1-2 Barriers Identified)  Coordination of Care Appts;Radiology  Education Method Verbal;Written;Teach-back  Support Groups/Services Friends and Family  Time Spent with Patient 66

## 2020-09-25 ENCOUNTER — Other Ambulatory Visit: Payer: Self-pay

## 2020-09-25 ENCOUNTER — Inpatient Hospital Stay: Payer: Medicare Other

## 2020-09-25 ENCOUNTER — Telehealth: Payer: Self-pay | Admitting: *Deleted

## 2020-09-25 ENCOUNTER — Other Ambulatory Visit: Payer: Self-pay | Admitting: *Deleted

## 2020-09-25 DIAGNOSIS — C787 Secondary malignant neoplasm of liver and intrahepatic bile duct: Secondary | ICD-10-CM | POA: Diagnosis not present

## 2020-09-25 DIAGNOSIS — R112 Nausea with vomiting, unspecified: Secondary | ICD-10-CM | POA: Diagnosis not present

## 2020-09-25 DIAGNOSIS — Z5111 Encounter for antineoplastic chemotherapy: Secondary | ICD-10-CM | POA: Diagnosis not present

## 2020-09-25 DIAGNOSIS — Z95828 Presence of other vascular implants and grafts: Secondary | ICD-10-CM

## 2020-09-25 DIAGNOSIS — Z5189 Encounter for other specified aftercare: Secondary | ICD-10-CM | POA: Diagnosis not present

## 2020-09-25 DIAGNOSIS — K529 Noninfective gastroenteritis and colitis, unspecified: Secondary | ICD-10-CM | POA: Diagnosis not present

## 2020-09-25 DIAGNOSIS — C259 Malignant neoplasm of pancreas, unspecified: Secondary | ICD-10-CM

## 2020-09-25 DIAGNOSIS — K625 Hemorrhage of anus and rectum: Secondary | ICD-10-CM | POA: Diagnosis not present

## 2020-09-25 DIAGNOSIS — C801 Malignant (primary) neoplasm, unspecified: Secondary | ICD-10-CM

## 2020-09-25 DIAGNOSIS — C7800 Secondary malignant neoplasm of unspecified lung: Secondary | ICD-10-CM | POA: Diagnosis not present

## 2020-09-25 DIAGNOSIS — D649 Anemia, unspecified: Secondary | ICD-10-CM | POA: Diagnosis not present

## 2020-09-25 DIAGNOSIS — R1110 Vomiting, unspecified: Secondary | ICD-10-CM | POA: Diagnosis not present

## 2020-09-25 DIAGNOSIS — Z701 Counseling related to patient's sexual behavior and orientation: Secondary | ICD-10-CM | POA: Diagnosis not present

## 2020-09-25 LAB — CMP (CANCER CENTER ONLY)
ALT: 14 U/L (ref 0–44)
AST: 19 U/L (ref 15–41)
Albumin: 3.9 g/dL (ref 3.5–5.0)
Alkaline Phosphatase: 92 U/L (ref 38–126)
Anion gap: 10 (ref 5–15)
BUN: 18 mg/dL (ref 8–23)
CO2: 24 mmol/L (ref 22–32)
Calcium: 8.8 mg/dL — ABNORMAL LOW (ref 8.9–10.3)
Chloride: 101 mmol/L (ref 98–111)
Creatinine: 0.6 mg/dL (ref 0.44–1.00)
GFR, Estimated: >60 mL/min (ref >60)
Glucose, Bld: 115 mg/dL — ABNORMAL HIGH (ref 70–99)
Potassium: 3.2 mmol/L — ABNORMAL LOW (ref 3.5–5.1)
Sodium: 135 mmol/L (ref 135–145)
Total Bilirubin: 0.9 mg/dL (ref 0.3–1.2)
Total Protein: 6.1 g/dL — ABNORMAL LOW (ref 6.5–8.1)

## 2020-09-25 LAB — CBC WITH DIFFERENTIAL (CANCER CENTER ONLY)
Abs Immature Granulocytes: 0.01 10*3/uL (ref 0.00–0.07)
Basophils Absolute: 0.1 10*3/uL (ref 0.0–0.1)
Basophils Relative: 0 %
Eosinophils Absolute: 0.1 10*3/uL (ref 0.0–0.5)
Eosinophils Relative: 0 %
HCT: 31.7 % — ABNORMAL LOW (ref 36.0–46.0)
Hemoglobin: 10.5 g/dL — ABNORMAL LOW (ref 12.0–15.0)
Immature Granulocytes: 0 %
Lymphocytes Relative: 96 %
Lymphs Abs: 24.9 10*3/uL — ABNORMAL HIGH (ref 0.7–4.0)
MCH: 31 pg (ref 26.0–34.0)
MCHC: 33.1 g/dL (ref 30.0–36.0)
MCV: 93.5 fL (ref 80.0–100.0)
Monocytes Absolute: 0.1 10*3/uL (ref 0.1–1.0)
Monocytes Relative: 0 %
Neutro Abs: 1 10*3/uL — ABNORMAL LOW (ref 1.7–7.7)
Neutrophils Relative %: 4 %
Platelet Count: 206 10*3/uL (ref 150–400)
RBC: 3.39 MIL/uL — ABNORMAL LOW (ref 3.87–5.11)
RDW: 15.7 % — ABNORMAL HIGH (ref 11.5–15.5)
WBC Count: 26.1 10*3/uL — ABNORMAL HIGH (ref 4.0–10.5)
nRBC: 0 % (ref 0.0–0.2)

## 2020-09-25 MED ORDER — SODIUM CHLORIDE 0.9 % IV SOLN
20.0000 mg | Freq: Once | INTRAVENOUS | Status: AC
  Administered 2020-09-25: 20 mg via INTRAVENOUS
  Filled 2020-09-25: qty 20

## 2020-09-25 MED ORDER — HEPARIN SOD (PORK) LOCK FLUSH 100 UNIT/ML IV SOLN
500.0000 [IU] | Freq: Once | INTRAVENOUS | Status: AC
  Administered 2020-09-25: 500 [IU] via INTRAVENOUS
  Filled 2020-09-25: qty 5

## 2020-09-25 MED ORDER — SODIUM CHLORIDE 0.9 % IV SOLN
16.0000 mg | Freq: Once | INTRAVENOUS | Status: AC
  Administered 2020-09-25: 16 mg via INTRAVENOUS
  Filled 2020-09-25: qty 8

## 2020-09-25 MED ORDER — SODIUM CHLORIDE 0.9% FLUSH
10.0000 mL | Freq: Once | INTRAVENOUS | Status: AC
  Administered 2020-09-25: 10 mL via INTRAVENOUS
  Filled 2020-09-25: qty 10

## 2020-09-25 MED ORDER — DEXAMETHASONE SODIUM PHOSPHATE 10 MG/ML IJ SOLN: 20.0000 mg | Freq: Once | INTRAMUSCULAR | Status: DC

## 2020-09-25 MED ORDER — SODIUM CHLORIDE 0.9 % IV SOLN
Freq: Once | INTRAVENOUS | Status: AC
  Filled 2020-09-25: qty 250

## 2020-09-25 NOTE — Telephone Encounter (Signed)
Call received from patient's husband stating that pt has had nausea and vomiting since yesterday evening and would like to know if pt can come in for IVF's today.  He states that pt is not able to take anything by mouth without vomiting.  Dr. Marin Olp notified.  Order received for pt to come in now for labs and IVF's.  Pt.'s husband states that he will bring pt in now and is appreciative of assistance.

## 2020-09-25 NOTE — Patient Instructions (Signed)
Implanted Port Insertion, Care After This sheet gives you information about how to care for yourself after your procedure. Your health care provider may also give you more specific instructions. If you have problems or questions, contact your health care provider. What can I expect after the procedure? After the procedure, it is common to have:  Discomfort at the port insertion site.  Bruising on the skin over the port. This should improve over 3-4 days. Follow these instructions at home: Port care  After your port is placed, you will get a manufacturer's information card. The card has information about your port. Keep this card with you at all times.  Take care of the port as told by your health care provider. Ask your health care provider if you or a family member can get training for taking care of the port at home. A home health care nurse may also take care of the port.  Make sure to remember what type of port you have. Incision care  Follow instructions from your health care provider about how to take care of your port insertion site. Make sure you: ? Wash your hands with soap and water before and after you change your bandage (dressing). If soap and water are not available, use hand sanitizer. ? Change your dressing as told by your health care provider. ? Leave stitches (sutures), skin glue, or adhesive strips in place. These skin closures may need to stay in place for 2 weeks or longer. If adhesive strip edges start to loosen and curl up, you may trim the loose edges. Do not remove adhesive strips completely unless your health care provider tells you to do that.  Check your port insertion site every day for signs of infection. Check for: ? Redness, swelling, or pain. ? Fluid or blood. ? Warmth. ? Pus or a bad smell.      Activity  Return to your normal activities as told by your health care provider. Ask your health care provider what activities are safe for you.  Do not  lift anything that is heavier than 10 lb (4.5 kg), or the limit that you are told, until your health care provider says that it is safe. General instructions  Take over-the-counter and prescription medicines only as told by your health care provider.  Do not take baths, swim, or use a hot tub until your health care provider approves. Ask your health care provider if you may take showers. You may only be allowed to take sponge baths.  Do not drive for 24 hours if you were given a sedative during your procedure.  Wear a medical alert bracelet in case of an emergency. This will tell any health care providers that you have a port.  Keep all follow-up visits as told by your health care provider. This is important. Contact a health care provider if:  You cannot flush your port with saline as directed, or you cannot draw blood from the port.  You have a fever or chills.  You have redness, swelling, or pain around your port insertion site.  You have fluid or blood coming from your port insertion site.  Your port insertion site feels warm to the touch.  You have pus or a bad smell coming from the port insertion site. Get help right away if:  You have chest pain or shortness of breath.  You have bleeding from your port that you cannot control. Summary  Take care of the port as told by your   health care provider. Keep the manufacturer's information card with you at all times.  Change your dressing as told by your health care provider.  Contact a health care provider if you have a fever or chills or if you have redness, swelling, or pain around your port insertion site.  Keep all follow-up visits as told by your health care provider. This information is not intended to replace advice given to you by your health care provider. Make sure you discuss any questions you have with your health care provider. Document Revised: 02/17/2018 Document Reviewed: 02/17/2018 Elsevier Patient Education   2021 Elsevier Inc.  

## 2020-09-26 ENCOUNTER — Encounter: Payer: Self-pay | Admitting: *Deleted

## 2020-09-26 ENCOUNTER — Other Ambulatory Visit: Payer: Self-pay | Admitting: Emergency Medicine

## 2020-09-26 ENCOUNTER — Other Ambulatory Visit: Payer: Self-pay | Admitting: Family

## 2020-09-26 DIAGNOSIS — E871 Hypo-osmolality and hyponatremia: Secondary | ICD-10-CM | POA: Diagnosis not present

## 2020-09-26 DIAGNOSIS — K5790 Diverticulosis of intestine, part unspecified, without perforation or abscess without bleeding: Secondary | ICD-10-CM | POA: Diagnosis present

## 2020-09-26 DIAGNOSIS — E876 Hypokalemia: Secondary | ICD-10-CM | POA: Diagnosis present

## 2020-09-26 DIAGNOSIS — E860 Dehydration: Secondary | ICD-10-CM | POA: Diagnosis present

## 2020-09-26 DIAGNOSIS — C787 Secondary malignant neoplasm of liver and intrahepatic bile duct: Secondary | ICD-10-CM | POA: Diagnosis not present

## 2020-09-26 DIAGNOSIS — C259 Malignant neoplasm of pancreas, unspecified: Secondary | ICD-10-CM

## 2020-09-26 DIAGNOSIS — R112 Nausea with vomiting, unspecified: Secondary | ICD-10-CM | POA: Diagnosis not present

## 2020-09-26 DIAGNOSIS — C9110 Chronic lymphocytic leukemia of B-cell type not having achieved remission: Secondary | ICD-10-CM | POA: Diagnosis not present

## 2020-09-26 DIAGNOSIS — R1110 Vomiting, unspecified: Secondary | ICD-10-CM | POA: Diagnosis not present

## 2020-09-26 DIAGNOSIS — A09 Infectious gastroenteritis and colitis, unspecified: Secondary | ICD-10-CM | POA: Diagnosis not present

## 2020-09-26 DIAGNOSIS — K625 Hemorrhage of anus and rectum: Secondary | ICD-10-CM | POA: Diagnosis not present

## 2020-09-26 DIAGNOSIS — K529 Noninfective gastroenteritis and colitis, unspecified: Secondary | ICD-10-CM | POA: Diagnosis not present

## 2020-09-26 DIAGNOSIS — D709 Neutropenia, unspecified: Secondary | ICD-10-CM | POA: Diagnosis present

## 2020-09-26 DIAGNOSIS — K5791 Diverticulosis of intestine, part unspecified, without perforation or abscess with bleeding: Secondary | ICD-10-CM | POA: Diagnosis not present

## 2020-09-26 DIAGNOSIS — D6481 Anemia due to antineoplastic chemotherapy: Secondary | ICD-10-CM | POA: Diagnosis present

## 2020-09-26 DIAGNOSIS — C7800 Secondary malignant neoplasm of unspecified lung: Secondary | ICD-10-CM

## 2020-09-26 DIAGNOSIS — K5289 Other specified noninfective gastroenteritis and colitis: Secondary | ICD-10-CM | POA: Diagnosis present

## 2020-09-26 DIAGNOSIS — T451X5A Adverse effect of antineoplastic and immunosuppressive drugs, initial encounter: Secondary | ICD-10-CM | POA: Diagnosis present

## 2020-09-26 DIAGNOSIS — D649 Anemia, unspecified: Secondary | ICD-10-CM | POA: Diagnosis not present

## 2020-09-26 DIAGNOSIS — K521 Toxic gastroenteritis and colitis: Secondary | ICD-10-CM | POA: Diagnosis not present

## 2020-09-26 NOTE — Progress Notes (Signed)
Received a call from the patient's husband to notify us of patient's admission to [facility]. After leaving the office yesterday, she tried to eat, however started to vomit again. Around 7p she mentioned to her husband that she was having bloody stools. At that time Jenny Reichmann called EMS and had her transported to the hospital.   He states at the hospital, the patient had a CT scan. He wonders if this can be used in place of the CT we have scheduled for 10/09/20. [place] and spoke to [place] in Medical Records. She will fax me the CT report. [name] also mentions that they are monitoring her blood counts, but haven't yet transfused her. Instructed [name] to call me when patient is discharged and at that time I can request medical records for the entire admission.   Obtained the CT scan report. Reviewed results with Dr Marin Olp. We can cancel the Abd/Pelvis portion of the CT scheduled on 3/7, however we still need a Chest CT. Message sent to radiology and insurance specialist to notify them of this request.   George Ina, and let him know that the 3/7 CT will just be of the chest, and patient DOES NOT need to drink the contrast for this appointment.   Oncology Nurse Navigator Documentation  Oncology Nurse Navigator Flowsheets 09/26/2020  Confirmed Diagnosis Date -  Diagnosis Status -  Phase of Treatment -  Chemotherapy Pending- Reason: -  Chemotherapy Actual Start Date: -  Navigator Follow Up Date: 10/09/2020  Navigator Follow Up Reason: Scan Review  Navigator Location [facility]  Navigator Encounter Type Telephone  Telephone Incoming Call  Treatment Initiated Date -  Patient Visit Type MedOnc  Treatment Phase Active Tx  Barriers/Navigation Needs Coordination of Care;Education  Education Other  Interventions Education;Psycho-Social Support  Acuity Level 2-Minimal Needs (1-2 Barriers Identified)  Coordination of Care Other  Education Method Verbal  Support Groups/Services  Friends and Family  Time Spent with Patient 16

## 2020-10-05 ENCOUNTER — Encounter: Payer: Self-pay | Admitting: *Deleted

## 2020-10-05 NOTE — Progress Notes (Signed)
Patient discharged from the hospital last Friday. She states since that time she has been extremely weak and has "no strength". She is worried about waiting until next Wednesday to be seen. She states she's eating and drinking well. She states her BMs are at baseline - slightly loose, and her abdominal pain continues, but hasn't gotten any worse.   Reviewed symptoms with Dr Marin Olp. He would like patient to come in and be seen tomorrow with lab work and possible symptom management.   Oncology Nurse Navigator Documentation  Oncology Nurse Navigator Flowsheets 10/05/2020  Confirmed Diagnosis Date -  Diagnosis Status -  Phase of Treatment -  Chemotherapy Pending- Reason: -  Chemotherapy Actual Start Date: -  Navigator Follow Up Date: 10/06/2020  Navigator Follow Up Reason: Symptom Management  Navigator Location [facility]  Navigator Encounter Type Telephone  Telephone Symptom Mgt;Incoming Call  Treatment Initiated Date -  Patient Visit Type MedOnc  Treatment Phase Active Tx  Barriers/Navigation Needs Coordination of Care;Education  Education Other  Interventions Coordination of Care;Psycho-Social Support  Acuity Level 2-Minimal Needs (1-2 Barriers Identified)  Coordination of Care Appts  Education Method Verbal  Support Groups/Services Friends and Family  Time Spent with Patient 20

## 2020-10-06 ENCOUNTER — Other Ambulatory Visit: Payer: Self-pay | Admitting: *Deleted

## 2020-10-06 ENCOUNTER — Inpatient Hospital Stay: Payer: Medicare Other

## 2020-10-06 ENCOUNTER — Inpatient Hospital Stay: Payer: Medicare Other | Attending: Hematology & Oncology

## 2020-10-06 ENCOUNTER — Other Ambulatory Visit: Payer: Self-pay

## 2020-10-06 ENCOUNTER — Encounter: Payer: Self-pay | Admitting: Family

## 2020-10-06 ENCOUNTER — Inpatient Hospital Stay (HOSPITAL_BASED_OUTPATIENT_CLINIC_OR_DEPARTMENT_OTHER): Payer: Medicare Other | Admitting: Family

## 2020-10-06 VITALS — BP 92/42 | HR 106 | Temp 97.7°F | Resp 20 | Ht 68.0 in | Wt 139.8 lb

## 2020-10-06 DIAGNOSIS — D508 Other iron deficiency anemias: Secondary | ICD-10-CM | POA: Diagnosis not present

## 2020-10-06 DIAGNOSIS — D649 Anemia, unspecified: Secondary | ICD-10-CM

## 2020-10-06 DIAGNOSIS — C7800 Secondary malignant neoplasm of unspecified lung: Secondary | ICD-10-CM

## 2020-10-06 DIAGNOSIS — C787 Secondary malignant neoplasm of liver and intrahepatic bile duct: Secondary | ICD-10-CM

## 2020-10-06 DIAGNOSIS — C259 Malignant neoplasm of pancreas, unspecified: Secondary | ICD-10-CM | POA: Diagnosis not present

## 2020-10-06 DIAGNOSIS — Z79899 Other long term (current) drug therapy: Secondary | ICD-10-CM | POA: Diagnosis not present

## 2020-10-06 DIAGNOSIS — C9110 Chronic lymphocytic leukemia of B-cell type not having achieved remission: Secondary | ICD-10-CM | POA: Insufficient documentation

## 2020-10-06 DIAGNOSIS — Z95828 Presence of other vascular implants and grafts: Secondary | ICD-10-CM

## 2020-10-06 LAB — CBC WITH DIFFERENTIAL (CANCER CENTER ONLY)
Abs Immature Granulocytes: 0.13 10*3/uL — ABNORMAL HIGH (ref 0.00–0.07)
Basophils Absolute: 0.1 10*3/uL (ref 0.0–0.1)
Basophils Relative: 0 %
Eosinophils Absolute: 0 10*3/uL (ref 0.0–0.5)
Eosinophils Relative: 0 %
HCT: 25.6 % — ABNORMAL LOW (ref 36.0–46.0)
Hemoglobin: 8.5 g/dL — ABNORMAL LOW (ref 12.0–15.0)
Immature Granulocytes: 1 %
Lymphocytes Relative: 71 %
Lymphs Abs: 12.8 10*3/uL — ABNORMAL HIGH (ref 0.7–4.0)
MCH: 31.4 pg (ref 26.0–34.0)
MCHC: 33.2 g/dL (ref 30.0–36.0)
MCV: 94.5 fL (ref 80.0–100.0)
Monocytes Absolute: 0.1 10*3/uL (ref 0.1–1.0)
Monocytes Relative: 0 %
Neutro Abs: 5 10*3/uL (ref 1.7–7.7)
Neutrophils Relative %: 28 %
Platelet Count: 273 10*3/uL (ref 150–400)
RBC: 2.71 MIL/uL — ABNORMAL LOW (ref 3.87–5.11)
RDW: 16.7 % — ABNORMAL HIGH (ref 11.5–15.5)
WBC Count: 18.1 10*3/uL — ABNORMAL HIGH (ref 4.0–10.5)
nRBC: 0 % (ref 0.0–0.2)

## 2020-10-06 LAB — CMP (CANCER CENTER ONLY)
ALT: 10 U/L (ref 0–44)
AST: 14 U/L — ABNORMAL LOW (ref 15–41)
Albumin: 3.4 g/dL — ABNORMAL LOW (ref 3.5–5.0)
Alkaline Phosphatase: 80 U/L (ref 38–126)
Anion gap: 8 (ref 5–15)
BUN: 9 mg/dL (ref 8–23)
CO2: 28 mmol/L (ref 22–32)
Calcium: 8.7 mg/dL — ABNORMAL LOW (ref 8.9–10.3)
Chloride: 96 mmol/L — ABNORMAL LOW (ref 98–111)
Creatinine: 0.64 mg/dL (ref 0.44–1.00)
GFR, Estimated: >60 mL/min (ref >60)
Glucose, Bld: 166 mg/dL — ABNORMAL HIGH (ref 70–99)
Potassium: 3.9 mmol/L (ref 3.5–5.1)
Sodium: 132 mmol/L — ABNORMAL LOW (ref 135–145)
Total Bilirubin: 0.7 mg/dL (ref 0.3–1.2)
Total Protein: 5.7 g/dL — ABNORMAL LOW (ref 6.5–8.1)

## 2020-10-06 LAB — LACTATE DEHYDROGENASE: LDH: 117 U/L (ref 98–192)

## 2020-10-06 LAB — PREPARE RBC (CROSSMATCH)

## 2020-10-06 MED ORDER — SODIUM CHLORIDE 0.9% FLUSH
10.0000 mL | Freq: Once | INTRAVENOUS | Status: AC
  Administered 2020-10-06: 10 mL via INTRAVENOUS
  Filled 2020-10-06: qty 10

## 2020-10-06 MED ORDER — HEPARIN SOD (PORK) LOCK FLUSH 100 UNIT/ML IV SOLN
500.0000 [IU] | Freq: Once | INTRAVENOUS | Status: AC
  Administered 2020-10-06: 500 [IU] via INTRAVENOUS
  Filled 2020-10-06: qty 5

## 2020-10-06 MED ORDER — SODIUM CHLORIDE 0.9 % IV SOLN
Freq: Once | INTRAVENOUS | Status: DC
  Filled 2020-10-06: qty 250

## 2020-10-06 MED ORDER — SODIUM CHLORIDE 0.9 % IV SOLN
Freq: Once | INTRAVENOUS | Status: AC
  Filled 2020-10-06: qty 250

## 2020-10-06 MED ORDER — OXYCODONE HCL 5 MG PO TABS: 5.0000 mg | ORAL_TABLET | ORAL | 0 refills | Status: DC | PRN

## 2020-10-06 MED ORDER — SODIUM CHLORIDE 0.9% FLUSH
10.0000 mL | INTRAVENOUS | Status: DC | PRN
  Administered 2020-10-06: 10 mL via INTRAVENOUS
  Filled 2020-10-06: qty 10

## 2020-10-06 NOTE — Patient Instructions (Signed)
Tunneled Central Venous Catheter Flushing Guide  It is important to flush your tunneled central venous catheter each time you use it, both before and after you use it. Flushing your catheter will help prevent it from clogging. What are the risks? Risks may include:  Infection.  Air getting into the catheter and bloodstream. Supplies needed:  A clean pair of gloves.  A disinfecting wipe. Use an alcohol wipe, chlorhexidine wipe, or iodine wipe as told by your health care provider.  A 10 mL syringe that has been prefilled with saline solution.  An empty 10 mL syringe, if a substance called heparin was injected into your catheter. How to flush your catheter When you flush your catheter, make sure you follow any specific instructions from your health care provider or the manufacturer. These are general guidelines. Flushing your catheter before use If there is heparin in your catheter: 1. Wash your hands with soap and water. 2. Put on gloves. 3. Scrub the injection cap for a minimum of 15 seconds with a disinfecting wipe. 4. Unclamp the catheter. 5. Attach the empty syringe to the injection cap. 6. Pull the syringe plunger back and withdraw 10 mL of blood. 7. Place the syringe into an appropriate waste container. 8. Scrub the injection cap for 15 seconds with a disinfecting wipe. 9. Attach the prefilled syringe to the injection cap. 10. Flush the catheter by pushing the plunger forward until all the liquid from the syringe is in the catheter. 11. Remove the syringe from the injection cap. 12. Clamp the catheter. If there is no heparin in your catheter: 1. Wash your hands with soap and water. 2. Put on gloves. 3. Scrub the injection cap for 15 seconds with a disinfecting wipe. 4. Unclamp the catheter. 5. Attach the prefilled syringe to the injection cap. 6. Flush the catheter by pushing the plunger forward until 5 mL of the liquid from the syringe is in the catheter. 7. Pull back on  the syringe until you see blood in the catheter. 8. If you have been asked to collect any blood, follow your health care provider's instructions. Otherwise, flush the catheter with the rest of the solution from the syringe. 9. Remove the syringe from the injection cap. 10. Clamp the catheter.   Flushing your catheter after use 1. Wash your hands with soap and water. 2. Put on gloves. 3. Scrub the injection cap for 15 seconds with a disinfecting wipe. 4. Unclamp the catheter. 5. Attach the prefilled syringe to the injection cap. 6. Flush the catheter by pushing the plunger forward until all of the liquid from the syringe is in the catheter. 7. Remove the syringe from the injection cap. 8. Clamp the catheter. Problems and solutions  If blood cannot be completely cleared from the injection cap, you may need to have the injection cap replaced.  If the catheter is difficult to flush, use the pulsing method. The pulsing method involves pushing only a few milliliters of solution into the catheter at a time and pausing between pushes.  If you do not see blood in the catheter when you pull back on the syringe, change your body position, such as by raising your arms above your head. Take a deep breath and cough. Then, pull back on the syringe. If you still do not see blood, flush the catheter with a small amount of solution. Then, change positions again and take a breath or cough. Pull back on the syringe again. If you still do not   see blood, finish flushing the catheter and contact your health care provider. Do not use your catheter until your health care provider says it is okay. General tips  Have someone help you flush your catheter, if possible.  Do not force fluid through your catheter.  Do not use a syringe that is larger or smaller than 10 mL. Using a smaller syringe can make the catheter burst.  Do not use your catheter without flushing it first if it has heparin in it. Contact a health  care provider if:  You cannot see any blood in the catheter when you flush it before using it.  Your catheter is difficult to flush. Get help right away if:  You cannot flush the catheter.  The catheter leaks when you flush it or when there is fluid in it.  There are cracks or breaks in the catheter. Summary  It is important to flush your tunneled central venous catheter each time you use it, both before and after you use it.  Scrub the injection cap for 15 seconds with a disinfecting wipe before and after you flush it.  When you flush your catheter, make sure you follow any specific instructions from your health care provider or the manufacturer.  Get help right away if you cannot flush the catheter. This information is not intended to replace advice given to you by your health care provider. Make sure you discuss any questions you have with your health care provider. Document Revised: 09/30/2019 Document Reviewed: 10/07/2018 Elsevier Patient Education  2021 Elsevier Inc.  

## 2020-10-06 NOTE — Progress Notes (Signed)
Hematology and Oncology Follow Up Visit  Tamara Powell [id number] 1946-01-08 75 y.o. 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 She was hospitalized last week at [facility] for colitis and was treated with fluids and antibiotic therapy. She was discharged earlier this week and is slowly recuperating.  She is still feeling fatigued and weak.  Hgb is 8.5, MCV 94, WBC count is 18.1 and platelets 273.  No blood loss noted. No bruising or petechiae.  Her abdominal pain has resolved and the gas and bloating has improved.  She has been on a soft/bland food diet. Her BM's have been daily and soft. She denies diarrhea.  CA 19-9 last month was down to 4,115 (previously 6,564).  No fever, chills, n/v, cough, rash, dizziness, SOB, chest pain, palpitations, abdominal pain or changes in bladder habits.  No swelling, tenderness, numbness or tingling in her extremities at this time.  No falls or syncope. She is in a wheelchair today due to still feeling weak.  She is doing her best to stay well hydrated but her Chloride level is down at 96 today. Sodium is 132 and potassium 3.9.   ECOG Performance Status: 1 - Symptomatic but completely ambulatory  Medications:  Allergies as of 10/06/2020      Reactions   Alendronate Swelling   Ibandronic Acid Swelling   Latex Hives      Medication List       Accurate as of October 06, 2020 11:13 AM. If you have any questions, ask your nurse or doctor.        amoxicillin-clavulanate 875-125 MG tablet Commonly known as: AUGMENTIN Take by mouth.   b complex vitamins capsule Take 1 capsule by mouth daily.   calcium carbonate 600 MG Tabs tablet Commonly known as:  OS-CAL Take 600 mg by mouth 2 (two) times daily with a meal.   Cholecalciferol 25 MCG (1000 UT) tablet 1,000 Units daily.   co-enzyme Q-10 30 MG capsule Take 100 mg by mouth 2 (two) times daily.   COLLAGEN PO Take by mouth. One scoop of powder daily in coffee.   dexamethasone 4 MG tablet Commonly known as: DECADRON Take 2 tablets (8 mg total) by mouth daily. Start the day after chemotherapy for 3 days. Take with food.   dicyclomine 10 MG capsule Commonly known as: BENTYL Take 10 mg by mouth every 6 (six) hours as needed.   fish oil-omega-3 fatty acids 1000 MG capsule Take 1 g by mouth 2 (two) times daily.   Grapeseed Extract 500-50 MG Caps Take by mouth 2 (two) times daily.   GREEN TEA EXTRACT PO Take by mouth. Takes 3 caps daily.   lidocaine-prilocaine cream Commonly known as: EMLA Apply to affected area once   loperamide 2 MG tablet Commonly known as: Imodium A-D Take 2 at onset of diarrhea, then 1 every 2hrs until 12hr without a BM. May take 2 tab every 4hrs at bedtime. If diarrhea recurs repeat.   magic mouthwash w/lidocaine Soln Take 5 mLs by mouth 3 (three) times daily as needed for mouth pain.   multivitamin tablet Take 1 tablet by mouth daily.   nystatin 100000 UNIT/ML suspension Commonly known as: MYCOSTATIN Take  by mouth.   omeprazole 20 MG capsule Commonly known as: PRILOSEC Take 20 mg by mouth every morning.   ondansetron 4 MG disintegrating tablet Commonly known as: ZOFRAN-ODT Take 4 mg by mouth every 6 (six) hours as needed.   ondansetron 8 MG tablet Commonly known as: Zofran Take 1 tablet (8 mg total) by mouth 2 (two) times daily as needed. Start on day 3 after chemotherapy.   OVER THE COUNTER MEDICATION Green Vibrance daily.   OVER THE COUNTER MEDICATION Take by mouth 2 (two) times daily. WELLNESS FORMULA CAPSULE--HAIR SKIN NAILS CAPSULES.   oxyCODONE 5 MG immediate release tablet Commonly known as: Oxy IR/ROXICODONE Take 1 tablet (5  mg total) by mouth every 4 (four) hours as needed for severe pain.   oxyCODONE-acetaminophen 5-325 MG tablet Commonly known as: PERCOCET/ROXICET Take by mouth every 6 (six) hours as needed for severe pain.   prochlorperazine 10 MG tablet Commonly known as: COMPAZINE Take 1 tablet (10 mg total) by mouth every 6 (six) hours as needed (Nausea or vomiting).   Turmeric 450 MG Caps Take by mouth every morning.   vitamin E 180 MG (400 UNITS) capsule 400 Units daily.       Allergies:  Allergies  Allergen Reactions  . Alendronate Swelling  . Ibandronic Acid Swelling  . Latex Hives    Past Medical History, Surgical history, Social history, and Family History were reviewed and updated.  Review of Systems: All other 10 point review of systems is negative.   Physical Exam:  height is 5\' 8"  (1.727 m) and weight is 139 lb 12.8 oz (63.4 kg). Her oral temperature is 97.7 F (36.5 C). Her blood pressure is 92/42 (abnormal) and her pulse is 106 (abnormal). Her respiration is 20 and oxygen saturation is 100%.   Wt Readings from Last 3 Encounters:  10/06/20 139 lb 12.8 oz (63.4 kg)  09/20/20 140 lb (63.5 kg)  09/06/20 142 lb (64.4 kg)    Ocular: Sclerae unicteric, pupils equal, round and reactive to light Ear-nose-throat: Oropharynx clear, dentition fair Lymphatic: No cervical or supraclavicular adenopathy Lungs no rales or rhonchi, good excursion bilaterally Heart regular rate and rhythm, no murmur appreciated Abd soft, nontender, positive bowel sounds MSK no focal spinal tenderness, no joint edema Neuro: non-focal, well-oriented, appropriate affect Breasts: Deferred   Lab Results  Component Value Date   WBC 26.1 (H) 09/25/2020   HGB 10.5 (L) 09/25/2020   HCT 31.7 (L) 09/25/2020   MCV 93.5 09/25/2020   PLT 206 09/25/2020   Lab Results  Component Value Date   FERRITIN 30 01/11/2019   IRON 49 01/11/2019   TIBC 276 01/11/2019   UIBC 227 01/11/2019   IRONPCTSAT 18 (L)  01/11/2019   Lab Results  Component Value Date   RETICCTPCT 1.5 12/17/2010   RBC 3.39 (L) 09/25/2020   RETICCTABS 58.2 12/17/2010   No results found for: KPAFRELGTCHN, LAMBDASER, KAPLAMBRATIO Lab Results  Component Value Date   IGGSERUM 600 (L) 05/02/2014   IGA 94 05/02/2014   IGMSERUM 26 (L) 05/02/2014   Lab Results  Component Value Date   TOTALPROTELP 6.1 12/17/2010   ALBUMINELP 66.1 12/17/2010   A1GS 3.6 12/17/2010   A2GS 8.8 12/17/2010   BETS 5.8 12/17/2010   BETA2SER 3.9 12/17/2010   GAMS 11.8 12/17/2010   MSPIKE NOT DET 12/17/2010   SPEI * 12/17/2010     Chemistry      Component Value Date/Time   NA 135 09/25/2020 1050   NA 139 11/11/2016  1426   NA 142 08/30/2015 0941   K 3.2 (L) 09/25/2020 1050   K 4.1 11/11/2016 1426   K 4.9 08/30/2015 0941   CL 101 09/25/2020 1050   CL 103 11/11/2016 1426   CO2 24 09/25/2020 1050   CO2 28 11/11/2016 1426   CO2 27 08/30/2015 0941   BUN 18 09/25/2020 1050   BUN 12 11/11/2016 1426   BUN 11.8 08/30/2015 0941   CREATININE 0.60 09/25/2020 1050   CREATININE 0.9 11/11/2016 1426   CREATININE 1.0 08/30/2015 0941      Component Value Date/Time   CALCIUM 8.8 (L) 09/25/2020 1050   CALCIUM 9.4 11/11/2016 1426   CALCIUM 9.5 08/30/2015 0941   ALKPHOS 92 09/25/2020 1050   ALKPHOS 65 11/11/2016 1426   ALKPHOS 62 08/30/2015 0941   AST 19 09/25/2020 1050   AST 19 08/30/2015 0941   ALT 14 09/25/2020 1050   ALT 20 11/11/2016 1426   ALT 13 08/30/2015 0941   BILITOT 0.9 09/25/2020 1050   BILITOT 1.25 (H) 08/30/2015 0941       Impression and Plan: Tamara Powell is a very pleasant 75 yo caucasian female with a long history of CLL. She now has stage IV pancreatic adenocarcinoma. She is now home from the hospital recuperating from a recent bout with colitis.  We will give her IV fluids today and plan to give 2 units of blood on Monday.  We are working to reschedule her CT scan on Monday for before or after her transfusion.  Oxycodone  was refilled.  She has a follow-up with Dr. Marin Olp next week on Wednesday to discuss her CT scan results and potentially pick back up on treatment.  She was encouraged to contact our office with any questions or concerns.   [name], NP 3/4/202211:13 AM

## 2020-10-07 LAB — CANCER ANTIGEN 19-9: CA 19-9: 3066 U/mL — ABNORMAL HIGH (ref 0–35)

## 2020-10-09 ENCOUNTER — Other Ambulatory Visit (HOSPITAL_BASED_OUTPATIENT_CLINIC_OR_DEPARTMENT_OTHER): Payer: Medicare Other

## 2020-10-09 ENCOUNTER — Other Ambulatory Visit: Payer: Self-pay

## 2020-10-09 ENCOUNTER — Inpatient Hospital Stay: Payer: Medicare Other

## 2020-10-09 ENCOUNTER — Encounter (HOSPITAL_BASED_OUTPATIENT_CLINIC_OR_DEPARTMENT_OTHER): Payer: Self-pay

## 2020-10-09 ENCOUNTER — Other Ambulatory Visit: Payer: Self-pay | Admitting: *Deleted

## 2020-10-09 ENCOUNTER — Ambulatory Visit (HOSPITAL_BASED_OUTPATIENT_CLINIC_OR_DEPARTMENT_OTHER): Payer: Medicare Other

## 2020-10-09 ENCOUNTER — Ambulatory Visit (HOSPITAL_BASED_OUTPATIENT_CLINIC_OR_DEPARTMENT_OTHER)
Admission: RE | Admit: 2020-10-09 | Discharge: 2020-10-09 | Disposition: A | Discharge status: Home / Self Care | Payer: Medicare Other | Source: Ambulatory Visit | Attending: Family | Admitting: Family

## 2020-10-09 DIAGNOSIS — J984 Other disorders of lung: Secondary | ICD-10-CM | POA: Diagnosis not present

## 2020-10-09 DIAGNOSIS — D649 Anemia, unspecified: Secondary | ICD-10-CM

## 2020-10-09 DIAGNOSIS — C7800 Secondary malignant neoplasm of unspecified lung: Secondary | ICD-10-CM | POA: Diagnosis not present

## 2020-10-09 DIAGNOSIS — C787 Secondary malignant neoplasm of liver and intrahepatic bile duct: Secondary | ICD-10-CM | POA: Insufficient documentation

## 2020-10-09 DIAGNOSIS — I700 Atherosclerosis of aorta: Secondary | ICD-10-CM | POA: Diagnosis not present

## 2020-10-09 DIAGNOSIS — C259 Malignant neoplasm of pancreas, unspecified: Secondary | ICD-10-CM

## 2020-10-09 DIAGNOSIS — D1809 Hemangioma of other sites: Secondary | ICD-10-CM | POA: Diagnosis not present

## 2020-10-09 LAB — ABO/RH: ABO/RH(D): O POS

## 2020-10-09 MED ORDER — IOHEXOL 300 MG/ML  SOLN
100.0000 mL | Freq: Once | INTRAMUSCULAR | Status: AC | PRN
  Administered 2020-10-09: 80 mL via INTRAVENOUS

## 2020-10-09 MED ORDER — DIPHENHYDRAMINE HCL 25 MG PO CAPS
ORAL_CAPSULE | ORAL | Status: AC
  Filled 2020-10-09: qty 2

## 2020-10-09 MED ORDER — SODIUM CHLORIDE 0.9% IV SOLUTION
250.0000 mL | Freq: Once | INTRAVENOUS | Status: AC
  Administered 2020-10-09: 250 mL via INTRAVENOUS
  Filled 2020-10-09: qty 250

## 2020-10-09 MED ORDER — ACETAMINOPHEN 325 MG PO TABS
ORAL_TABLET | ORAL | Status: AC
  Filled 2020-10-09: qty 2

## 2020-10-09 MED ORDER — SODIUM CHLORIDE 0.9% FLUSH
10.0000 mL | INTRAVENOUS | Status: DC | PRN
  Administered 2020-10-09: 10 mL via INTRAVENOUS
  Filled 2020-10-09: qty 10

## 2020-10-09 MED ORDER — HEPARIN SOD (PORK) LOCK FLUSH 100 UNIT/ML IV SOLN
500.0000 [IU] | Freq: Every day | INTRAVENOUS | Status: AC | PRN
  Administered 2020-10-09: 500 [IU]
  Filled 2020-10-09: qty 5

## 2020-10-09 MED ORDER — DIPHENHYDRAMINE HCL 25 MG PO CAPS
25.0000 mg | ORAL_CAPSULE | Freq: Once | ORAL | Status: AC
  Administered 2020-10-09: 25 mg via ORAL

## 2020-10-09 MED ORDER — HEPARIN SOD (PORK) LOCK FLUSH 100 UNIT/ML IV SOLN
250.0000 [IU] | INTRAVENOUS | Status: DC | PRN
  Filled 2020-10-09: qty 5

## 2020-10-09 MED ORDER — ACETAMINOPHEN 325 MG PO TABS
650.0000 mg | ORAL_TABLET | Freq: Once | ORAL | Status: AC
  Administered 2020-10-09: 650 mg via ORAL

## 2020-10-09 NOTE — Patient Instructions (Addendum)

## 2020-10-09 NOTE — Patient Instructions (Signed)
Implanted Port Insertion, Care After This sheet gives you information about how to care for yourself after your procedure. Your health care provider may also give you more specific instructions. If you have problems or questions, contact your health care provider. What can I expect after the procedure? After the procedure, it is common to have:  Discomfort at the port insertion site.  Bruising on the skin over the port. This should improve over 3-4 days. Follow these instructions at home: Port care  After your port is placed, you will get a manufacturer's information card. The card has information about your port. Keep this card with you at all times.  Take care of the port as told by your health care provider. Ask your health care provider if you or a family member can get training for taking care of the port at home. A home health care nurse may also take care of the port.  Make sure to remember what type of port you have. Incision care  Follow instructions from your health care provider about how to take care of your port insertion site. Make sure you: ? Wash your hands with soap and water before and after you change your bandage (dressing). If soap and water are not available, use hand sanitizer. ? Change your dressing as told by your health care provider. ? Leave stitches (sutures), skin glue, or adhesive strips in place. These skin closures may need to stay in place for 2 weeks or longer. If adhesive strip edges start to loosen and curl up, you may trim the loose edges. Do not remove adhesive strips completely unless your health care provider tells you to do that.  Check your port insertion site every day for signs of infection. Check for: ? Redness, swelling, or pain. ? Fluid or blood. ? Warmth. ? Pus or a bad smell.      Activity  Return to your normal activities as told by your health care provider. Ask your health care provider what activities are safe for you.  Do not  lift anything that is heavier than 10 lb (4.5 kg), or the limit that you are told, until your health care provider says that it is safe. General instructions  Take over-the-counter and prescription medicines only as told by your health care provider.  Do not take baths, swim, or use a hot tub until your health care provider approves. Ask your health care provider if you may take showers. You may only be allowed to take sponge baths.  Do not drive for 24 hours if you were given a sedative during your procedure.  Wear a medical alert bracelet in case of an emergency. This will tell any health care providers that you have a port.  Keep all follow-up visits as told by your health care provider. This is important. Contact a health care provider if:  You cannot flush your port with saline as directed, or you cannot draw blood from the port.  You have a fever or chills.  You have redness, swelling, or pain around your port insertion site.  You have fluid or blood coming from your port insertion site.  Your port insertion site feels warm to the touch.  You have pus or a bad smell coming from the port insertion site. Get help right away if:  You have chest pain or shortness of breath.  You have bleeding from your port that you cannot control. Summary  Take care of the port as told by your   health care provider. Keep the manufacturer's information card with you at all times.  Change your dressing as told by your health care provider.  Contact a health care provider if you have a fever or chills or if you have redness, swelling, or pain around your port insertion site.  Keep all follow-up visits as told by your health care provider. This information is not intended to replace advice given to you by your health care provider. Make sure you discuss any questions you have with your health care provider. Document Revised: 02/17/2018 Document Reviewed: 02/17/2018 Elsevier Patient Education   2021 Elsevier Inc.  

## 2020-10-10 ENCOUNTER — Encounter: Payer: Self-pay | Admitting: *Deleted

## 2020-10-10 LAB — BPAM RBC
Blood Product Expiration Date: 202204012359
Blood Product Expiration Date: 202204012359
ISSUE DATE / TIME: 202203071039
ISSUE DATE / TIME: 202203071039
Unit Type and Rh: 5100
Unit Type and Rh: 5100

## 2020-10-10 LAB — TYPE AND SCREEN
ABO/RH(D): O POS
Antibody Screen: NEGATIVE
Unit division: 0
Unit division: 0

## 2020-10-10 NOTE — Progress Notes (Signed)
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

## 2020-10-11 ENCOUNTER — Encounter: Payer: Self-pay | Admitting: Hematology & Oncology

## 2020-10-11 ENCOUNTER — Encounter: Payer: Self-pay | Admitting: *Deleted

## 2020-10-11 ENCOUNTER — Inpatient Hospital Stay: Payer: Medicare Other

## 2020-10-11 ENCOUNTER — Other Ambulatory Visit: Payer: Self-pay | Admitting: Hematology & Oncology

## 2020-10-11 ENCOUNTER — Other Ambulatory Visit: Payer: Self-pay

## 2020-10-11 ENCOUNTER — Inpatient Hospital Stay (HOSPITAL_BASED_OUTPATIENT_CLINIC_OR_DEPARTMENT_OTHER): Payer: Medicare Other | Admitting: Hematology & Oncology

## 2020-10-11 VITALS — BP 110/71 | HR 105 | Temp 97.8°F | Resp 18 | Wt 137.0 lb

## 2020-10-11 DIAGNOSIS — Z95828 Presence of other vascular implants and grafts: Secondary | ICD-10-CM

## 2020-10-11 DIAGNOSIS — D508 Other iron deficiency anemias: Secondary | ICD-10-CM | POA: Diagnosis not present

## 2020-10-11 DIAGNOSIS — C787 Secondary malignant neoplasm of liver and intrahepatic bile duct: Secondary | ICD-10-CM

## 2020-10-11 DIAGNOSIS — R109 Unspecified abdominal pain: Secondary | ICD-10-CM | POA: Diagnosis not present

## 2020-10-11 DIAGNOSIS — Z66 Do not resuscitate: Secondary | ICD-10-CM | POA: Diagnosis not present

## 2020-10-11 DIAGNOSIS — R1110 Vomiting, unspecified: Secondary | ICD-10-CM | POA: Diagnosis not present

## 2020-10-11 DIAGNOSIS — K56609 Unspecified intestinal obstruction, unspecified as to partial versus complete obstruction: Secondary | ICD-10-CM | POA: Diagnosis not present

## 2020-10-11 DIAGNOSIS — C7800 Secondary malignant neoplasm of unspecified lung: Secondary | ICD-10-CM | POA: Diagnosis not present

## 2020-10-11 DIAGNOSIS — C786 Secondary malignant neoplasm of retroperitoneum and peritoneum: Secondary | ICD-10-CM | POA: Diagnosis not present

## 2020-10-11 DIAGNOSIS — C9110 Chronic lymphocytic leukemia of B-cell type not having achieved remission: Secondary | ICD-10-CM | POA: Diagnosis not present

## 2020-10-11 DIAGNOSIS — K56699 Other intestinal obstruction unspecified as to partial versus complete obstruction: Secondary | ICD-10-CM | POA: Diagnosis not present

## 2020-10-11 DIAGNOSIS — C259 Malignant neoplasm of pancreas, unspecified: Secondary | ICD-10-CM | POA: Diagnosis not present

## 2020-10-11 DIAGNOSIS — Z515 Encounter for palliative care: Secondary | ICD-10-CM | POA: Diagnosis not present

## 2020-10-11 DIAGNOSIS — Z4682 Encounter for fitting and adjustment of non-vascular catheter: Secondary | ICD-10-CM | POA: Diagnosis not present

## 2020-10-11 DIAGNOSIS — Z20822 Contact with and (suspected) exposure to covid-19: Secondary | ICD-10-CM | POA: Diagnosis not present

## 2020-10-11 DIAGNOSIS — R000 Tachycardia, unspecified: Secondary | ICD-10-CM | POA: Diagnosis not present

## 2020-10-11 DIAGNOSIS — Z79899 Other long term (current) drug therapy: Secondary | ICD-10-CM | POA: Diagnosis not present

## 2020-10-11 LAB — CBC WITH DIFFERENTIAL (CANCER CENTER ONLY)
Abs Immature Granulocytes: 0.06 10*3/uL (ref 0.00–0.07)
Basophils Absolute: 0 10*3/uL (ref 0.0–0.1)
Basophils Relative: 0 %
Eosinophils Absolute: 0.1 10*3/uL (ref 0.0–0.5)
Eosinophils Relative: 1 %
HCT: 34.8 % — ABNORMAL LOW (ref 36.0–46.0)
Hemoglobin: 11.5 g/dL — ABNORMAL LOW (ref 12.0–15.0)
Immature Granulocytes: 0 %
Lymphocytes Relative: 75 %
Lymphs Abs: 11.9 10*3/uL — ABNORMAL HIGH (ref 0.7–4.0)
MCH: 30.6 pg (ref 26.0–34.0)
MCHC: 33 g/dL (ref 30.0–36.0)
MCV: 92.6 fL (ref 80.0–100.0)
Monocytes Absolute: 0.1 10*3/uL (ref 0.1–1.0)
Monocytes Relative: 1 %
Neutro Abs: 3.6 10*3/uL (ref 1.7–7.7)
Neutrophils Relative %: 23 %
Platelet Count: 319 10*3/uL (ref 150–400)
RBC: 3.76 MIL/uL — ABNORMAL LOW (ref 3.87–5.11)
RDW: 18 % — ABNORMAL HIGH (ref 11.5–15.5)
WBC Count: 15.8 10*3/uL — ABNORMAL HIGH (ref 4.0–10.5)
nRBC: 0 % (ref 0.0–0.2)

## 2020-10-11 LAB — CMP (CANCER CENTER ONLY)
ALT: 12 U/L (ref 0–44)
AST: 16 U/L (ref 15–41)
Albumin: 3.6 g/dL (ref 3.5–5.0)
Alkaline Phosphatase: 113 U/L (ref 38–126)
Anion gap: 10 (ref 5–15)
BUN: 7 mg/dL — ABNORMAL LOW (ref 8–23)
CO2: 24 mmol/L (ref 22–32)
Calcium: 9 mg/dL (ref 8.9–10.3)
Chloride: 100 mmol/L (ref 98–111)
Creatinine: 0.69 mg/dL (ref 0.44–1.00)
GFR, Estimated: >60 mL/min (ref >60)
Glucose, Bld: 145 mg/dL — ABNORMAL HIGH (ref 70–99)
Potassium: 4.1 mmol/L (ref 3.5–5.1)
Sodium: 134 mmol/L — ABNORMAL LOW (ref 135–145)
Total Bilirubin: 0.6 mg/dL (ref 0.3–1.2)
Total Protein: 6.1 g/dL — ABNORMAL LOW (ref 6.5–8.1)

## 2020-10-11 LAB — LACTATE DEHYDROGENASE: LDH: 116 U/L (ref 98–192)

## 2020-10-11 LAB — SAVE SMEAR(SSMR), FOR PROVIDER SLIDE REVIEW

## 2020-10-11 MED ORDER — HEPARIN SOD (PORK) LOCK FLUSH 100 UNIT/ML IV SOLN
500.0000 [IU] | Freq: Once | INTRAVENOUS | Status: AC
  Administered 2020-10-11: 500 [IU] via INTRAVENOUS
  Filled 2020-10-11: qty 5

## 2020-10-11 MED ORDER — SODIUM CHLORIDE 0.9% FLUSH
10.0000 mL | Freq: Once | INTRAVENOUS | Status: AC
  Administered 2020-10-11: 10 mL via INTRAVENOUS
  Filled 2020-10-11: qty 10

## 2020-10-11 NOTE — Patient Instructions (Signed)

## 2020-10-11 NOTE — Progress Notes (Signed)
Hematology and Oncology Follow Up Visit  Tamara Powell [id number] 1945/10/22 75 y.o. 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  She really had a tough time after her last cycle of treatment.  She was hospitalized down in [place].  She had terrible colitis.  She was told this was some that she ate.  She required a lot of IV fluids.  She required IV antibiotics.  It did not sound like this was Clostridium.  She I think had a CT scan done down there.  Her CA 19-9 has improved.  Close have not rechecked just last week it was 3100.  I really think we can have to make a change in her protocol.  I know she has had a tough time with the FOLFIRINOX.  I just do not want to make her life miserable.  I want to be able to have a decent quality of life and be able to function and enjoy her family.  I think that the Gemzar/Abraxane protocol would be a good idea.  I think that she would be able to tolerate this.  I think it would be able to continue to treat her cancer.  She is doing a little better.  She did receive blood last week.  She does not have any abdominal pain.  She said that she is not having any diarrhea.  There is no obvious bleeding.  Again, I want to make sure that we focus on her quality of life.  Having the CLL*is not helping her immune system.  We may have to think about utilizing IVIG.  Her immunoglobulin levels will have to be checked.  Currently, her performance status is ECOG 1.  Medications:  Allergies as of 10/11/2020      Reactions   Alendronate Swelling   Ibandronic Acid Swelling   Latex Hives      Medication List       Accurate as of October 11, 2020  5:34  PM. If you have any questions, ask your nurse or doctor.        STOP taking these medications   dexamethasone 4 MG tablet Commonly known as: DECADRON Stopped by: [name], MD   lidocaine-prilocaine cream Commonly known as: EMLA Stopped by: [name], MD   loperamide 2 MG tablet Commonly known as: Imodium A-D Stopped by: [name], MD   ondansetron 8 MG tablet Commonly known as: Zofran Stopped by: [name], MD   prochlorperazine 10 MG tablet Commonly known as: COMPAZINE Stopped by: [name], MD     TAKE these medications   amoxicillin-clavulanate 875-125 MG tablet Commonly known as: AUGMENTIN Take by mouth.   b complex vitamins capsule Take 1 capsule by mouth daily.   calcium carbonate 600 MG Tabs tablet Commonly known as: OS-CAL Take 600 mg by mouth 2 (two) times daily with a meal.   Cholecalciferol 25 MCG (1000 UT) tablet 1,000 Units daily.   co-enzyme Q-10 30 MG capsule Take 100 mg by mouth 2 (two) times daily.   COLLAGEN PO Take by mouth. One scoop of powder daily in coffee.   dicyclomine 10 MG capsule Commonly known  as: BENTYL Take 10 mg by mouth every 6 (six) hours as needed.   fish oil-omega-3 fatty acids 1000 MG capsule Take 1 g by mouth 2 (two) times daily.   Grapeseed Extract 500-50 MG Caps Take by mouth 2 (two) times daily.   GREEN TEA EXTRACT PO Take by mouth. Takes 3 caps daily.   magic mouthwash w/lidocaine Soln Take 5 mLs by mouth 3 (three) times daily as needed for mouth pain.   multivitamin tablet Take 1 tablet by mouth daily.   nystatin 100000 UNIT/ML suspension Commonly known as: MYCOSTATIN Take by mouth.   omeprazole 20 MG capsule Commonly known as: PRILOSEC Take 20 mg by mouth every morning.   ondansetron 4 MG disintegrating tablet Commonly known as: ZOFRAN-ODT Take 4 mg by mouth every 6 (six) hours as needed.   OVER THE COUNTER MEDICATION Green Vibrance daily.   OVER THE COUNTER  MEDICATION Take by mouth 2 (two) times daily. WELLNESS FORMULA CAPSULE--HAIR SKIN NAILS CAPSULES.   oxyCODONE 5 MG immediate release tablet Commonly known as: Oxy IR/ROXICODONE Take 1 tablet (5 mg total) by mouth every 4 (four) hours as needed for severe pain.   oxyCODONE-acetaminophen 5-325 MG tablet Commonly known as: PERCOCET/ROXICET Take by mouth every 6 (six) hours as needed for severe pain.   Turmeric 450 MG Caps Take by mouth every morning.   vitamin E 180 MG (400 UNITS) capsule 400 Units daily.       Allergies:  Allergies  Allergen Reactions  . Alendronate Swelling  . Ibandronic Acid Swelling  . Latex Hives    Past Medical History, Surgical history, Social history, and Family History were reviewed and updated.  Review of Systems: Review of Systems  Constitutional: Positive for malaise/fatigue and weight loss.  HENT: Negative.   Eyes: Negative.   Respiratory: Negative.   Cardiovascular: Negative.   Gastrointestinal: Positive for abdominal pain, diarrhea and vomiting.  Genitourinary: Negative.   Musculoskeletal: Negative.   Skin: Negative.   Neurological: Negative.   Endo/Heme/Allergies: Negative.   Psychiatric/Behavioral: Negative.      Physical Exam:  weight is 137 lb (62.1 kg). Her oral temperature is 97.8 F (36.6 C). Her blood pressure is 110/71 and her pulse is 105 (abnormal). Her respiration is 18 and oxygen saturation is 100%.   Wt Readings from Last 3 Encounters:  10/11/20 137 lb (62.1 kg)  10/06/20 139 lb 12.8 oz (63.4 kg)  09/20/20 140 lb (63.5 kg)    Physical Exam Vitals reviewed.  HENT:     Head: Normocephalic and atraumatic.     Mouth/Throat:     Mouth: Oropharynx is clear and moist.  Eyes:     Extraocular Movements: EOM normal.     Pupils: Pupils are equal, round, and reactive to light.  Cardiovascular:     Rate and Rhythm: Normal rate and regular rhythm.     Heart sounds: Normal heart sounds.  Pulmonary:     Effort: Pulmonary  effort is normal.     Breath sounds: Normal breath sounds.  Abdominal:     General: Bowel sounds are normal.     Palpations: Abdomen is soft.  Musculoskeletal:        General: No tenderness, deformity or edema. Normal range of motion.     Cervical back: Normal range of motion.  Lymphadenopathy:     Cervical: No cervical adenopathy.  Skin:    General: Skin is warm and dry.     Findings: No erythema or rash.  Neurological:  Mental Status: She is alert and oriented to person, place, and time.  Psychiatric:        Mood and Affect: Mood and affect normal.        Behavior: Behavior normal.        Thought Content: Thought content normal.        Judgment: Judgment normal.     Lab Results  Component Value Date   WBC 15.8 (H) 10/11/2020   HGB 11.5 (L) 10/11/2020   HCT 34.8 (L) 10/11/2020   MCV 92.6 10/11/2020   PLT 319 10/11/2020   Lab Results  Component Value Date   FERRITIN 30 01/11/2019   IRON 49 01/11/2019   TIBC 276 01/11/2019   UIBC 227 01/11/2019   IRONPCTSAT 18 (L) 01/11/2019   Lab Results  Component Value Date   RETICCTPCT 1.5 12/17/2010   RBC 3.76 (L) 10/11/2020   RETICCTABS 58.2 12/17/2010   No results found for: Nils Pyle, [facility] Lab Results  Component Value Date   IGGSERUM 600 (L) 05/02/2014   IGA 94 05/02/2014   IGMSERUM 26 (L) 05/02/2014   Lab Results  Component Value Date   TOTALPROTELP 6.1 12/17/2010   ALBUMINELP 66.1 12/17/2010   A1GS 3.6 12/17/2010   A2GS 8.8 12/17/2010   BETS 5.8 12/17/2010   BETA2SER 3.9 12/17/2010   GAMS 11.8 12/17/2010   MSPIKE NOT DET 12/17/2010   SPEI * 12/17/2010     Chemistry      Component Value Date/Time   NA 134 (L) 10/11/2020 0941   NA 139 11/11/2016 1426   NA 142 08/30/2015 0941   K 4.1 10/11/2020 0941   K 4.1 11/11/2016 1426   K 4.9 08/30/2015 0941   CL 100 10/11/2020 0941   CL 103 11/11/2016 1426   CO2 24 10/11/2020 0941   CO2 28 11/11/2016 1426   CO2 27 08/30/2015 0941   BUN  7 (L) 10/11/2020 0941   BUN 12 11/11/2016 1426   BUN 11.8 08/30/2015 0941   CREATININE 0.69 10/11/2020 0941   CREATININE 0.9 11/11/2016 1426   CREATININE 1.0 08/30/2015 0941      Component Value Date/Time   CALCIUM 9.0 10/11/2020 0941   CALCIUM 9.4 11/11/2016 1426   CALCIUM 9.5 08/30/2015 0941   ALKPHOS 113 10/11/2020 0941   ALKPHOS 65 11/11/2016 1426   ALKPHOS 62 08/30/2015 0941   AST 16 10/11/2020 0941   AST 19 08/30/2015 0941   ALT 12 10/11/2020 0941   ALT 20 11/11/2016 1426   ALT 13 08/30/2015 0941   BILITOT 0.6 10/11/2020 0941   BILITOT 1.25 (H) 08/30/2015 0941       Impression and Plan: Ms. Shankles is a very pleasant 75 yo caucasian female with a long history of CLL. She now has stage IV pancreatic adenocarcinoma.  Again, we are going to make a change in her protocol.  We will try the Abraxane/Gemzar.  I think this would be a good idea.  We will try to get started next week.  I think that she does need to have this week off from treatment.  I talked to her about the protocol.  I said that it may affect her blood counts.  Hopefully, she will not lose her hair but this is always a possibility.  It hopefully will not lead to diarrhea.  With the Gemzar, she could certainly have some temperatures after treatment.  We will go ahead with treatment.  Again she really is motivated.  She wants to be proactive.  We will have to keep in mind about her immune system because of the CLL.  We will check her immunoglobulin status.  The CA 19-9 is always a good way for [place] to assess how she is responding to treatment.  We will try to get started next week.  I will give her 2 weeks on and 1 week off.  I do not think she can manage 3 weeks on and 1 week off.  I spent a good 45 minutes with she and her husband today.  [name], MD 3/9/20225:34 PM

## 2020-10-11 NOTE — Addendum Note (Signed)
Addended by: Tyler Aas A on: 10/11/2020 11:56 AM   Modules accepted: Orders

## 2020-10-11 NOTE — Progress Notes (Signed)
DISCONTINUE ON PATHWAY REGIMEN - Pancreatic Adenocarcinoma     A cycle is every 14 days:     Oxaliplatin      Leucovorin      Irinotecan      Fluorouracil   **Always confirm dose/schedule in your pharmacy ordering system**  REASON: Toxicities / Adverse Event PRIOR TREATMENT: [id number]: mFOLFIRINOX q14 Days Until Progression or Toxicity TREATMENT RESPONSE: Partial Response (PR)  START ON PATHWAY REGIMEN - Pancreatic Adenocarcinoma     A cycle is every 28 days:     Nab-paclitaxel (protein bound)      Gemcitabine   **Always confirm dose/schedule in your pharmacy ordering system**  Patient Characteristics: Metastatic Disease, Second Line, MSS/pMMR or MSI Unknown, Fluoropyrimidine-Based Therapy First Line Therapeutic Status: Metastatic Disease Line of Therapy: Second Line Microsatellite/Mismatch Repair Status: MSS/pMMR Intent of Therapy: Non-Curative / Palliative Intent, Discussed with Patient 

## 2020-10-12 NOTE — Progress Notes (Signed)
Pharmacist Chemotherapy Monitoring - Initial Assessment    Anticipated start date: 10/18/20  Regimen:  . Are orders appropriate based on the patient's diagnosis, regimen, and cycle? Yes . Does the plan date match the patient's scheduled date? Yes . Is the sequencing of drugs appropriate? Yes . Are the premedications appropriate for the patient's regimen? Yes . Prior Authorization for treatment is: Approved o If applicable, is the correct biosimilar selected based on the patient's insurance? not applicable  Organ Function and Labs: Marland Kitchen Are dose adjustments needed based on the patient's renal function, hepatic function, or hematologic function? No . Are appropriate labs ordered prior to the start of patient's treatment? Yes . Other organ system assessment, if indicated: N/A . The following baseline labs, if indicated, have been ordered: N/A  Dose Assessment: . Are the drug doses appropriate? Yes . Are the following correct: o Drug concentrations Yes o IV fluid compatible with drug Yes o Administration routes Yes o Timing of therapy Yes . If applicable, does the patient have documented access for treatment and/or plans for port-a-cath placement? yes . If applicable, have lifetime cumulative doses been properly documented and assessed? not applicable Lifetime Dose Tracking  . Oxaliplatin: 272.74 mg/m2 (480 mg) = 45.46 % of the maximum lifetime dose of 600 mg/m2  o   Toxicity Monitoring/Prevention: . The patient has the following take home antiemetics prescribed: Ondansetron, Prochlorperazine, Dexamethasone and Lorazepam . The patient has the following take home medications prescribed: N/A . Medication allergies and previous infusion related reactions, if applicable, have been reviewed and addressed. Yes . The patient's current medication list has been assessed for drug-drug interactions with their chemotherapy regimen. no significant drug-drug interactions were identified on  review.  Order Review: . Are the treatment plan orders signed? Yes . Is the patient scheduled to see a provider prior to their treatment? No  I verify that I have reviewed each item in the above checklist and answered each question accordingly.  Claybon Jabs, [facility], 10/12/2020  3:22 PM

## 2020-10-12 NOTE — Progress Notes (Signed)
Patient feels better after her blood transfusion on Monday. She still feels weak however, and is not sure if she is ready to be treated today.  After seeing Dr Marin Olp, patient will change treatment regimens, starting next week.   Oncology Nurse Navigator Documentation  Oncology Nurse Navigator Flowsheets 10/11/2020  Confirmed Diagnosis Date -  Diagnosis Status -  Phase of Treatment -  Chemotherapy Pending- Reason: -  Chemotherapy Actual Start Date: -  Navigator Follow Up Date: 10/18/2020  Navigator Follow Up Reason: Chemotherapy  Navigator Location [facility]  Navigator Encounter Type Treatment;Appt/Treatment Plan Review  Telephone -  Treatment Initiated Date -  Patient Visit Type MedOnc  Treatment Phase Active Tx  Barriers/Navigation Needs Coordination of Care;Education  Education -  Interventions Psycho-Social Support;Coordination of Care  Acuity Level 2-Minimal Needs (1-2 Barriers Identified)  Coordination of Care Appts  Education Method -  Support Groups/Services Friends and Family  Time Spent with Patient 30

## 2020-10-13 ENCOUNTER — Encounter (HOSPITAL_BASED_OUTPATIENT_CLINIC_OR_DEPARTMENT_OTHER): Payer: Self-pay

## 2020-10-13 ENCOUNTER — Emergency Department (HOSPITAL_BASED_OUTPATIENT_CLINIC_OR_DEPARTMENT_OTHER): Payer: Medicare Other

## 2020-10-13 ENCOUNTER — Inpatient Hospital Stay: Payer: Medicare Other

## 2020-10-13 ENCOUNTER — Encounter: Payer: Self-pay | Admitting: *Deleted

## 2020-10-13 ENCOUNTER — Other Ambulatory Visit: Payer: Self-pay

## 2020-10-13 ENCOUNTER — Inpatient Hospital Stay (HOSPITAL_BASED_OUTPATIENT_CLINIC_OR_DEPARTMENT_OTHER)
Admission: EM | Admit: 2020-10-13 | Discharge: 2020-10-20 | DRG: 375 | Disposition: A | Discharge status: Hospice | Payer: Medicare Other | Attending: Internal Medicine | Admitting: Internal Medicine

## 2020-10-13 DIAGNOSIS — C9110 Chronic lymphocytic leukemia of B-cell type not having achieved remission: Secondary | ICD-10-CM | POA: Diagnosis present

## 2020-10-13 DIAGNOSIS — Z66 Do not resuscitate: Secondary | ICD-10-CM | POA: Diagnosis present

## 2020-10-13 DIAGNOSIS — E878 Other disorders of electrolyte and fluid balance, not elsewhere classified: Secondary | ICD-10-CM | POA: Diagnosis present

## 2020-10-13 DIAGNOSIS — E871 Hypo-osmolality and hyponatremia: Secondary | ICD-10-CM | POA: Diagnosis present

## 2020-10-13 DIAGNOSIS — C786 Secondary malignant neoplasm of retroperitoneum and peritoneum: Principal | ICD-10-CM | POA: Diagnosis present

## 2020-10-13 DIAGNOSIS — Z515 Encounter for palliative care: Secondary | ICD-10-CM

## 2020-10-13 DIAGNOSIS — Z85820 Personal history of malignant melanoma of skin: Secondary | ICD-10-CM

## 2020-10-13 DIAGNOSIS — R1110 Vomiting, unspecified: Secondary | ICD-10-CM | POA: Diagnosis not present

## 2020-10-13 DIAGNOSIS — K56609 Unspecified intestinal obstruction, unspecified as to partial versus complete obstruction: Secondary | ICD-10-CM | POA: Diagnosis present

## 2020-10-13 DIAGNOSIS — C259 Malignant neoplasm of pancreas, unspecified: Secondary | ICD-10-CM | POA: Diagnosis present

## 2020-10-13 DIAGNOSIS — Z20822 Contact with and (suspected) exposure to covid-19: Secondary | ICD-10-CM | POA: Diagnosis present

## 2020-10-13 DIAGNOSIS — K56699 Other intestinal obstruction unspecified as to partial versus complete obstruction: Secondary | ICD-10-CM | POA: Diagnosis not present

## 2020-10-13 DIAGNOSIS — R109 Unspecified abdominal pain: Secondary | ICD-10-CM | POA: Diagnosis not present

## 2020-10-13 DIAGNOSIS — Z9221 Personal history of antineoplastic chemotherapy: Secondary | ICD-10-CM

## 2020-10-13 DIAGNOSIS — D630 Anemia in neoplastic disease: Secondary | ICD-10-CM | POA: Diagnosis present

## 2020-10-13 DIAGNOSIS — Z0189 Encounter for other specified special examinations: Secondary | ICD-10-CM

## 2020-10-13 DIAGNOSIS — R000 Tachycardia, unspecified: Secondary | ICD-10-CM | POA: Diagnosis not present

## 2020-10-13 DIAGNOSIS — C787 Secondary malignant neoplasm of liver and intrahepatic bile duct: Secondary | ICD-10-CM | POA: Diagnosis present

## 2020-10-13 DIAGNOSIS — Z4682 Encounter for fitting and adjustment of non-vascular catheter: Secondary | ICD-10-CM | POA: Diagnosis not present

## 2020-10-13 DIAGNOSIS — C7800 Secondary malignant neoplasm of unspecified lung: Secondary | ICD-10-CM | POA: Diagnosis present

## 2020-10-13 LAB — COMPREHENSIVE METABOLIC PANEL
ALT: 14 U/L (ref 0–44)
AST: 18 U/L (ref 15–41)
Albumin: 3.1 g/dL — ABNORMAL LOW (ref 3.5–5.0)
Alkaline Phosphatase: 98 U/L (ref 38–126)
Anion gap: 13 (ref 5–15)
BUN: 10 mg/dL (ref 8–23)
CO2: 24 mmol/L (ref 22–32)
Calcium: 9 mg/dL (ref 8.9–10.3)
Chloride: 97 mmol/L — ABNORMAL LOW (ref 98–111)
Creatinine, Ser: 0.74 mg/dL (ref 0.44–1.00)
GFR, Estimated: >60 mL/min (ref >60)
Glucose, Bld: 123 mg/dL — ABNORMAL HIGH (ref 70–99)
Potassium: 4.2 mmol/L (ref 3.5–5.1)
Sodium: 134 mmol/L — ABNORMAL LOW (ref 135–145)
Total Bilirubin: 0.6 mg/dL (ref 0.3–1.2)
Total Protein: 6.3 g/dL — ABNORMAL LOW (ref 6.5–8.1)

## 2020-10-13 LAB — CBC WITH DIFFERENTIAL/PLATELET
Abs Immature Granulocytes: 0.03 10*3/uL (ref 0.00–0.07)
Basophils Absolute: 0.1 10*3/uL (ref 0.0–0.1)
Basophils Relative: 0 %
Eosinophils Absolute: 0.1 10*3/uL (ref 0.0–0.5)
Eosinophils Relative: 1 %
HCT: 36.5 % (ref 36.0–46.0)
Hemoglobin: 12.1 g/dL (ref 12.0–15.0)
Immature Granulocytes: 0 %
Lymphocytes Relative: 72 %
Lymphs Abs: 12.2 10*3/uL — ABNORMAL HIGH (ref 0.7–4.0)
MCH: 30.8 pg (ref 26.0–34.0)
MCHC: 33.2 g/dL (ref 30.0–36.0)
MCV: 92.9 fL (ref 80.0–100.0)
Monocytes Absolute: 0.4 10*3/uL (ref 0.1–1.0)
Monocytes Relative: 3 %
Neutro Abs: 4 10*3/uL (ref 1.7–7.7)
Neutrophils Relative %: 24 %
Platelets: 385 10*3/uL (ref 150–400)
RBC: 3.93 MIL/uL (ref 3.87–5.11)
RDW: 17.2 % — ABNORMAL HIGH (ref 11.5–15.5)
Smear Review: NORMAL
WBC Morphology: ABNORMAL
WBC: 16.8 10*3/uL — ABNORMAL HIGH (ref 4.0–10.5)
nRBC: 0 % (ref 0.0–0.2)

## 2020-10-13 LAB — RESP PANEL BY RT-PCR (FLU A&B, COVID) ARPGX2
Influenza A by PCR: NEGATIVE
Influenza B by PCR: NEGATIVE
SARS Coronavirus 2 by RT PCR: NEGATIVE

## 2020-10-13 LAB — LIPASE, BLOOD: Lipase: 33 U/L (ref 11–51)

## 2020-10-13 LAB — LACTIC ACID, PLASMA: Lactic Acid, Venous: 0.5 mmol/L (ref 0.5–1.9)

## 2020-10-13 MED ORDER — LACTATED RINGERS IV SOLN: INTRAVENOUS | Status: DC

## 2020-10-13 MED ORDER — MORPHINE SULFATE (PF) 2 MG/ML IV SOLN
2.0000 mg | INTRAVENOUS | Status: DC | PRN
  Administered 2020-10-13 – 2020-10-20 (×13): 2 mg via INTRAVENOUS
  Filled 2020-10-13 (×14): qty 1

## 2020-10-13 MED ORDER — MORPHINE SULFATE (PF) 4 MG/ML IV SOLN
4.0000 mg | Freq: Once | INTRAVENOUS | Status: AC
  Administered 2020-10-13: 4 mg via INTRAVENOUS
  Filled 2020-10-13: qty 1

## 2020-10-13 MED ORDER — ONDANSETRON HCL 4 MG/2ML IJ SOLN
4.0000 mg | Freq: Four times a day (QID) | INTRAMUSCULAR | Status: DC | PRN
  Administered 2020-10-19: 4 mg via INTRAVENOUS
  Filled 2020-10-13 (×4): qty 2

## 2020-10-13 MED ORDER — ONDANSETRON HCL 4 MG/2ML IJ SOLN
4.0000 mg | Freq: Once | INTRAMUSCULAR | Status: AC
  Administered 2020-10-13: 4 mg via INTRAVENOUS
  Filled 2020-10-13: qty 2

## 2020-10-13 MED ORDER — IOHEXOL 300 MG/ML  SOLN
100.0000 mL | Freq: Once | INTRAMUSCULAR | Status: AC | PRN
  Administered 2020-10-13: 100 mL via INTRAVENOUS

## 2020-10-13 MED ORDER — METOCLOPRAMIDE HCL 5 MG/ML IJ SOLN
10.0000 mg | Freq: Once | INTRAMUSCULAR | Status: AC
  Administered 2020-10-13: 10 mg via INTRAVENOUS
  Filled 2020-10-13: qty 2

## 2020-10-13 MED ORDER — LACTATED RINGERS IV BOLUS
1000.0000 mL | Freq: Once | INTRAVENOUS | Status: AC
  Administered 2020-10-13: 1000 mL via INTRAVENOUS

## 2020-10-13 NOTE — ED Provider Notes (Signed)
[facility]MENT Provider Note   CSN: [id number] Arrival date & time: 10/13/20  1656     History Chief Complaint  Patient presents with  . Vomiting    Tamara Powell is a 75 y.o. female.  HPI      Tamara Powell is a 75 y.o. female, with a history of metastatic pancreatic cancer, presenting to the ED with abdominal pain, nausea, vomiting recurring yesterday.  Patient states she was admitted to [facility] Friday, March 4 for abdominal pain, nausea, vomiting, and diarrhea.  While there, she was diagnosed with colitis.  She got to the point where she was able to tolerate some food and fluids and was discharged on antibiotics.  She then began to have recurrence of her symptoms.  She has tried Zofran and Phenergan without improvement.  She tends to vomit whenever she takes anything by mouth.  She made contact with her oncologist, Dr. Marin Olp, who advised her to come to the ED for likely admission to [facility].  Denies fever/chills, chest pain, shortness of breath, syncope, urinary symptoms, or any other complaints.  Past Medical History:  Diagnosis Date  . Cancer with unknown primary site ([place]) 07/19/2020  . Goals of care, counseling/discussion 07/19/2020  . Metastasis to liver with unknown primary site [facility]) 07/19/2020  . Pancreatic cancer metastasized to liver ([place]) 07/25/2020  . Pancreatic cancer metastasized to lung ([place]) 07/25/2020  . Skin cancer    melanoma found on abdomen and removed by Dr. Pablo Ledger; followed by dermatology every 6 months    Patient Active Problem List   Diagnosis Date Noted  . Small bowel obstruction ([place]) 10/13/2020  . Pancreatic cancer metastasized to liver ([place]) 07/25/2020  . Pancreatic cancer metastasized to lung ([place]) 07/25/2020  . Goals of care, counseling/discussion 07/19/2020  . Iron deficiency anemia 11/14/2016  . 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 Shingles 06/02/2012    Past Surgical  History:  Procedure Laterality Date  . IR IMAGING GUIDED PORT INSERTION  08/07/2020     OB History   No obstetric history on file.     No family history on file.  Social History   Tobacco Use  . Smoking status: Never Smoker  . Smokeless tobacco: Never Used  . Tobacco comment: never used tobacco  Vaping Use  . Vaping Use: Never used  Substance Use Topics  . Alcohol use: Not Currently    Alcohol/week: 0.0 standard drinks  . Drug use: Not Currently    Home Medications Prior to Admission medications   Medication Sig Start Date End Date Taking? Authorizing Provider  ondansetron (ZOFRAN-ODT) 4 MG disintegrating tablet Take 4 mg by mouth every 6 (six) hours as needed. 09/29/20  Yes [provider]  oxyCODONE (OXY IR/ROXICODONE) 5 MG immediate release tablet Take 1 tablet (5 mg total) by mouth every 4 (four) hours as needed for severe pain. 10/06/20  Yes [place], Holli Humbles, NP  amoxicillin-clavulanate (AUGMENTIN) 875-125 MG tablet Take by mouth. 09/29/20   [provider]  b complex vitamins capsule Take 1 capsule by mouth daily.    [provider]  calcium carbonate (OS-CAL) 600 MG TABS Take 600 mg by mouth 2 (two) times daily with a meal.    [provider]  Cholecalciferol 25 MCG (1000 UT) tablet 1,000 Units daily. 07/09/20   [provider]  co-enzyme Q-10 30 MG capsule Take 100 mg by mouth 2 (two) times daily.    [provider]  COLLAGEN PO Take by mouth. One scoop of powder daily in coffee.    [provider]  dicyclomine (BENTYL) 10 MG capsule Take 10 mg by mouth every 6 (six) hours as needed. 08/23/20   [provider]  fish oil-omega-3 fatty acids 1000 MG capsule Take 1 g by mouth 2 (two) times daily.    [provider]  Nyoka Cowden Tea, Camellia sinensis, (GREEN TEA EXTRACT PO) Take by mouth. Takes 3 caps daily.    [provider]  magic mouthwash w/lidocaine SOLN Take 5 mLs by mouth 3 (three)  times daily as needed for mouth pain. 09/15/20   [place], Holli Humbles, NP  Multiple Vitamin (MULTIVITAMIN) tablet Take 1 tablet by mouth daily.    [provider]  Nutritional Supplements (GRAPESEED EXTRACT) 500-50 MG CAPS Take by mouth 2 (two) times daily.    [provider]  nystatin (MYCOSTATIN) 100000 UNIT/ML suspension Take by mouth. 09/15/20   [provider]  omeprazole (PRILOSEC) 20 MG capsule Take 20 mg by mouth every morning. 09/04/20   [provider]  OVER THE COUNTER MEDICATION Take by mouth 2 (two) times daily. WELLNESS FORMULA CAPSULE--HAIR SKIN NAILS CAPSULES.    [provider]  OVER THE COUNTER MEDICATION Green Vibrance daily.    [provider]  oxyCODONE-acetaminophen (PERCOCET/ROXICET) 5-325 MG tablet Take by mouth every 6 (six) hours as needed for severe pain.    [provider]  Turmeric 450 MG CAPS Take by mouth every morning.    [provider]  vitamin E 180 MG (400 UNITS) capsule 400 Units daily. 07/09/20   [provider]  prochlorperazine (COMPAZINE) 10 MG tablet Take 1 tablet (10 mg total) by mouth every 6 (six) hours as needed (Nausea or vomiting). 08/02/20 10/11/20  [name], MD    Allergies    Alendronate, Ibandronic acid, and Latex  Review of Systems   Review of Systems  Constitutional: Negative for chills, diaphoresis and fever.  Respiratory: Negative for shortness of breath.   Cardiovascular: Negative for chest pain.  Gastrointestinal: Positive for abdominal pain, nausea and vomiting. Negative for blood in stool, constipation and diarrhea.  Genitourinary: Negative for difficulty urinating and dysuria.  Musculoskeletal: Negative for back pain.  Neurological: Negative for syncope.  All other systems reviewed and are negative.   Physical Exam Updated Vital Signs BP 125/84 (BP Location: Right Arm)   Pulse (!) 110   Temp 98 F (36.7 C) (Oral)   Resp 19   Ht 5\' 8"  (1.727  m)   Wt 61.7 kg   SpO2 96%   BMI 20.68 kg/m   Physical Exam Vitals and nursing note reviewed.  Constitutional:      General: She is not in acute distress.    Appearance: She is well-developed. She is ill-appearing. She is not diaphoretic.  HENT:     Head: Normocephalic and atraumatic.     Mouth/Throat:     Mouth: Mucous membranes are moist.     Pharynx: Oropharynx is clear.  Eyes:     Conjunctiva/sclera: Conjunctivae normal.  Cardiovascular:     Rate and Rhythm: Normal rate and regular rhythm.     Pulses: Normal pulses.          Radial pulses are 2+ on the right side and 2+ on the left side.       Posterior tibial pulses are 2+ on the right side and 2+ on the left side.     Heart sounds: Normal heart  sounds.     Comments: Tactile temperature in the extremities appropriate and equal bilaterally. Pulmonary:     Effort: Pulmonary effort is normal. No respiratory distress.     Breath sounds: Normal breath sounds.  Abdominal:     Palpations: Abdomen is soft.     Tenderness: There is abdominal tenderness in the right lower quadrant, epigastric area, periumbilical area and suprapubic area. There is no guarding.  Musculoskeletal:     Cervical back: Neck supple.     Right lower leg: No edema.     Left lower leg: No edema.  Lymphadenopathy:     Cervical: No cervical adenopathy.  Skin:    General: Skin is warm and dry.  Neurological:     Mental Status: She is alert.  Psychiatric:        Mood and Affect: Mood and affect normal.        Speech: Speech normal.        Behavior: Behavior normal.     ED Results / Procedures / Treatments   Labs (all labs ordered are listed, but only abnormal results are displayed) Labs Reviewed  COMPREHENSIVE METABOLIC PANEL - Abnormal; Notable for the following components:      Result Value   Sodium 134 (*)    Chloride 97 (*)    Glucose, Bld 123 (*)    Total Protein 6.3 (*)    Albumin 3.1 (*)    All other components within normal limits  CBC  WITH DIFFERENTIAL/PLATELET - Abnormal; Notable for the following components:   WBC 16.8 (*)    RDW 17.2 (*)    Lymphs Abs 12.2 (*)    All other components within normal limits  RESP PANEL BY RT-PCR (FLU A&B, COVID) ARPGX2  LIPASE, BLOOD  URINALYSIS, ROUTINE W REFLEX MICROSCOPIC  LACTIC ACID, PLASMA  LACTIC ACID, PLASMA    EKG None  ED ECG REPORT   Date: 10/13/2020  Rate: 103  Rhythm: sinus tachycardia  QRS Axis: normal  Intervals: normal  ST/T Wave abnormalities: normal  Conduction Disutrbances:none  Narrative Interpretation:   Old EKG Reviewed: none available  I have personally reviewed the EKG tracing and disagree with the computerized printout as noted.             Radiology DG Abd 1 View  Result Date: 10/13/2020 CLINICAL DATA:  NG tube placement EXAM: ABDOMEN - 1 VIEW COMPARISON:  CT from same day FINDINGS: The tip of the NG tube projects over the gastric body. The side port is near the GE junction.the patient is status post prior vertebral augmentation of the L1 vertebral body. The bowel gas pattern is nonspecific. IMPRESSION: The tip of the NG tube projects over the gastric body. Recommend advancing the tube further into the stomach by approximately 3-5 cm. Electronically Signed   By: Constance Holster M.D.   On: 10/13/2020 21:56   CT ABDOMEN PELVIS W CONTRAST  Result Date: 10/13/2020 CLINICAL DATA:  Lower abdominal pain with nausea and vomiting. EXAM: CT ABDOMEN AND PELVIS WITH CONTRAST TECHNIQUE: Multidetector CT imaging of the abdomen and pelvis was performed using the standard protocol following bolus administration of intravenous contrast. CONTRAST:  171mL OMNIPAQUE IOHEXOL 300 MG/ML  SOLN COMPARISON:  September 25, 2020 FINDINGS: Lower chest: Mild linear scarring and/or atelectasis is seen within the bilateral lung bases. Hepatobiliary: Numerous stable heterogeneous low-attenuation liver lesions are seen. Status post cholecystectomy. No biliary dilatation.  Pancreas: Unremarkable. No pancreatic ductal dilatation or surrounding inflammatory changes. Spleen: Normal in size  without focal abnormality. Adrenals/Urinary Tract: Adrenal glands are unremarkable. Kidneys are normal, without renal calculi, focal lesion, or hydronephrosis. The urinary bladder is contracted and subsequently limited in evaluation. Stomach/Bowel: Stomach is within normal limits. Appendix appears normal. A segment of markedly dilated distal duodenum is seen within the left upper quadrant (maximum small bowel diameter of approximately 5.2 cm). An abrupt transition zone is noted within the medial aspect of the mid to upper left abdomen (axial CT images 33 through 40, CT series number 2). Noninflamed diverticula are seen throughout the sigmoid colon. Vascular/Lymphatic: No significant vascular findings are present. No enlarged abdominal or pelvic lymph nodes. Reproductive: Status post hysterectomy. No adnexal masses. Other: No abdominal wall hernia or abnormality. A mild amount of abdominal and pelvic free fluid is noted. Musculoskeletal: Chronic compression fracture deformities are seen at the levels of L1 and L2. IMPRESSION: 1. High-grade proximal small bowel obstruction. 2. Findings consistent with the patient's known liver metastasis. 3. Sigmoid diverticulosis. 4. Mild amount of abdominal and pelvic free fluid. 5. 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 Electronically Signed   By: Virgina Norfolk M.D.   On: 10/13/2020 19:49    Procedures Procedures   Medications Ordered in ED Medications  ondansetron (ZOFRAN) injection 4 mg (has no administration in time range)  morphine 2 MG/ML injection 2 mg (has no administration in time range)  lactated ringers infusion (has no administration in time range)  lactated ringers bolus 1,000 mL (1,000 mLs Intravenous New Bag/Given 10/13/20 1835)  metoCLOPramide (REGLAN) injection 10 mg (10 mg Intravenous Given 10/13/20 1835)   iohexol (OMNIPAQUE) 300 MG/ML solution 100 mL (100 mLs Intravenous Contrast Given 10/13/20 1920)  morphine 4 MG/ML injection 4 mg (4 mg Intravenous Given 10/13/20 2042)  ondansetron (ZOFRAN) injection 4 mg (4 mg Intravenous Given 10/13/20 2041)    ED Course  I have reviewed the triage vital signs and the nursing notes.  Pertinent labs & imaging results that were available during my care of the patient were reviewed by me and considered in my medical decision making (see chart for details).  Clinical Course as of 10/13/20 2219  Fri Oct 13, 2020  2021 Spoke with Dr. Lindi Adie, oncology. I made contact with him to close the loop since this patient was sent in by Dr. Marin Olp.  He states it would be helpful if the patient could be admitted to [facility].  He asks that I put Dr. Antonieta Pert name in the consulting physician slot for the patient. [SJ]  2025 Spoke with Dr. Brantley Stage, general surgeon.  We discussed the patient's symptoms, past medical history, and CT findings.  Agrees with placement of NG tube and treatments, such as analgesics, necessary to make patient more comfortable.  They will round on the patient.  Admit via medicine service. [SJ]  2112 Spoke with Dr. Cyd Silence, hospitalist.  States he will admit the patient to [facility]. Recommends placing as needed orders for the patient while she is here at [facility] waiting to be transferred. Morphine 2 mg IV every 2 hours as needed for moderate or severe pain. 4 mg Zofran IV every 6 hours as needed for nausea/vomiting.  May go to [facility] for refractory nausea/vomiting. Start fluid infusion 100cc/hr LR.  [SJ]    Clinical Course User Index [SJ] Joy, Maceo Pro   MDM Rules/Calculators/A&P                          Patient  presents with abdominal pain, nausea, vomiting. Ill-appearing, but nontoxic appearing.  Afebrile, not hypotensive.  Mild tachycardia. I personally reviewed and interpreted the patient's labs and imaging  studies. Leukocytosis present.  Some mild hyponatremia and hypochloremia. CT with evidence of proximal high-grade bowel obstruction.  Patient admitted for further management.  Findings and plan of care discussed with attending physician, Calvert Cantor, MD.   Vitals:   10/13/20 1816 10/13/20 2014 10/13/20 2100 10/13/20 2130  BP: 125/84 123/82 125/76 117/81  Pulse: 96 97 100 96  Resp: (!) 22 20 14 15   Temp: 98 F (36.7 C)     TempSrc: Oral     SpO2: 96% 97% 95% 95%  Weight:      Height:         Final Clinical Impression(s) / ED Diagnoses Final diagnoses:  SBO (small bowel obstruction) [facility])    Rx / DC Orders ED Discharge Orders    None       Layla Maw 10/13/20 2225    Truddie Hidden, MD 10/13/20 [phone]

## 2020-10-13 NOTE — Progress Notes (Signed)
Patient's husband stating that patient continues to throw everything up despite using the antiemetic medications. She is getting dehydrated again, with light liquid brown stool and states she is shaking. He wants to take her back to the hospital.  If possible, I asked him to take patient to [facility] as this is where Dr Marin Olp rounds on his patients, in addition to the fact, that when she is admitted to [facility], Dr Marin Olp cannot see records or have input in her care. [name] stated he would only take her to [facility] if they didn't need to wait in the waiting room. I informed him that we had no control over this. He is aware that if he calls an ambulance for the patient, she will be able to bypass the waiting room and even [place] and [place] from there. I advised him not to do that. He agreed to bring patient to [facility] ED in hopes of a shorter waiting room wait, and if patient needed to be admitted, she would go to [facility].  Dr Marin Olp aware.  Oncology Nurse Navigator Documentation  Oncology Nurse Navigator Flowsheets 10/13/2020  Confirmed Diagnosis Date -  Diagnosis Status -  Phase of Treatment -  Chemotherapy Pending- Reason: -  Chemotherapy Actual Start Date: -  Navigator Follow Up Date: 10/18/2020  Navigator Follow Up Reason: Chemotherapy  Navigator Location [facility]  Navigator Encounter Type Telephone  Telephone Symptom Mgt;Incoming Call  Treatment Initiated Date -  Patient Visit Type MedOnc  Treatment Phase Active Tx  Barriers/Navigation Needs Coordination of Care;Education  Education Other  Interventions Education;Psycho-Social Support  Acuity Level 2-Minimal Needs (1-2 Barriers Identified)  Coordination of Care Other  Education Method Verbal  Support Groups/Services Friends and Family  Time Spent with Patient 11

## 2020-10-13 NOTE — ED Triage Notes (Signed)
Pt c/o n/v started early am-denies pain at present-lower abd earlier-oncology advised pt to come to ED-pt NAD-to triage in w/c

## 2020-10-13 NOTE — ED Notes (Signed)
Lactic and covid swab collected

## 2020-10-13 NOTE — ED Notes (Signed)
Patient transported to CT 

## 2020-10-14 ENCOUNTER — Other Ambulatory Visit: Payer: Self-pay

## 2020-10-14 ENCOUNTER — Encounter (HOSPITAL_COMMUNITY): Payer: Self-pay | Admitting: Family Medicine

## 2020-10-14 DIAGNOSIS — K5989 Other specified functional intestinal disorders: Secondary | ICD-10-CM | POA: Diagnosis not present

## 2020-10-14 DIAGNOSIS — C7800 Secondary malignant neoplasm of unspecified lung: Secondary | ICD-10-CM | POA: Diagnosis present

## 2020-10-14 DIAGNOSIS — K5669 Other partial intestinal obstruction: Secondary | ICD-10-CM | POA: Diagnosis not present

## 2020-10-14 DIAGNOSIS — C259 Malignant neoplasm of pancreas, unspecified: Secondary | ICD-10-CM

## 2020-10-14 DIAGNOSIS — Z9221 Personal history of antineoplastic chemotherapy: Secondary | ICD-10-CM | POA: Diagnosis not present

## 2020-10-14 DIAGNOSIS — R531 Weakness: Secondary | ICD-10-CM | POA: Diagnosis not present

## 2020-10-14 DIAGNOSIS — C787 Secondary malignant neoplasm of liver and intrahepatic bile duct: Secondary | ICD-10-CM

## 2020-10-14 DIAGNOSIS — K56609 Unspecified intestinal obstruction, unspecified as to partial versus complete obstruction: Secondary | ICD-10-CM

## 2020-10-14 DIAGNOSIS — C9110 Chronic lymphocytic leukemia of B-cell type not having achieved remission: Secondary | ICD-10-CM

## 2020-10-14 DIAGNOSIS — D630 Anemia in neoplastic disease: Secondary | ICD-10-CM | POA: Diagnosis present

## 2020-10-14 DIAGNOSIS — Z0189 Encounter for other specified special examinations: Secondary | ICD-10-CM | POA: Diagnosis not present

## 2020-10-14 DIAGNOSIS — Z66 Do not resuscitate: Secondary | ICD-10-CM | POA: Diagnosis present

## 2020-10-14 DIAGNOSIS — Z515 Encounter for palliative care: Secondary | ICD-10-CM | POA: Diagnosis not present

## 2020-10-14 DIAGNOSIS — C482 Malignant neoplasm of peritoneum, unspecified: Secondary | ICD-10-CM | POA: Diagnosis not present

## 2020-10-14 DIAGNOSIS — C786 Secondary malignant neoplasm of retroperitoneum and peritoneum: Secondary | ICD-10-CM

## 2020-10-14 DIAGNOSIS — R52 Pain, unspecified: Secondary | ICD-10-CM | POA: Diagnosis not present

## 2020-10-14 DIAGNOSIS — E871 Hypo-osmolality and hyponatremia: Secondary | ICD-10-CM | POA: Diagnosis present

## 2020-10-14 DIAGNOSIS — E878 Other disorders of electrolyte and fluid balance, not elsewhere classified: Secondary | ICD-10-CM | POA: Diagnosis present

## 2020-10-14 DIAGNOSIS — K6389 Other specified diseases of intestine: Secondary | ICD-10-CM | POA: Diagnosis not present

## 2020-10-14 DIAGNOSIS — Z85820 Personal history of malignant melanoma of skin: Secondary | ICD-10-CM | POA: Diagnosis not present

## 2020-10-14 DIAGNOSIS — Z20822 Contact with and (suspected) exposure to covid-19: Secondary | ICD-10-CM | POA: Diagnosis present

## 2020-10-14 DIAGNOSIS — Z7189 Other specified counseling: Secondary | ICD-10-CM | POA: Diagnosis not present

## 2020-10-14 DIAGNOSIS — E441 Mild protein-calorie malnutrition: Secondary | ICD-10-CM | POA: Diagnosis not present

## 2020-10-14 DIAGNOSIS — C179 Malignant neoplasm of small intestine, unspecified: Secondary | ICD-10-CM | POA: Diagnosis not present

## 2020-10-14 DIAGNOSIS — Z8507 Personal history of malignant neoplasm of pancreas: Secondary | ICD-10-CM | POA: Diagnosis not present

## 2020-10-14 DIAGNOSIS — Z4682 Encounter for fitting and adjustment of non-vascular catheter: Secondary | ICD-10-CM | POA: Diagnosis not present

## 2020-10-14 LAB — COMPREHENSIVE METABOLIC PANEL
ALT: 14 U/L (ref 0–44)
AST: 15 U/L (ref 15–41)
Albumin: 2.8 g/dL — ABNORMAL LOW (ref 3.5–5.0)
Alkaline Phosphatase: 84 U/L (ref 38–126)
Anion gap: 9 (ref 5–15)
BUN: 11 mg/dL (ref 8–23)
CO2: 26 mmol/L (ref 22–32)
Calcium: 8.7 mg/dL — ABNORMAL LOW (ref 8.9–10.3)
Chloride: 101 mmol/L (ref 98–111)
Creatinine, Ser: 0.49 mg/dL (ref 0.44–1.00)
GFR, Estimated: >60 mL/min (ref >60)
Glucose, Bld: 102 mg/dL — ABNORMAL HIGH (ref 70–99)
Potassium: 3.8 mmol/L (ref 3.5–5.1)
Sodium: 136 mmol/L (ref 135–145)
Total Bilirubin: 0.8 mg/dL (ref 0.3–1.2)
Total Protein: 5.7 g/dL — ABNORMAL LOW (ref 6.5–8.1)

## 2020-10-14 LAB — PREALBUMIN: Prealbumin: 11.7 mg/dL — ABNORMAL LOW (ref 18–38)

## 2020-10-14 LAB — CBC
HCT: 32.6 % — ABNORMAL LOW (ref 36.0–46.0)
Hemoglobin: 10.5 g/dL — ABNORMAL LOW (ref 12.0–15.0)
MCH: 30.8 pg (ref 26.0–34.0)
MCHC: 32.2 g/dL (ref 30.0–36.0)
MCV: 95.6 fL (ref 80.0–100.0)
Platelets: 299 10*3/uL (ref 150–400)
RBC: 3.41 MIL/uL — ABNORMAL LOW (ref 3.87–5.11)
RDW: 17.4 % — ABNORMAL HIGH (ref 11.5–15.5)
WBC: 12.3 10*3/uL — ABNORMAL HIGH (ref 4.0–10.5)
nRBC: 0 % (ref 0.0–0.2)

## 2020-10-14 MED ORDER — SODIUM CHLORIDE 0.9 % IV SOLN: INTRAVENOUS | Status: AC

## 2020-10-14 MED ORDER — LIP MEDEX EX OINT: 1.0000 "application " | TOPICAL_OINTMENT | Freq: Two times a day (BID) | CUTANEOUS | Status: DC

## 2020-10-14 MED ORDER — ORAL CARE MOUTH RINSE
15.0000 mL | Freq: Two times a day (BID) | OROMUCOSAL | Status: DC
  Administered 2020-10-14 – 2020-10-16 (×5): 15 mL via OROMUCOSAL

## 2020-10-14 MED ORDER — ONDANSETRON HCL 4 MG PO TABS: 4.0000 mg | ORAL_TABLET | Freq: Four times a day (QID) | ORAL | Status: DC | PRN

## 2020-10-14 MED ORDER — SODIUM CHLORIDE 0.9 % IV SOLN: INTRAVENOUS | Status: DC

## 2020-10-14 MED ORDER — MENTHOL 3 MG MT LOZG
1.0000 | LOZENGE | OROMUCOSAL | Status: DC | PRN
  Administered 2020-10-15: 3 mg via ORAL
  Filled 2020-10-14: qty 9

## 2020-10-14 MED ORDER — BISACODYL 10 MG RE SUPP
10.0000 mg | Freq: Every day | RECTAL | Status: DC
  Administered 2020-10-14 – 2020-10-20 (×7): 10 mg via RECTAL
  Filled 2020-10-14 (×7): qty 1

## 2020-10-14 MED ORDER — CHLORHEXIDINE GLUCONATE 0.12 % MT SOLN
15.0000 mL | Freq: Two times a day (BID) | OROMUCOSAL | Status: DC
  Administered 2020-10-14 – 2020-10-20 (×13): 15 mL via OROMUCOSAL
  Filled 2020-10-14 (×12): qty 15

## 2020-10-14 MED ORDER — ACETAMINOPHEN 650 MG RE SUPP: 650.0000 mg | Freq: Four times a day (QID) | RECTAL | Status: DC | PRN

## 2020-10-14 MED ORDER — ONDANSETRON HCL 4 MG/2ML IJ SOLN
4.0000 mg | Freq: Four times a day (QID) | INTRAMUSCULAR | Status: DC | PRN
  Administered 2020-10-14 – 2020-10-19 (×5): 4 mg via INTRAVENOUS
  Filled 2020-10-14 (×2): qty 2

## 2020-10-14 MED ORDER — SODIUM CHLORIDE 0.9% FLUSH
10.0000 mL | INTRAVENOUS | Status: DC | PRN
  Administered 2020-10-20: 10 mL

## 2020-10-14 MED ORDER — ENOXAPARIN SODIUM 40 MG/0.4ML ~~LOC~~ SOLN
40.0000 mg | SUBCUTANEOUS | Status: DC
  Administered 2020-10-14 – 2020-10-18 (×5): 40 mg via SUBCUTANEOUS
  Filled 2020-10-14 (×5): qty 0.4

## 2020-10-14 MED ORDER — IPRATROPIUM BROMIDE 0.02 % IN SOLN: 0.5000 mg | Freq: Four times a day (QID) | RESPIRATORY_TRACT | Status: DC | PRN

## 2020-10-14 MED ORDER — SIMETHICONE 40 MG/0.6ML PO SUSP
40.0000 mg | Freq: Four times a day (QID) | ORAL | Status: DC | PRN
  Filled 2020-10-14: qty 0.6

## 2020-10-14 MED ORDER — ALBUTEROL SULFATE (2.5 MG/3ML) 0.083% IN NEBU: 2.5000 mg | INHALATION_SOLUTION | Freq: Four times a day (QID) | RESPIRATORY_TRACT | Status: DC | PRN

## 2020-10-14 MED ORDER — PHENOL 1.4 % MT LIQD
1.0000 | OROMUCOSAL | Status: DC | PRN
  Filled 2020-10-14: qty 177

## 2020-10-14 MED ORDER — MORPHINE SULFATE (PF) 2 MG/ML IV SOLN
1.0000 mg | Freq: Four times a day (QID) | INTRAVENOUS | Status: DC | PRN
  Filled 2020-10-14: qty 1

## 2020-10-14 MED ORDER — ALUM & MAG HYDROXIDE-SIMETH 200-200-20 MG/5ML PO SUSP: 30.0000 mL | Freq: Four times a day (QID) | ORAL | Status: DC | PRN

## 2020-10-14 MED ORDER — TRAZODONE HCL 50 MG PO TABS: 25.0000 mg | ORAL_TABLET | Freq: Every evening | ORAL | Status: DC | PRN

## 2020-10-14 MED ORDER — INSULIN ASPART 100 UNIT/ML ~~LOC~~ SOLN
0.0000 [IU] | Freq: Four times a day (QID) | SUBCUTANEOUS | Status: DC
  Administered 2020-10-15 – 2020-10-16 (×4): 1 [IU] via SUBCUTANEOUS

## 2020-10-14 MED ORDER — CHLORHEXIDINE GLUCONATE CLOTH 2 % EX PADS
6.0000 | MEDICATED_PAD | Freq: Every day | CUTANEOUS | Status: DC
  Administered 2020-10-14 – 2020-10-20 (×7): 6 via TOPICAL

## 2020-10-14 MED ORDER — MORPHINE SULFATE (PF) 2 MG/ML IV SOLN
2.0000 mg | INTRAVENOUS | Status: DC | PRN
  Administered 2020-10-14 – 2020-10-19 (×12): 2 mg via INTRAVENOUS
  Filled 2020-10-14 (×11): qty 1

## 2020-10-14 MED ORDER — TRAVASOL 10 % IV SOLN
INTRAVENOUS | Status: AC
  Filled 2020-10-14: qty 480

## 2020-10-14 MED ORDER — MAGIC MOUTHWASH
15.0000 mL | Freq: Four times a day (QID) | ORAL | Status: DC | PRN
  Filled 2020-10-14: qty 15

## 2020-10-14 MED ORDER — LIP MEDEX EX OINT
1.0000 "application " | TOPICAL_OINTMENT | CUTANEOUS | Status: DC | PRN
  Filled 2020-10-14: qty 7

## 2020-10-14 MED ORDER — ACETAMINOPHEN 325 MG PO TABS: 650.0000 mg | ORAL_TABLET | Freq: Four times a day (QID) | ORAL | Status: DC | PRN

## 2020-10-14 NOTE — Consult Note (Signed)
Referral MD  Reason for Referral: Small bowel obstruction-metastatic pancreatic cancer/CLL  Chief Complaint  Patient presents with  . Vomiting  : I just kept throwing up.  HPI: [name] is well-known to me.  She is a very nice 75 year old white female.  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  We never had to treat this as she is asymptomatic.  She subsequently was found to have metastatic pancreatic cancer.  She has had a tough time with treatment.  Initially, she was on FOLFIRINOX.  She had a difficult time with this.  She was recently in [facility] because of colitis.  We saw in the office this past week.  We gave her IV fluids.  She was feeling okay.  However, she then began to have vomiting.  She came to the emergency room.  CT scan was done.  This showed a small bowel obstruction in the duodenum.  No obvious tumor was associated with this.  She did have some stable liver metastasis.  There is little bit of abdominal fluid.  She has an NG tube in place.  Her labs show white cell count of 16.8.  Hemoglobin 12.1.  Platelet count.  85,000.  Her sodium is 134.  Potassium 4.2.  BUN 10 creatinine 0.34.  Calcium is 9 with an albumin of 3.1.  There is no fever.  She has had no diarrhea.  There has been no obvious bleeding.  Overall, I would say her performance status is ECOG 1-2.   Past Medical History:  Diagnosis Date  . Cancer with unknown primary site ([place]) 07/19/2020  . Goals of care, counseling/discussion 07/19/2020  . Metastasis to liver with unknown primary site [facility]) 07/19/2020  . Pancreatic cancer metastasized to liver ([place]) 07/25/2020  . Pancreatic cancer metastasized to lung ([place]) 07/25/2020  . Skin cancer    melanoma found on abdomen and removed by Dr. Pablo Ledger; followed by dermatology every 6 months  :  Past Surgical History:  Procedure Laterality Date  . IR IMAGING GUIDED PORT INSERTION  08/07/2020  :   Current Facility-Administered  Medications:  .  0.9 %  sodium chloride infusion, , Intravenous, Continuous, Hugelmeyer, Alexis, DO, Last Rate: 100 mL/hr at 10/14/20 0211, New Bag at 10/14/20 0211 .  acetaminophen (TYLENOL) tablet 650 mg, 650 mg, Oral, Q6H PRN **OR** acetaminophen (TYLENOL) suppository 650 mg, 650 mg, Rectal, Q6H PRN, Hugelmeyer, Alexis, DO .  albuterol (PROVENTIL) (2.5 MG/3ML) 0.083% nebulizer solution 2.5 mg, 2.5 mg, Nebulization, Q6H PRN, Hugelmeyer, Alexis, DO .  chlorhexidine (PERIDEX) 0.12 % solution 15 mL, 15 mL, Mouth Rinse, BID, Shalhoub, Sherryll Burger, MD .  Chlorhexidine Gluconate Cloth 2 % PADS 6 each, 6 each, Topical, Daily, Little Ishikawa, MD .  enoxaparin (LOVENOX) injection 40 mg, 40 mg, Subcutaneous, Q24H, Hugelmeyer, Alexis, DO .  ipratropium (ATROVENT) nebulizer solution 0.5 mg, 0.5 mg, Nebulization, Q6H PRN, Hugelmeyer, Alexis, DO .  lactated ringers infusion, , Intravenous, Continuous, Joy, Shawn C, PA-C, Last Rate: 100 mL/hr at 10/14/20 0059, New Bag at 10/14/20 0059 .  MEDLINE mouth rinse, 15 mL, Mouth Rinse, q12n4p, Shalhoub, Sherryll Burger, MD .  morphine 2 MG/ML injection 1 mg, 1 mg, Intravenous, Q6H PRN, Hugelmeyer, Alexis, DO .  morphine 2 MG/ML injection 2 mg, 2 mg, Intravenous, Q2H PRN, Joy, Shawn C, PA-C, 2 mg at 10/13/20 2359 .  morphine 2 MG/ML injection 2 mg, 2 mg, Intravenous, Q4H PRN, Hugelmeyer, Alexis, DO .  ondansetron (ZOFRAN) injection 4 mg,  4 mg, Intravenous, Q6H PRN, Joy, Shawn C, PA-C .  ondansetron (ZOFRAN) tablet 4 mg, 4 mg, Oral, Q6H PRN **OR** ondansetron (ZOFRAN) injection 4 mg, 4 mg, Intravenous, Q6H PRN, Hugelmeyer, Alexis, DO .  traZODone (DESYREL) tablet 25 mg, 25 mg, Oral, QHS PRN, Hugelmeyer, Alexis, DO:  . chlorhexidine  15 mL Mouth Rinse BID  . Chlorhexidine Gluconate Cloth  6 each Topical Daily  . enoxaparin (LOVENOX) injection  40 mg Subcutaneous Q24H  . mouth rinse  15 mL Mouth Rinse q12n4p  :  Allergies  Allergen Reactions  . Alendronate Swelling  .  Ibandronic Acid Swelling  . Latex Hives  :  History reviewed. No pertinent family history.:  Social History   Socioeconomic History  . Marital status: Married    Spouse name: Not on file  . Number of children: Not on file  . Years of education: Not on file  . Highest education level: Not on file  Occupational History  . Not on file  Tobacco Use  . Smoking status: Never Smoker  . Smokeless tobacco: Never Used  . Tobacco comment: never used tobacco  Vaping Use  . Vaping Use: Never used  Substance and Sexual Activity  . Alcohol use: Not Currently    Alcohol/week: 0.0 standard drinks  . Drug use: Not Currently  . Sexual activity: Not Currently  Other Topics Concern  . Not on file  Social History Narrative  . Not on file   Social Determinants of Health   Financial Resource Strain: Not on file  Food Insecurity: Not on file  Transportation Needs: Not on file  Physical Activity: Not on file  Stress: Not on file  Social Connections: Not on file  Intimate Partner Violence: Not on file  :  Review of Systems  Constitutional: Positive for malaise/fatigue.  HENT: Negative.   Eyes: Negative.   Respiratory: Negative.   Cardiovascular: Negative.   Gastrointestinal: Positive for abdominal pain and vomiting.  Genitourinary: Negative.   Musculoskeletal: Negative.   Skin: Negative.   Neurological: Negative.   Endo/Heme/Allergies: Negative.   Psychiatric/Behavioral: Negative.      Exam:  This is a fairly well-developed and well-nourished white female in no obvious distress.  She has an NG tube in place.  Vital signs are temperature 97.8.  Pulse 89.  Blood pressure 126/67.  Head and neck exam shows the NG tube.  She has no adenopathy in the neck.  There is no scleral icterus.  Lungs are clear.  Cardiac exam regular rate and rhythm with no murmurs, rubs or bruits.  Abdomen is soft.  Bowel sounds are decreased.  There is no fluid wave.  There is no guarding or rebound tenderness.   She has no obvious hepatosplenomegaly.  Extremities shows no clubbing, cyanosis or edema.  Neurological exam is nonfocal.  Skin exam shows no rashes, ecchymoses or petechia.  Patient Vitals for the past 24 hrs:  BP Temp Temp src Pulse Resp SpO2 Height Weight  10/14/20 0513 126/67 97.8 F (36.6 C) Oral 89 20 94 % -- --  10/14/20 0050 121/71 97.9 F (36.6 C) Oral 95 18 95 % -- 133 lb 9.6 oz (60.6 kg)  10/13/20 2356 137/84 -- -- -- 17 -- -- --  10/13/20 2341 128/82 -- -- 91 17 97 % -- --  10/13/20 2330 128/82 98.5 F (36.9 C) -- 82 20 96 % -- --  10/13/20 2130 117/81 -- -- 96 15 95 % -- --  10/13/20 2100 125/76 -- --  100 14 95 % -- --  10/13/20 2014 123/82 -- -- 97 20 97 % -- --  10/13/20 1816 125/84 98 F (36.7 C) Oral 96 (!) 22 96 % -- --  10/13/20 1726 125/84 98 F (36.7 C) Oral (!) 110 -- 96 % 5\' 8"  (1.727 m) 136 lb (61.7 kg)  10/13/20 1702 119/89 98 F (36.7 C) Oral (!) 117 19 100 % -- --     Recent Labs    10/11/20 0941 10/13/20 1826  WBC 15.8* 16.8*  HGB 11.5* 12.1  HCT 34.8* 36.5  PLT 319 385   Recent Labs    10/11/20 0941 10/13/20 1826  NA 134* 134*  K 4.1 4.2  CL 100 97*  CO2 24 24  GLUCOSE 145* 123*  BUN 7* 10  CREATININE 0.69 0.74  CALCIUM 9.0 9.0    Blood smear review: None  Pathology: None    Assessment and Plan: Ms. Towe is a nice 75 year old white female.  She has a history of CLL.  This is not a been an issue for her.  She recently was found to have metastatic pancreatic cancer.  She had been on chemotherapy with FOLFIRINOX.  We recently change this to Abraxane/Gemzar.  She has not yet started this.  She now has a small bowel obstruction.  This appears to be in the duodenum.  I do not believe that somehow this is malignant.  Of note, her CA 19-9 has been coming down slowly but surely.  This is certainly encouraging.  I do not know she needs any kind of upper endoscopy to see what might be going on.  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  I will  know if a stent needs to be placed.  I do think that nutrition is going be important.  I probably would try to get her on TNA right now.  I think this will help her overall status.  I just feel bad for her.  She has been through quite a lot.  I just hate that this is a new problem for her.  We will be more than happy to follow along and try to help out.  Hopefully, this is not going end up being some surgical issue where she has to have a bypass.  I know that she will get outstanding care from all the staff up on 5 [address].  I appreciate the compassion that the staff has for our patients.  Lattie Haw, MD  Psalm 144:1

## 2020-10-14 NOTE — Progress Notes (Signed)
Assumed care of patient @ 1530.  I agree with the previous nurses assessment.  Pt resting comfortably in bed, call bell within reach.  No needs at this time.

## 2020-10-14 NOTE — Consult Note (Signed)
Reason for Consult: Metastatic pancreatic cancer with small bowel obstruction Referring Physician: Avon Gully MD   Tamara Powell is an 75 y.o. female.  HPI: Pleasant 75 year old female with a history of stage IV pancreatic cancer who was transferred last night from the [place] emergency room due to small bowel obstruction.  She is followed by Dr. Burney Gauze.  She states she had a 2-day history of diffuse abdominal pain with nausea and vomiting.  No diarrhea.  CT scan showed small bowel obstruction with ascites as well as diffuse metastatic disease with pancreatic cancer.  She is on chemotherapy and was diagnosed in November 2021.  Has a history of CLL which has been stable since 2009.  She is comfortable now with an NG tube in place.  Husband at bedside as well.  Past Medical History:  Diagnosis Date  . Cancer with unknown primary site ([place]) 07/19/2020  . Goals of care, counseling/discussion 07/19/2020  . Metastasis to liver with unknown primary site [facility]) 07/19/2020  . Pancreatic cancer metastasized to liver ([place]) 07/25/2020  . Pancreatic cancer metastasized to lung ([place]) 07/25/2020  . Skin cancer    melanoma found on abdomen and removed by Dr. Pablo Ledger; followed by dermatology every 6 months    Past Surgical History:  Procedure Laterality Date  . IR IMAGING GUIDED PORT INSERTION  08/07/2020    History reviewed. No pertinent family history.  Social History:  reports that she has never smoked. She has never used smokeless tobacco. She reports previous alcohol use. She reports previous drug use.  Allergies:  Allergies  Allergen Reactions  . Alendronate Swelling  . Ibandronic Acid Swelling  . Latex Hives    Medications: I have reviewed the patient's current medications.  Results for orders placed or performed during the hospital encounter of 10/13/20 (from the past 48 hour(s))  Comprehensive metabolic panel     Status: Abnormal   Collection Time: 10/13/20  6:26 PM   Result Value Ref Range   Sodium 134 (L) 135 - 145 mmol/L   Potassium 4.2 3.5 - 5.1 mmol/L   Chloride 97 (L) 98 - 111 mmol/L   CO2 24 22 - 32 mmol/L   Glucose, Bld 123 (H) 70 - 99 mg/dL    Comment: Glucose reference range applies only to samples taken after fasting for at least 8 hours.   BUN 10 8 - 23 mg/dL   Creatinine, Ser 0.74 0.44 - 1.00 mg/dL   Calcium 9.0 8.9 - 10.3 mg/dL   Total Protein 6.3 (L) 6.5 - 8.1 g/dL   Albumin 3.1 (L) 3.5 - 5.0 g/dL   AST 18 15 - 41 U/L   ALT 14 0 - 44 U/L   Alkaline Phosphatase 98 38 - 126 U/L   Total Bilirubin 0.6 0.3 - 1.2 mg/dL   GFR, Estimated >60 >60 mL/min    Comment: (NOTE) Calculated using the CKD-EPI Creatinine Equation (2021)    Anion gap 13 5 - 15    Comment: Performed at [facility], [address] [place], [place] [address]  CBC with Differential     Status: Abnormal   Collection Time: 10/13/20  6:26 PM  Result Value Ref Range   WBC 16.8 (H) 4.0 - 10.5 K/uL   RBC 3.93 3.87 - 5.11 MIL/uL   Hemoglobin 12.1 12.0 - 15.0 g/dL   HCT 36.5 36.0 - 46.0 %   MCV 92.9 80.0 - 100.0 fL   MCH 30.8 26.0 - 34.0 pg  MCHC 33.2 30.0 - 36.0 g/dL   RDW 17.2 (H) 11.5 - 15.5 %   Platelets 385 150 - 400 K/uL   nRBC 0.0 0.0 - 0.2 %   Neutrophils Relative % 24 %   Neutro Abs 4.0 1.7 - 7.7 K/uL   Lymphocytes Relative 72 %   Lymphs Abs 12.2 (H) 0.7 - 4.0 K/uL   Monocytes Relative 3 %   Monocytes Absolute 0.4 0.1 - 1.0 K/uL   Eosinophils Relative 1 %   Eosinophils Absolute 0.1 0.0 - 0.5 K/uL   Basophils Relative 0 %   Basophils Absolute 0.1 0.0 - 0.1 K/uL   WBC Morphology Abnormal lymphocytes present     Comment: ABSOLUTE LYMPHOCYTOSIS >10% Reactive Benign Lymphoctyes SMUDGE CELLS    RBC Morphology MORPHOLOGY UNREMARKABLE    Smear Review Normal platelet morphology    Immature Granulocytes 0 %   Abs Immature Granulocytes 0.03 0.00 - 0.07 K/uL    Comment: Performed at [facility], [address] [place], [place]  [address]  Lipase, blood     Status: None   Collection Time: 10/13/20  6:26 PM  Result Value Ref Range   Lipase 33 11 - 51 U/L    Comment: Performed at [facility], [address] [place], [place] [address]  Lactic acid, plasma     Status: None   Collection Time: 10/13/20  9:25 PM  Result Value Ref Range   Lactic Acid, Venous 0.5 0.5 - 1.9 mmol/L    Comment: Performed at [facility], [address] [place], [place] [address]  Resp Panel by RT-PCR (Flu A&B, Covid) Nasopharyngeal Swab     Status: None   Collection Time: 10/13/20 10:02 PM   Specimen: Nasopharyngeal Swab; Nasopharyngeal(NP) swabs in vial transport medium  Result Value Ref Range   SARS Coronavirus 2 by RT PCR NEGATIVE NEGATIVE    Comment: (NOTE) SARS-CoV-2 target nucleic acids are NOT DETECTED.  The SARS-CoV-2 RNA is generally detectable in upper respiratory specimens during the acute phase of infection. The lowest concentration of SARS-CoV-2 viral copies this assay can detect is 138 copies/mL. A negative result does not preclude SARS-Cov-2 infection and should not be used as the sole basis for treatment or other patient management decisions. A negative result may occur with  improper specimen collection/handling, submission of specimen other than nasopharyngeal swab, presence of viral mutation(s) within the areas targeted by this assay, and inadequate number of viral copies(<138 copies/mL). A negative result must be combined with clinical observations, patient history, and epidemiological information. The expected result is Negative.  Fact Sheet for Patients:  EntrepreneurPulse.com.au  Fact Sheet for Healthcare Providers:  IncredibleEmployment.be  This test is no t yet approved or cleared by the [place] FDA and  has been authorized for detection and/or diagnosis of SARS-CoV-2 by FDA under an Emergency Use Authorization (EUA). This EUA will remain  in  effect (meaning this test can be used) for the duration of the COVID-19 declaration under Section 564(b)(1) of the Act, 21 U.S.C.section 360bbb-3(b)(1), unless the authorization is terminated  or revoked sooner.       Influenza A by PCR NEGATIVE NEGATIVE   Influenza B by PCR NEGATIVE NEGATIVE    Comment: (NOTE) The Xpert Xpress SARS-CoV-2/FLU/RSV plus assay is intended as an aid in the diagnosis of influenza from Nasopharyngeal swab specimens and should not be used as a sole basis for treatment. Nasal washings and aspirates are unacceptable for Xpert  Xpress SARS-CoV-2/FLU/RSV testing.  Fact Sheet for Patients: EntrepreneurPulse.com.au  Fact Sheet for Healthcare Providers: IncredibleEmployment.be  This test is not yet approved or cleared by the [place] FDA and has been authorized for detection and/or diagnosis of SARS-CoV-2 by FDA under an Emergency Use Authorization (EUA). This EUA will remain in effect (meaning this test can be used) for the duration of the COVID-19 declaration under Section 564(b)(1) of the Act, 21 U.S.C. section 360bbb-3(b)(1), unless the authorization is terminated or revoked.  Performed at [facility], [address] [place], [place] [address]   Comprehensive metabolic panel     Status: Abnormal   Collection Time: 10/14/20  9:44 AM  Result Value Ref Range   Sodium 136 135 - 145 mmol/L   Potassium 3.8 3.5 - 5.1 mmol/L   Chloride 101 98 - 111 mmol/L   CO2 26 22 - 32 mmol/L   Glucose, Bld 102 (H) 70 - 99 mg/dL    Comment: Glucose reference range applies only to samples taken after fasting for at least 8 hours.   BUN 11 8 - 23 mg/dL   Creatinine, Ser 0.49 0.44 - 1.00 mg/dL   Calcium 8.7 (L) 8.9 - 10.3 mg/dL   Total Protein 5.7 (L) 6.5 - 8.1 g/dL   Albumin 2.8 (L) 3.5 - 5.0 g/dL   AST 15 15 - 41 U/L   ALT 14 0 - 44 U/L   Alkaline Phosphatase 84 38 - 126 U/L   Total Bilirubin 0.8 0.3 - 1.2 mg/dL    GFR, Estimated >60 >60 mL/min    Comment: (NOTE) Calculated using the CKD-EPI Creatinine Equation (2021)    Anion gap 9 5 - 15    Comment: Performed at [facility], [place] [address] [place], [place] [address]  CBC     Status: Abnormal   Collection Time: 10/14/20  9:44 AM  Result Value Ref Range   WBC 12.3 (H) 4.0 - 10.5 K/uL   RBC 3.41 (L) 3.87 - 5.11 MIL/uL   Hemoglobin 10.5 (L) 12.0 - 15.0 g/dL   HCT 32.6 (L) 36.0 - 46.0 %   MCV 95.6 80.0 - 100.0 fL   MCH 30.8 26.0 - 34.0 pg   MCHC 32.2 30.0 - 36.0 g/dL   RDW 17.4 (H) 11.5 - 15.5 %   Platelets 299 150 - 400 K/uL   nRBC 0.0 0.0 - 0.2 %    Comment: Performed at [facility], [place] [address] [place], [place] [address]  Prealbumin     Status: Abnormal   Collection Time: 10/14/20  9:44 AM  Result Value Ref Range   Prealbumin 11.7 (L) 18 - 38 mg/dL    Comment: Performed at [facility], [place] [address], [place], [place] [address]    DG Abd 1 View  Result Date: 10/13/2020 CLINICAL DATA:  NG tube placement EXAM: ABDOMEN - 1 VIEW COMPARISON:  CT from same day FINDINGS: The tip of the NG tube projects over the gastric body. The side port is near the GE junction.the patient is status post prior vertebral augmentation of the L1 vertebral body. The bowel gas pattern is nonspecific. IMPRESSION: The tip of the NG tube projects over the gastric body. Recommend advancing the tube further into the stomach by approximately 3-5 cm. Electronically Signed   By: Constance Holster M.D.   On: 10/13/2020 21:56   CT ABDOMEN PELVIS W CONTRAST  Result Date: 10/13/2020 CLINICAL DATA:  Lower abdominal pain  with nausea and vomiting. EXAM: CT ABDOMEN AND PELVIS WITH CONTRAST TECHNIQUE: Multidetector CT imaging of the abdomen and pelvis was performed using the standard protocol following bolus administration of intravenous contrast. CONTRAST:  131mL OMNIPAQUE IOHEXOL 300 MG/ML  SOLN COMPARISON:  September 25, 2020 FINDINGS: Lower chest: Mild linear scarring and/or atelectasis is seen within the bilateral lung bases. Hepatobiliary: Numerous stable heterogeneous low-attenuation liver lesions are seen. Status post cholecystectomy. No biliary dilatation. Pancreas: Unremarkable. No pancreatic ductal dilatation or surrounding inflammatory changes. Spleen: Normal in size without focal abnormality. Adrenals/Urinary Tract: Adrenal glands are unremarkable. Kidneys are normal, without renal calculi, focal lesion, or hydronephrosis. The urinary bladder is contracted and subsequently limited in evaluation. Stomach/Bowel: Stomach is within normal limits. Appendix appears normal. A segment of markedly dilated distal duodenum is seen within the left upper quadrant (maximum small bowel diameter of approximately 5.2 cm). An abrupt transition zone is noted within the medial aspect of the mid to upper left abdomen (axial CT images 33 through 40, CT series number 2). Noninflamed diverticula are seen throughout the sigmoid colon. Vascular/Lymphatic: No significant vascular findings are present. No enlarged abdominal or pelvic lymph nodes. Reproductive: Status post hysterectomy. No adnexal masses. Other: No abdominal wall hernia or abnormality. A mild amount of abdominal and pelvic free fluid is noted. Musculoskeletal: Chronic compression fracture deformities are seen at the levels of L1 and L2. IMPRESSION: 1. High-grade proximal small bowel obstruction. 2. Findings consistent with the patient's known liver metastasis. 3. Sigmoid diverticulosis. 4. Mild amount of abdominal and pelvic free fluid. 5. 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 Electronically Signed   By: Virgina Norfolk M.D.   On: 10/13/2020 19:49    Review of Systems  Constitutional: Positive for appetite change. Negative for activity change.  HENT: Negative for congestion and dental problem.   Eyes: Negative.   Respiratory: Negative.    Cardiovascular: Negative.   Gastrointestinal: Positive for abdominal distention, abdominal pain, nausea and vomiting.  Endocrine: Negative.   Genitourinary: Negative.   Musculoskeletal: Negative.   Neurological: Negative.   Hematological: Negative.   Psychiatric/Behavioral: Negative.    Blood pressure 123/64, pulse 95, temperature 97.8 F (36.6 C), temperature source Oral, resp. rate 14, height 5\' 8"  (1.727 m), weight 60.6 kg, SpO2 96 %. Physical Exam Constitutional:      Appearance: Normal appearance.  HENT:     Head: Normocephalic and atraumatic.     Nose: Nose normal.     Mouth/Throat:     Mouth: Mucous membranes are moist.  Eyes:     Pupils: Pupils are equal, round, and reactive to light.  Cardiovascular:     Rate and Rhythm: Normal rate and regular rhythm.  Pulmonary:     Effort: Pulmonary effort is normal.     Breath sounds: Normal breath sounds.  Abdominal:     General: There is distension.     Palpations: Abdomen is soft.     Tenderness: There is no abdominal tenderness.  Musculoskeletal:        General: Normal range of motion.  Neurological:     General: No focal deficit present.     Mental Status: She is alert and oriented to person, place, and time.     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  NG tube for now.  Continue IV fluids.  Her examination is otherwise benign so hopefully this will resolve with decompression.  If not, she may require surgical intervention but this carries extremely high complication rate due to her active chemotherapy and metastatic cancer.  She may end up with a bypass or a palliative G-tube at this point depending on findings.  The ideal situation is to be able to continue chemotherapy after decompression without surgical intervention but either way her prognosis is poor at this point with these findings.  Recommend palliative  care to help with pain management going forward since her disease may be progressing despite chemotherapy given her obstruction at this point.  No previous upper abdominal surgery noted.  Plan discussed with patient and husband at bedside.  Hopefully this will resolve without operative intervention which explained that to them today.  Vaibhav Fogleman A Tannie Koskela 10/14/2020, 10:35 AM

## 2020-10-14 NOTE — H&P (Signed)
History and Physical   [facility] [place] @ [place] Admission History and Physical [employer], D.O.    Patient Name: Tamara Powell MR#: [id number] Date of Birth: 1946/06/27 Date of Admission: 10/13/2020  Referring MD/NP/PA: [facility] Primary Care Physician: Angelina Sheriff, MD  Chief Complaint:  Chief Complaint  Patient presents with  . Vomiting    HPI: Tamara Powell is a 75 y.o. 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 Marin Olp) presenting to [place] emergency department with 2-day history of abdominal pain nausea and vomiting. Of note, patient presented to [facility] 1 week ago with similar symptoms, was admitted for 3 days, diagnosed with colitis and discharged with antibiotics. Patient now presents with recurrent abdominal pain nausea and vomiting. CT imaging of the abdomen reveals high-grade small bowel obstruction. Case discussed with Dr. Brantley Stage with general surgery who recommends hospitalization on the medicine service and general surgery will round on the patient in the morning. Case also discussed with with oncology who stated that Dr. Marin Olp will round on the patient in the morning at [facility]. NG tube placed.       Patient states that she is not having any pain after the NG tube was placed.  Of note she is supposed to start a new chemotherapy next week  Patient denies fevers/chills, weakness, dizziness, chest pain, shortness of breath,  dysuria/frequency, changes in mental status.     Review of Systems:  CONSTITUTIONAL: No fever/chills, fatigue, weakness, weight gain/loss, headache. EYES: No blurry or double vision. ENT: No tinnitus, postnasal drip, redness or soreness of the oropharynx. RESPIRATORY: No cough, dyspnea, wheeze.  No hemoptysis.  CARDIOVASCULAR: No chest pain, palpitations, syncope, orthopnea. No lower extremity edema.   GASTROINTESTINAL: Positive nausea, vomiting, abdominal pain, negative diarrhea, constipation.  No hematemesis, melena or hematochezia. GENITOURINARY: No dysuria, frequency, hematuria. ENDOCRINE: No polyuria or nocturia. No heat or cold intolerance. HEMATOLOGY: No anemia, bruising, bleeding. INTEGUMENTARY: No rashes, ulcers, lesions. MUSCULOSKELETAL: No arthritis, gout, dyspnea. NEUROLOGIC: No numbness, tingling, ataxia, seizure-type activity, weakness. PSYCHIATRIC: No anxiety, depression, insomnia.   Past Medical History:  Diagnosis Date  . Cancer with unknown primary site ([place]) 07/19/2020  . Goals of care, counseling/discussion 07/19/2020  . Metastasis to liver with unknown primary site [facility]) 07/19/2020  . Pancreatic cancer metastasized to liver ([place]) 07/25/2020  . 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 Skin cancer    melanoma found on abdomen and removed by Dr. Pablo Ledger; followed by dermatology every 6 months    Past Surgical History:  Procedure Laterality Date  . IR IMAGING GUIDED PORT INSERTION  08/07/2020     reports that she has never smoked. She has never used smokeless tobacco. She reports previous alcohol use. She reports previous drug use.  Allergies  Allergen Reactions  . Alendronate Swelling  . Ibandronic Acid Swelling  . Latex Hives    History reviewed. No pertinent family history.  Prior to Admission medications   Medication Sig Start Date End Date Taking? Authorizing Provider  ondansetron (ZOFRAN-ODT) 4 MG disintegrating tablet Take 4 mg by mouth every 6 (six) hours as needed. 09/29/20  Yes [provider]  oxyCODONE (OXY IR/ROXICODONE) 5 MG immediate release tablet Take 1 tablet (5 mg total) by mouth every 4 (four) hours as needed for severe pain. 10/06/20  Yes [place], Holli Humbles, NP  amoxicillin-clavulanate (AUGMENTIN) 875-125 MG tablet Take by mouth. 09/29/20  [provider]  b complex vitamins capsule Take 1 capsule by  mouth daily.    [provider]  calcium carbonate (OS-CAL) 600 MG TABS Take 600 mg by mouth 2 (two) times daily with a meal.    [provider]  Cholecalciferol 25 MCG (1000 UT) tablet 1,000 Units daily. 07/09/20   [provider]  co-enzyme Q-10 30 MG capsule Take 100 mg by mouth 2 (two) times daily.    [provider]  COLLAGEN PO Take by mouth. One scoop of powder daily in coffee.    [provider]  dicyclomine (BENTYL) 10 MG capsule Take 10 mg by mouth every 6 (six) hours as needed. 08/23/20   [provider]  fish oil-omega-3 fatty acids 1000 MG capsule Take 1 g by mouth 2 (two) times daily.    [provider]  Nyoka Cowden Tea, Camellia sinensis, (GREEN TEA EXTRACT PO) Take by mouth. Takes 3 caps daily.    [provider]  magic mouthwash w/lidocaine SOLN Take 5 mLs by mouth 3 (three) times daily as needed for mouth pain. 09/15/20   [place], Holli Humbles, NP  Multiple Vitamin (MULTIVITAMIN) tablet Take 1 tablet by mouth daily.    [provider]  Nutritional Supplements (GRAPESEED EXTRACT) 500-50 MG CAPS Take by mouth 2 (two) times daily.    [provider]  nystatin (MYCOSTATIN) 100000 UNIT/ML suspension Take by mouth. 09/15/20   [provider]  omeprazole (PRILOSEC) 20 MG capsule Take 20 mg by mouth every morning. 09/04/20   [provider]  OVER THE COUNTER MEDICATION Take by mouth 2 (two) times daily. WELLNESS FORMULA CAPSULE--HAIR SKIN NAILS CAPSULES.    [provider]  OVER THE COUNTER MEDICATION Green Vibrance daily.    [provider]  oxyCODONE-acetaminophen (PERCOCET/ROXICET) 5-325 MG tablet Take by mouth every 6 (six) hours as needed for severe pain.    [provider]  Turmeric 450 MG CAPS Take by mouth every morning.    [provider]  vitamin E 180 MG (400 UNITS) capsule 400 Units daily. 07/09/20   [provider]  prochlorperazine  (COMPAZINE) 10 MG tablet Take 1 tablet (10 mg total) by mouth every 6 (six) hours as needed (Nausea or vomiting). 08/02/20 10/11/20  [name], MD    Physical Exam: Vitals:   10/13/20 2330 10/13/20 2341 10/13/20 2356 10/14/20 0050  BP: 128/82 128/82 137/84 121/71  Pulse: 82 91  95  Resp: 20 17 17 18   Temp: 98.5 F (36.9 C)   97.9 F (36.6 C)  TempSrc:    Oral  SpO2: 96% 97%  95%  Weight:    60.6 kg  Height:        GENERAL: 75 y.o.-year-old white female patient, well-developed, well-nourished lying in the bed in no acute distress.  Pleasant and cooperative.   HEENT: Head atraumatic, normocephalic. Pupils equal. Mucus membranes moist.  Tube in place draining dark brown liquid NECK: Supple. No JVD. CHEST: Normal breath sounds bilaterally. No wheezing, rales, rhonchi or crackles. No use of accessory muscles of respiration.   CARDIOVASCULAR: S1, S2 normal. No murmurs, rubs, or gallops. Cap refill <2 seconds. Pulses intact distally.  ABDOMEN: Soft, nondistended, nontender.  Hypoactive bowel sounds x4 quadrants no rebound, guarding, rigidity.  EXTREMITIES: No pedal edema, cyanosis, or clubbing. No calf tenderness or Homan's sign.  NEUROLOGIC: The patient is alert and oriented x 3. Cranial nerves II through XII are grossly intact with no focal sensorimotor deficit. PSYCHIATRIC:  Normal affect, mood, thought content. SKIN: Warm, dry, and intact without obvious rash, lesion, or ulcer.    Labs on Admission:  CBC: Recent Labs  Lab 10/11/20 0941 10/13/20 1826  WBC 15.8* 16.8*  NEUTROABS 3.6 4.0  HGB 11.5* 12.1  HCT 34.8* 36.5  MCV 92.6 92.9  PLT 319 865   Basic Metabolic Panel: Recent Labs  Lab 10/11/20 0941 10/13/20 1826  NA 134* 134*  K 4.1 4.2  CL 100 97*  CO2 24 24  GLUCOSE 145* 123*  BUN 7* 10  CREATININE 0.69 0.74  CALCIUM 9.0 9.0   GFR: Estimated Creatinine Clearance: 58.1 mL/min (by C-G formula based on SCr of 0.74 mg/dL). Liver Function Tests: Recent Labs   Lab 10/11/20 0941 10/13/20 1826  AST 16 18  ALT 12 14  ALKPHOS 113 98  BILITOT 0.6 0.6  PROT 6.1* 6.3*  ALBUMIN 3.6 3.1*   Recent Labs  Lab 10/13/20 1826  LIPASE 33   No results for input(s): AMMONIA in the last 168 hours. Coagulation Profile: No results for input(s): INR, PROTIME in the last 168 hours. Cardiac Enzymes: No results for input(s): CKTOTAL, CKMB, CKMBINDEX, TROPONINI in the last 168 hours. BNP (last 3 results) No results for input(s): PROBNP in the last 8760 hours. HbA1C: No results for input(s): HGBA1C in the last 72 hours. CBG: No results for input(s): GLUCAP in the last 168 hours. Lipid Profile: No results for input(s): CHOL, HDL, LDLCALC, TRIG, CHOLHDL, LDLDIRECT in the last 72 hours. Thyroid Function Tests: No results for input(s): TSH, T4TOTAL, FREET4, T3FREE, THYROIDAB in the last 72 hours. Anemia Panel: No results for input(s): VITAMINB12, FOLATE, FERRITIN, TIBC, IRON, RETICCTPCT in the last 72 hours. Urine analysis: No results found for: COLORURINE, APPEARANCEUR, LABSPEC, PHURINE, GLUCOSEU, HGBUR, BILIRUBINUR, KETONESUR, PROTEINUR, UROBILINOGEN, NITRITE, LEUKOCYTESUR Sepsis Labs: @LABRCNTIP (procalcitonin:4,lacticidven:4) ) Recent Results (from the past 240 hour(s))  Resp Panel by RT-PCR (Flu A&B, Covid) Nasopharyngeal Swab     Status: None   Collection Time: 10/13/20 10:02 PM   Specimen: Nasopharyngeal Swab; Nasopharyngeal(NP) swabs in vial transport medium  Result Value Ref Range Status   SARS Coronavirus 2 by RT PCR NEGATIVE NEGATIVE Final    Comment: (NOTE) SARS-CoV-2 target nucleic acids are NOT DETECTED.  The SARS-CoV-2 RNA is generally detectable in upper respiratory specimens during the acute phase of infection. The lowest concentration of SARS-CoV-2 viral copies this assay can detect is 138 copies/mL. A negative result does not preclude SARS-Cov-2 infection and should not be used as the sole basis for treatment or other patient  management decisions. A negative result may occur with  improper specimen collection/handling, submission of specimen other than nasopharyngeal swab, presence of viral mutation(s) within the areas targeted by this assay, and inadequate number of viral copies(<138 copies/mL). A negative result must be combined with clinical observations, patient history, and epidemiological information. The expected result is Negative.  Fact Sheet for Patients:  EntrepreneurPulse.com.au  Fact Sheet for Healthcare Providers:  IncredibleEmployment.be  This test is no t yet approved or cleared by the [place] FDA and  has been authorized for detection and/or diagnosis of SARS-CoV-2 by FDA under an Emergency Use Authorization (EUA). This EUA will remain  in effect (meaning this test can be used) for the duration of the COVID-19 declaration under Section 564(b)(1) of the Act, 21 U.S.C.section 360bbb-3(b)(1), unless the authorization is terminated  or revoked sooner.       Influenza A by PCR NEGATIVE NEGATIVE Final   Influenza B by PCR  NEGATIVE NEGATIVE Final    Comment: (NOTE) The Xpert Xpress SARS-CoV-2/FLU/RSV plus assay is intended as an aid in the diagnosis of influenza from Nasopharyngeal swab specimens and should not be used as a sole basis for treatment. Nasal washings and aspirates are unacceptable for Xpert Xpress SARS-CoV-2/FLU/RSV testing.  Fact Sheet for Patients: EntrepreneurPulse.com.au  Fact Sheet for Healthcare Providers: IncredibleEmployment.be  This test is not yet approved or cleared by the [place] FDA and has been authorized for detection and/or diagnosis of SARS-CoV-2 by FDA under an Emergency Use Authorization (EUA). This EUA will remain in effect (meaning this test can be used) for the duration of the COVID-19 declaration under Section 564(b)(1) of the Act, 21 U.S.C. section 360bbb-3(b)(1),  unless the authorization is terminated or revoked.  Performed at [facility], [address] [place], [place] [address]      Radiological Exams on Admission: DG Abd 1 View  Result Date: 10/13/2020 CLINICAL DATA:  NG tube placement EXAM: ABDOMEN - 1 VIEW COMPARISON:  CT from same day FINDINGS: The tip of the NG tube projects over the gastric body. The side port is near the GE junction.the patient is status post prior vertebral augmentation of the L1 vertebral body. The bowel gas pattern is nonspecific. IMPRESSION: The tip of the NG tube projects over the gastric body. Recommend advancing the tube further into the stomach by approximately 3-5 cm. Electronically Signed   By: Constance Holster M.D.   On: 10/13/2020 21:56   CT ABDOMEN PELVIS W CONTRAST  Result Date: 10/13/2020 CLINICAL DATA:  Lower abdominal pain with nausea and vomiting. EXAM: CT ABDOMEN AND PELVIS WITH CONTRAST TECHNIQUE: Multidetector CT imaging of the abdomen and pelvis was performed using the standard protocol following bolus administration of intravenous contrast. CONTRAST:  16mL OMNIPAQUE IOHEXOL 300 MG/ML  SOLN COMPARISON:  September 25, 2020 FINDINGS: Lower chest: Mild linear scarring and/or atelectasis is seen within the bilateral lung bases. Hepatobiliary: Numerous stable heterogeneous low-attenuation liver lesions are seen. Status post cholecystectomy. No biliary dilatation. Pancreas: Unremarkable. No pancreatic ductal dilatation or surrounding inflammatory changes. Spleen: Normal in size without focal abnormality. Adrenals/Urinary Tract: Adrenal glands are unremarkable. Kidneys are normal, without renal calculi, focal lesion, or hydronephrosis. The urinary bladder is contracted and subsequently limited in evaluation. Stomach/Bowel: Stomach is within normal limits. Appendix appears normal. A segment of markedly dilated distal duodenum is seen within the left upper quadrant (maximum small bowel diameter of  approximately 5.2 cm). An abrupt transition zone is noted within the medial aspect of the mid to upper left abdomen (axial CT images 33 through 40, CT series number 2). Noninflamed diverticula are seen throughout the sigmoid colon. Vascular/Lymphatic: No significant vascular findings are present. No enlarged abdominal or pelvic lymph nodes. Reproductive: Status post hysterectomy. No adnexal masses. Other: No abdominal wall hernia or abnormality. A mild amount of abdominal and pelvic free fluid is noted. Musculoskeletal: Chronic compression fracture deformities are seen at the levels of L1 and L2. IMPRESSION: 1. High-grade proximal small bowel obstruction. 2. Findings consistent with the patient's known liver metastasis. 3. Sigmoid diverticulosis. 4. Mild amount of abdominal and pelvic free fluid. 5. 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 Electronically Signed   By: Virgina Norfolk M.D.   On: 10/13/2020 19:49    EKG: Sinus tachycardia at 103 with normal axis and nonspecific ST-T wave changes.   Assessment/Plan  This is a 75 y.o. female with a history of pancreatic cancer with mets  to the liver now being admitted with:  #.  High-grade small bowel obstruction Admit inpatient telemetry -NG tube to low wall suction -IV normal saline -N.p.o. -Pain control -Surgical consultation for comanagement -requested by EDP -Oncology consultation-requested by EDP   Admission status: Inpatient IV Fluids: Normal saline Diet/Nutrition: N.p.o. Consults called: Surgery and oncology DVT Px: Lovenox, SCDs and early ambulation. Code Status: Full Code  Disposition Plan: To home in 1-2 days  All the records are reviewed and case discussed with ED provider. Management plans discussed with the patient and/or family who express understanding and agree with plan of care.  Yavier Snider D.O. on 10/14/2020 at 4:25 AM CC: Primary care physician; Angelina Sheriff, MD   10/14/2020, 4:25  AM

## 2020-10-14 NOTE — H&P (Incomplete)
History and Physical   [facility] [place] [facility] Admission History and Physical [employer], D.O.    Patient Name: Tamara Powell MR#: [id number] Date of Birth: 1946-06-17 Date of Admission: 10/13/2020  Referring MD/NP/PA: [facility] Primary Care Physician: Angelina Sheriff, MD  Chief Complaint:  Chief Complaint  Patient presents with  . Vomiting    HPI: Tamara Powell is a 75 y.o. female with a known history of metastatic pancreatic cancer presents to the emergency department for evaluation of abdominal pain, N/V.  Patient was in a usual state of health until   Patient denies fevers/chills, weakness, dizziness, chest pain, shortness of breath, N/V/C/D, abdominal pain, dysuria/frequency, changes in mental status.    Otherwise there has been no change in status. Patient has been taking medication as prescribed and there has been no recent change in medication or diet.  No recent antibiotics.  There has been no recent illness, hospitalizations, travel or sick contacts.    EMS/ED Course: Patient received ***. Medical admission has been requested for further management of ***.  Review of Systems:  CONSTITUTIONAL: No fever/chills, fatigue, weakness, weight gain/loss, headache. EYES: No blurry or double vision. ENT: No tinnitus, postnasal drip, redness or soreness of the oropharynx. RESPIRATORY: No cough, dyspnea, wheeze.  No hemoptysis.  CARDIOVASCULAR: No chest pain, palpitations, syncope, orthopnea. No lower extremity edema.  GASTROINTESTINAL: No nausea, vomiting, abdominal pain, diarrhea, constipation.  No hematemesis, melena or hematochezia. GENITOURINARY: No dysuria, frequency, hematuria. ENDOCRINE: No polyuria or nocturia. No heat or cold intolerance. HEMATOLOGY: No anemia, bruising, bleeding. INTEGUMENTARY: No rashes, ulcers, lesions. MUSCULOSKELETAL: No arthritis, gout, dyspnea. NEUROLOGIC: No numbness, tingling, ataxia, seizure-type activity,  weakness. PSYCHIATRIC: No anxiety, depression, insomnia.   Past Medical History:  Diagnosis Date  . Cancer with unknown primary site ([place]) 07/19/2020  . Goals of care, counseling/discussion 07/19/2020  . Metastasis to liver with unknown primary site [facility]) 07/19/2020  . Pancreatic cancer metastasized to liver ([place]) 07/25/2020  . Pancreatic cancer metastasized to lung ([place]) 07/25/2020  . Skin cancer    melanoma found on abdomen and removed by Dr. Pablo Ledger; followed by dermatology every 6 months    Past Surgical History:  Procedure Laterality Date  . IR IMAGING GUIDED PORT INSERTION  08/07/2020     reports that she has never smoked. She has never used smokeless tobacco. She reports previous alcohol use. She reports previous drug use.  Allergies  Allergen Reactions  . Alendronate Swelling  . Ibandronic Acid Swelling  . Latex Hives    History reviewed. No pertinent family history.  Prior to Admission medications   Medication Sig Start Date End Date Taking? Authorizing Provider  ondansetron (ZOFRAN-ODT) 4 MG disintegrating tablet Take 4 mg by mouth every 6 (six) hours as needed. 09/29/20  Yes [provider]  oxyCODONE (OXY IR/ROXICODONE) 5 MG immediate release tablet Take 1 tablet (5 mg total) by mouth every 4 (four) hours as needed for severe pain. 10/06/20  Yes [place], Holli Humbles, NP  amoxicillin-clavulanate (AUGMENTIN) 875-125 MG tablet Take by mouth. 09/29/20   [provider]  b complex vitamins capsule Take 1 capsule by mouth daily.    [provider]  calcium carbonate (OS-CAL) 600 MG TABS Take 600 mg by mouth 2 (two) times daily with a meal.    [provider]  Cholecalciferol 25 MCG (1000 UT) tablet 1,000 Units daily. 07/09/20   [provider]  co-enzyme Q-10 30 MG capsule Take 100 mg by mouth 2 (two)  times daily.    [provider]  COLLAGEN PO Take by mouth. One scoop of powder daily in coffee.    [provider]   dicyclomine (BENTYL) 10 MG capsule Take 10 mg by mouth every 6 (six) hours as needed. 08/23/20   [provider]  fish oil-omega-3 fatty acids 1000 MG capsule Take 1 g by mouth 2 (two) times daily.    [provider]  Nyoka Cowden Tea, Camellia sinensis, (GREEN TEA EXTRACT PO) Take by mouth. Takes 3 caps daily.    [provider]  magic mouthwash w/lidocaine SOLN Take 5 mLs by mouth 3 (three) times daily as needed for mouth pain. 09/15/20   [place], Holli Humbles, NP  Multiple Vitamin (MULTIVITAMIN) tablet Take 1 tablet by mouth daily.    [provider]  Nutritional Supplements (GRAPESEED EXTRACT) 500-50 MG CAPS Take by mouth 2 (two) times daily.    [provider]  nystatin (MYCOSTATIN) 100000 UNIT/ML suspension Take by mouth. 09/15/20   [provider]  omeprazole (PRILOSEC) 20 MG capsule Take 20 mg by mouth every morning. 09/04/20   [provider]  OVER THE COUNTER MEDICATION Take by mouth 2 (two) times daily. WELLNESS FORMULA CAPSULE--HAIR SKIN NAILS CAPSULES.    [provider]  OVER THE COUNTER MEDICATION Green Vibrance daily.    [provider]  oxyCODONE-acetaminophen (PERCOCET/ROXICET) 5-325 MG tablet Take by mouth every 6 (six) hours as needed for severe pain.    [provider]  Turmeric 450 MG CAPS Take by mouth every morning.    [provider]  vitamin E 180 MG (400 UNITS) capsule 400 Units daily. 07/09/20   [provider]  prochlorperazine (COMPAZINE) 10 MG tablet Take 1 tablet (10 mg total) by mouth every 6 (six) hours as needed (Nausea or vomiting). 08/02/20 10/11/20  [name], MD    Physical Exam: Vitals:   10/13/20 2330 10/13/20 2341 10/13/20 2356 10/14/20 0050  BP: 128/82 128/82 137/84 121/71  Pulse: 82 91  95  Resp: 20 17 17 18   Temp: 98.5 F (36.9 C)   97.9 F (36.6 C)  TempSrc:    Oral  SpO2: 96% 97%  95%  Weight:      Height:        GENERAL: 75 y.o.-year-old  *** patient, well-developed, well-nourished lying in the bed in no acute distress.  Pleasant and cooperative.   HEENT: Head atraumatic, normocephalic. Pupils equal. Mucus membranes moist. NECK: Supple. No JVD. CHEST: Normal breath sounds bilaterally. No wheezing, rales, rhonchi or crackles. No use of accessory muscles of respiration.  No reproducible chest wall tenderness.  CARDIOVASCULAR: S1, S2 normal. No murmurs, rubs, or gallops. Cap refill <2 seconds. Pulses intact distally.  ABDOMEN: Soft, nondistended, nontender. No rebound, guarding, rigidity. Normoactive bowel sounds present in all four quadrants.  EXTREMITIES: No pedal edema, cyanosis, or clubbing. No calf tenderness or Homan's sign.  NEUROLOGIC: The patient is alert and oriented x 3. Cranial nerves II through XII are grossly intact with no focal sensorimotor deficit. PSYCHIATRIC:  Normal affect, mood, thought content. SKIN: Warm, dry, and intact without obvious rash, lesion, or ulcer.    Labs on Admission:  CBC: Recent Labs  Lab 10/11/20 0941 10/13/20 1826  WBC 15.8* 16.8*  NEUTROABS 3.6 4.0  HGB 11.5* 12.1  HCT 34.8* 36.5  MCV 92.6 92.9  PLT 319 182   Basic Metabolic Panel: Recent Labs  Lab 10/11/20 0941 10/13/20 1826  NA 134* 134*  K 4.1  4.2  CL 100 97*  CO2 24 24  GLUCOSE 145* 123*  BUN 7* 10  CREATININE 0.69 0.74  CALCIUM 9.0 9.0   GFR: Estimated Creatinine Clearance: 59.2 mL/min (by C-G formula based on SCr of 0.74 mg/dL). Liver Function Tests: Recent Labs  Lab 10/11/20 0941 10/13/20 1826  AST 16 18  ALT 12 14  ALKPHOS 113 98  BILITOT 0.6 0.6  PROT 6.1* 6.3*  ALBUMIN 3.6 3.1*   Recent Labs  Lab 10/13/20 1826  LIPASE 33   No results for input(s): AMMONIA in the last 168 hours. Coagulation Profile: No results for input(s): INR, PROTIME in the last 168 hours. Cardiac Enzymes: No results for input(s): CKTOTAL, CKMB, CKMBINDEX, TROPONINI in the last 168 hours. BNP (last 3 results) No results  for input(s): PROBNP in the last 8760 hours. HbA1C: No results for input(s): HGBA1C in the last 72 hours. CBG: No results for input(s): GLUCAP in the last 168 hours. Lipid Profile: No results for input(s): CHOL, HDL, LDLCALC, TRIG, CHOLHDL, LDLDIRECT in the last 72 hours. Thyroid Function Tests: No results for input(s): TSH, T4TOTAL, FREET4, T3FREE, THYROIDAB in the last 72 hours. Anemia Panel: No results for input(s): VITAMINB12, FOLATE, FERRITIN, TIBC, IRON, RETICCTPCT in the last 72 hours. Urine analysis: No results found for: COLORURINE, APPEARANCEUR, LABSPEC, PHURINE, GLUCOSEU, HGBUR, BILIRUBINUR, KETONESUR, PROTEINUR, UROBILINOGEN, NITRITE, LEUKOCYTESUR Sepsis Labs: @LABRCNTIP (procalcitonin:4,lacticidven:4) ) Recent Results (from the past 240 hour(s))  Resp Panel by RT-PCR (Flu A&B, Covid) Nasopharyngeal Swab     Status: None   Collection Time: 10/13/20 10:02 PM   Specimen: Nasopharyngeal Swab; Nasopharyngeal(NP) swabs in vial transport medium  Result Value Ref Range Status   SARS Coronavirus 2 by RT PCR NEGATIVE NEGATIVE Final    Comment: (NOTE) SARS-CoV-2 target nucleic acids are NOT DETECTED.  The SARS-CoV-2 RNA is generally detectable in upper respiratory specimens during the acute phase of infection. The lowest concentration of SARS-CoV-2 viral copies this assay can detect is 138 copies/mL. A negative result does not preclude SARS-Cov-2 infection and should not be used as the sole basis for treatment or other patient management decisions. A negative result may occur with  improper specimen collection/handling, submission of specimen other than nasopharyngeal swab, presence of viral mutation(s) within the areas targeted by this assay, and inadequate number of viral copies(<138 copies/mL). A negative result must be combined with clinical observations, patient history, and epidemiological information. The expected result is Negative.  Fact Sheet for Patients:   EntrepreneurPulse.com.au  Fact Sheet for Healthcare Providers:  IncredibleEmployment.be  This test is no t yet approved or cleared by the [place] FDA and  has been authorized for detection and/or diagnosis of SARS-CoV-2 by FDA under an Emergency Use Authorization (EUA). This EUA will remain  in effect (meaning this test can be used) for the duration of the COVID-19 declaration under Section 564(b)(1) of the Act, 21 U.S.C.section 360bbb-3(b)(1), unless the authorization is terminated  or revoked sooner.       Influenza A by PCR NEGATIVE NEGATIVE Final   Influenza B by PCR NEGATIVE NEGATIVE Final    Comment: (NOTE) The Xpert Xpress SARS-CoV-2/FLU/RSV plus assay is intended as an aid in the diagnosis of influenza from Nasopharyngeal swab specimens and should not be used as a sole basis for treatment. Nasal washings and aspirates are unacceptable for Xpert Xpress SARS-CoV-2/FLU/RSV testing.  Fact Sheet for Patients: EntrepreneurPulse.com.au  Fact Sheet for Healthcare Providers: IncredibleEmployment.be  This test is not yet approved or cleared by the [place]  FDA and has been authorized for detection and/or diagnosis of SARS-CoV-2 by FDA under an Emergency Use Authorization (EUA). This EUA will remain in effect (meaning this test can be used) for the duration of the COVID-19 declaration under Section 564(b)(1) of the Act, 21 U.S.C. section 360bbb-3(b)(1), unless the authorization is terminated or revoked.  Performed at [facility], [address] [place], [place] [address]      Radiological Exams on Admission: DG Abd 1 View  Result Date: 10/13/2020 CLINICAL DATA:  NG tube placement EXAM: ABDOMEN - 1 VIEW COMPARISON:  CT from same day FINDINGS: The tip of the NG tube projects over the gastric body. The side port is near the GE junction.the patient is status post prior vertebral  augmentation of the L1 vertebral body. The bowel gas pattern is nonspecific. IMPRESSION: The tip of the NG tube projects over the gastric body. Recommend advancing the tube further into the stomach by approximately 3-5 cm. Electronically Signed   By: Constance Holster M.D.   On: 10/13/2020 21:56   CT ABDOMEN PELVIS W CONTRAST  Result Date: 10/13/2020 CLINICAL DATA:  Lower abdominal pain with nausea and vomiting. EXAM: CT ABDOMEN AND PELVIS WITH CONTRAST TECHNIQUE: Multidetector CT imaging of the abdomen and pelvis was performed using the standard protocol following bolus administration of intravenous contrast. CONTRAST:  12mL OMNIPAQUE IOHEXOL 300 MG/ML  SOLN COMPARISON:  September 25, 2020 FINDINGS: Lower chest: Mild linear scarring and/or atelectasis is seen within the bilateral lung bases. Hepatobiliary: Numerous stable heterogeneous low-attenuation liver lesions are seen. Status post cholecystectomy. No biliary dilatation. Pancreas: Unremarkable. No pancreatic ductal dilatation or surrounding inflammatory changes. Spleen: Normal in size without focal abnormality. Adrenals/Urinary Tract: Adrenal glands are unremarkable. Kidneys are normal, without renal calculi, focal lesion, or hydronephrosis. The urinary bladder is contracted and subsequently limited in evaluation. Stomach/Bowel: Stomach is within normal limits. Appendix appears normal. A segment of markedly dilated distal duodenum is seen within the left upper quadrant (maximum small bowel diameter of approximately 5.2 cm). An abrupt transition zone is noted within the medial aspect of the mid to upper left abdomen (axial CT images 33 through 40, CT series number 2). Noninflamed diverticula are seen throughout the sigmoid colon. Vascular/Lymphatic: No significant vascular findings are present. No enlarged abdominal or pelvic lymph nodes. Reproductive: Status post hysterectomy. No adnexal masses. Other: No abdominal wall hernia or abnormality. A mild  amount of abdominal and pelvic free fluid is noted. Musculoskeletal: Chronic compression fracture deformities are seen at the levels of L1 and L2. IMPRESSION: 1. High-grade proximal small bowel obstruction. 2. Findings consistent with the patient's known liver metastasis. 3. Sigmoid diverticulosis. 4. Mild amount of abdominal and pelvic free fluid. 5. 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 Electronically Signed   By: Virgina Norfolk M.D.   On: 10/13/2020 19:49    EKG: Normal sinus rhythm at *** bpm with normal axis and nonspecific ST-T wave changes.   Assessment/Plan  This is a 75 y.o. female with a history of *** now being admitted with:  #. ***  #. ***  #. ***  #. History of *** - Continue ***  #. History of *** - Continue ***  #. History of *** - Continue ***  #. History of *** - Continue ***  #. History of *** - Continue ***  Admission status: *** IV Fluids: *** Diet/Nutrition: *** Consults called: ***  DVT Px: Lovenox, SCDs and early ambulation. Code Status: Full Code  Disposition Plan: To home in *** days  All the records are reviewed and case discussed with ED provider. Management plans discussed with the patient and/or family who express understanding and agree with plan of care.  Alexis Hugelmeyer D.O. on 10/14/2020 at 1:01 AM CC: Primary care physician; Angelina Sheriff, MD   10/14/2020, 1:01 AM

## 2020-10-14 NOTE — Progress Notes (Signed)
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 Marin Olp) presenting to [facility] emergency department with 2-day history of abdominal pain nausea and vomiting. Of note, patient presented to [facility] 1 week ago with similar symptoms, was admitted for 3 days, diagnosed with colitis and discharged with antibiotics. Patient now presents with recurrent abdominal pain nausea and vomiting. CT imaging of the abdomen reveals high-grade small bowel obstruction. Case discussed with Dr. Brantley Stage with general surgery who recommends hospitalization - general surgery and oncology to follow.Outpatient plan to start new chemotherapy in the next week. NG tube placed   Assessment & Plan:   Active Problems:   Small bowel obstruction (HCC)  High grade SBO, POA - Surgery and oncology following, appreciate insight and recommendations - NG to low intermittent suction - IVF; remains NPO per protocol  Pancreatic cancer -Continue current regimen, tentative plan to change to new chemotherapy next week with Dr. Marin Olp oncology, defer to their expertise on whether or not this will need to be delayed in the setting of current acute issues   DVT prophylaxis: Lovenox Code Status: Full Family Communication: Husband at bedside  Status is: Inpatient  Dispo: The patient is from: Home              Anticipated d/c is to: Home              Anticipated d/c date is: >72 hours pending clinical course              Patient currently not medically stable for discharge  Consultants:   General surgery, oncology  Procedures:   None  Antimicrobials:  None indicated  Subjective: No acute issues or events overnight, abdominal distention and pain markedly improving with NG tube placement otherwise denies chest pain  fevers chills shortness of breath.  Objective: Vitals:   10/13/20 2341 10/13/20 2356 10/14/20 0050 10/14/20 0513  BP: 128/82 137/84 121/71 126/67  Pulse: 91  95 89  Resp: 17 17 18 20   Temp:   97.9 F (36.6 C) 97.8 F (36.6 C)  TempSrc:   Oral Oral  SpO2: 97%  95% 94%  Weight:   60.6 kg   Height:        Intake/Output Summary (Last 24 hours) at 10/14/2020 0744 Last data filed at 10/14/2020 0200 Gross per 24 hour  Intake 1101.24 ml  Output 700 ml  Net 401.24 ml   Filed Weights   10/13/20 1726 10/14/20 0050  Weight: 61.7 kg 60.6 kg    Examination:  General:  Pleasantly resting in bed, No acute distress. HEENT: NG tube draining dark green fluid, normocephalic atraumatic.  Sclerae nonicteric, noninjected.  Extraocular movements intact bilaterally. Neck:  Without mass or deformity.  Trachea is midline. Lungs:  Clear to auscultate bilaterally without rhonchi, wheeze, or rales. Heart:  Regular rate and rhythm.  Without murmurs, rubs, or gallops. Abdomen:  Soft, nontender, nondistended.  Without guarding or rebound. Extremities: Without cyanosis, clubbing, edema, or obvious deformity. Vascular:  Dorsalis pedis and posterior tibial pulses palpable bilaterally. Skin:  Warm and dry, no erythema, no ulcerations.  Data Reviewed: I have personally reviewed following labs and imaging studies  CBC: Recent Labs  Lab 10/11/20 0941 10/13/20 1826  WBC 15.8* 16.8*  NEUTROABS 3.6 4.0  HGB 11.5* 12.1  HCT 34.8*  36.5  MCV 92.6 92.9  PLT 319 563   Basic Metabolic Panel: Recent Labs  Lab 10/11/20 0941 10/13/20 1826  NA 134* 134*  K 4.1 4.2  CL 100 97*  CO2 24 24  GLUCOSE 145* 123*  BUN 7* 10  CREATININE 0.69 0.74  CALCIUM 9.0 9.0   GFR: Estimated Creatinine Clearance: 58.1 mL/min (by C-G formula based on SCr of 0.74 mg/dL). Liver Function Tests: Recent Labs  Lab 10/11/20 0941 10/13/20 1826  AST 16 18  ALT 12 14  ALKPHOS 113 98  BILITOT 0.6 0.6  PROT 6.1* 6.3*   ALBUMIN 3.6 3.1*   Recent Labs  Lab 10/13/20 1826  LIPASE 33   No results for input(s): AMMONIA in the last 168 hours. Coagulation Profile: No results for input(s): INR, PROTIME in the last 168 hours. Cardiac Enzymes: No results for input(s): CKTOTAL, CKMB, CKMBINDEX, TROPONINI in the last 168 hours. BNP (last 3 results) No results for input(s): PROBNP in the last 8760 hours. HbA1C: No results for input(s): HGBA1C in the last 72 hours. CBG: No results for input(s): GLUCAP in the last 168 hours. Lipid Profile: No results for input(s): CHOL, HDL, LDLCALC, TRIG, CHOLHDL, LDLDIRECT in the last 72 hours. Thyroid Function Tests: No results for input(s): TSH, T4TOTAL, FREET4, T3FREE, THYROIDAB in the last 72 hours. Anemia Panel: No results for input(s): VITAMINB12, FOLATE, FERRITIN, TIBC, IRON, RETICCTPCT in the last 72 hours. Sepsis Labs: Recent Labs  Lab 10/13/20 2125  LATICACIDVEN 0.5    Recent Results (from the past 240 hour(s))  Resp Panel by RT-PCR (Flu A&B, Covid) Nasopharyngeal Swab     Status: None   Collection Time: 10/13/20 10:02 PM   Specimen: Nasopharyngeal Swab; Nasopharyngeal(NP) swabs in vial transport medium  Result Value Ref Range Status   SARS Coronavirus 2 by RT PCR NEGATIVE NEGATIVE Final    Comment: (NOTE) SARS-CoV-2 target nucleic acids are NOT DETECTED.  The SARS-CoV-2 RNA is generally detectable in upper respiratory specimens during the acute phase of infection. The lowest concentration of SARS-CoV-2 viral copies this assay can detect is 138 copies/mL. A negative result does not preclude SARS-Cov-2 infection and should not be used as the sole basis for treatment or other patient management decisions. A negative result may occur with  improper specimen collection/handling, submission of specimen other than nasopharyngeal swab, presence of viral mutation(s) within the areas targeted by this assay, and inadequate number of viral copies(<138  copies/mL). A negative result must be combined with clinical observations, patient history, and epidemiological information. The expected result is Negative.  Fact Sheet for Patients:  EntrepreneurPulse.com.au  Fact Sheet for Healthcare Providers:  IncredibleEmployment.be  This test is no t yet approved or cleared by the [place] FDA and  has been authorized for detection and/or diagnosis of SARS-CoV-2 by FDA under an Emergency Use Authorization (EUA). This EUA will remain  in effect (meaning this test can be used) for the duration of the COVID-19 declaration under Section 564(b)(1) of the Act, 21 U.S.C.section 360bbb-3(b)(1), unless the authorization is terminated  or revoked sooner.       Influenza A by PCR NEGATIVE NEGATIVE Final   Influenza B by PCR NEGATIVE NEGATIVE Final    Comment: (NOTE) The Xpert Xpress SARS-CoV-2/FLU/RSV plus assay is intended as an aid in the diagnosis of influenza from Nasopharyngeal swab specimens and should not be used as a sole basis for treatment. Nasal washings and aspirates are unacceptable for Xpert Xpress SARS-CoV-2/FLU/RSV testing.  Fact Sheet for Patients:  EntrepreneurPulse.com.au  Fact Sheet for Healthcare Providers: IncredibleEmployment.be  This test is not yet approved or cleared by the [place] FDA and has been authorized for detection and/or diagnosis of SARS-CoV-2 by FDA under an Emergency Use Authorization (EUA). This EUA will remain in effect (meaning this test can be used) for the duration of the COVID-19 declaration under Section 564(b)(1) of the Act, 21 U.S.C. section 360bbb-3(b)(1), unless the authorization is terminated or revoked.  Performed at [facility], [address] [place], [place] [address]          Radiology Studies: DG Abd 1 View  Result Date: 10/13/2020 CLINICAL DATA:  NG tube placement EXAM: ABDOMEN - 1  VIEW COMPARISON:  CT from same day FINDINGS: The tip of the NG tube projects over the gastric body. The side port is near the GE junction.the patient is status post prior vertebral augmentation of the L1 vertebral body. The bowel gas pattern is nonspecific. IMPRESSION: The tip of the NG tube projects over the gastric body. Recommend advancing the tube further into the stomach by approximately 3-5 cm. Electronically Signed   By: Constance Holster M.D.   On: 10/13/2020 21:56   CT ABDOMEN PELVIS W CONTRAST  Result Date: 10/13/2020 CLINICAL DATA:  Lower abdominal pain with nausea and vomiting. EXAM: CT ABDOMEN AND PELVIS WITH CONTRAST TECHNIQUE: Multidetector CT imaging of the abdomen and pelvis was performed using the standard protocol following bolus administration of intravenous contrast. CONTRAST:  132mL OMNIPAQUE IOHEXOL 300 MG/ML  SOLN COMPARISON:  September 25, 2020 FINDINGS: Lower chest: Mild linear scarring and/or atelectasis is seen within the bilateral lung bases. Hepatobiliary: Numerous stable heterogeneous low-attenuation liver lesions are seen. Status post cholecystectomy. No biliary dilatation. Pancreas: Unremarkable. No pancreatic ductal dilatation or surrounding inflammatory changes. Spleen: Normal in size without focal abnormality. Adrenals/Urinary Tract: Adrenal glands are unremarkable. Kidneys are normal, without renal calculi, focal lesion, or hydronephrosis. The urinary bladder is contracted and subsequently limited in evaluation. Stomach/Bowel: Stomach is within normal limits. Appendix appears normal. A segment of markedly dilated distal duodenum is seen within the left upper quadrant (maximum small bowel diameter of approximately 5.2 cm). An abrupt transition zone is noted within the medial aspect of the mid to upper left abdomen (axial CT images 33 through 40, CT series number 2). Noninflamed diverticula are seen throughout the sigmoid colon. Vascular/Lymphatic: No significant vascular  findings are present. No enlarged abdominal or pelvic lymph nodes. Reproductive: Status post hysterectomy. No adnexal masses. Other: No abdominal wall hernia or abnormality. A mild amount of abdominal and pelvic free fluid is noted. Musculoskeletal: Chronic compression fracture deformities are seen at the levels of L1 and L2. IMPRESSION: 1. High-grade proximal small bowel obstruction. 2. Findings consistent with the patient's known liver metastasis. 3. Sigmoid diverticulosis. 4. Mild amount of abdominal and pelvic free fluid. 5. 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 Electronically Signed   By: Virgina Norfolk M.D.   On: 10/13/2020 19:49        Scheduled Meds: . chlorhexidine  15 mL Mouth Rinse BID  . Chlorhexidine Gluconate Cloth  6 each Topical Daily  . enoxaparin (LOVENOX) injection  40 mg Subcutaneous Q24H  . mouth rinse  15 mL Mouth Rinse q12n4p   Continuous Infusions: . sodium chloride 100 mL/hr at 10/14/20 0211  . lactated ringers 100 mL/hr at 10/14/20 0059     LOS: 0 days   Time spent: 74min  Lelani Garnett C Ford Peddie, DO [facility]  If 7PM-7AM, please contact night-coverage [email or web address]  10/14/2020, 7:44 AM

## 2020-10-14 NOTE — Progress Notes (Signed)
Initial Nutrition Assessment  DOCUMENTATION CODES:   Not applicable  INTERVENTION:  TPN management per Pharmacy  Pt is at risk for refeeding syndrome given poor/no po intake.  Monitor magnesium, potassium, and phosphorus daily for at least 3 days, MD to replete as needed.   NUTRITION DIAGNOSIS:   Inadequate oral intake related to acute illness (SBO) as evidenced by NPO status,other (comment) (initiation of TPN).    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 CT imaging of the abdomen reveals high-grade SBO.   General surgery and Oncology following. Per Surgery, plan is to proceed with non-operative measures at this point, but pt may require surgical intervention if SBO is not resolved with decompression. Surgery states that pt may end up with a bypass or palliative G-tube. Of note, pt is supposed to begin a new chemotherapy next week.   Pt with NGT to LIWS, gastric placement was verified via xray. Pt states this has reduced her abdominal pain. This is also noted to have significantly improved pt's abdominal distention.   RD consulted for new start TPN. Note, pt is at risk for refeeding syndrome given poor/no po intake.  Monitor magnesium, potassium, and phosphorus daily for at least 3 days, MD to replete as needed.  Per Pharmacy, plan to begin TPN at 61ml/hr at 1800 today. Electrolytes in TPN: Na 93mEq/L, K 17mEq/L, Ca 99mEq/L, Mg 55mEq/L, and Phos 74mmol/L. Cl:Ac 1:1 Add standard MVI daily and trace elements MWF to TPN Lipids MWF only due to national shortage. Goal rate to be determined after kcal/protein estimations are provided.   Reviewed weight history. Pt weighed 65.8 kg on 07/25/20 and now  weighs 60.6 kg. This indicates a 7.7% weight loss x3 months, which is significant for time frame. Pt likely meets criteria for malnutrition given significant weight loss; however, unable to diagnose at this time without nutrition-focused physical exam.   No UOP documented. NGT output: 1.1L x24 hours I/O: + 781.60ml since admit  Medications: dulcolax, ss novolog Q6H Labs reviewed.   NUTRITION - FOCUSED PHYSICAL EXAM:  Unable to perform at this time as RD is working remotely from another campus. Will attempt at follow-up.   Diet Order:   Diet Order            Diet NPO time specified Except for: Ice Chips  Diet effective now                 EDUCATION NEEDS:   No education needs have been identified at this time  Skin:  Skin Assessment: Reviewed RN Assessment  Last BM:  3/10  Height:   Ht Readings from Last 1 Encounters:  10/13/20 5\' 8"  (1.727 m)    Weight:   Wt Readings from Last 1 Encounters:  10/14/20 60.6 kg   BMI:  Body mass index is 20.31 kg/m.  Estimated Nutritional Needs:   Kcal:  1800-2000  Protein:  90-105 grams  Fluid:  >1.8 L    Larkin Ina, MS, RD, LDN RD pager number and weekend/on-call pager number located in [place].

## 2020-10-14 NOTE — Progress Notes (Signed)
PHARMACY - TOTAL PARENTERAL NUTRITION CONSULT NOTE   Indication: Small bowel obstruction  Patient Measurements: Height: 5\' 8"  (172.7 cm) Weight: 60.6 kg (133 lb 9.6 oz) IBW/kg (Calculated) : 63.9 TPN AdjBW (KG): 61.7 Body mass index is 20.31 kg/m.  75 y/o F with metastatic pancreatic cancer and recent colitis presented with vomiting and was found to have SBO .  Orders received to begin TPN with pharmacy assistance.  Recent Labs    10/11/20 0941 10/13/20 1826  NA 134* 134*  K 4.1 4.2  CL 100 97*  CO2 24 24  GLUCOSE 145* 123*  BUN 7* 10  CREATININE 0.69 0.74  CALCIUM 9.0 9.0  ALBUMIN 3.6 3.1*  ALKPHOS 113 98  AST 16 18  ALT 12 14  BILITOT 0.6 0.6    Assessment:   Glucose / Insulin: no hx of DM Electrolytes:  Currently WNL except Na slightly low Renal:  SCr and BUN WNL Hepatic: LFTs WNL Intake / Output; MIVF: I/O (-) 0.7L; NS at 100 mL/hr GI Imaging: 3/11: high-grade proximal SBO GI Surgeries / Procedures: Surgery to see in consult today.  Central access: [facility] 08/07/20 TPN start date: 10/14/2020  Nutritional Goals  RD recommendation pending Preliminary estimate: approximately 1700 KCal and 90 grams protein/day   Current Nutrition:  NPO   Plan:  Start TPN at 40 mL/hr at 1800 Electrolytes in TPN: Na 65mEq/L, K 23mEq/L, Ca 48mEq/L, Mg 9mEq/L, and Phos 44mmol/L. Cl:Ac 1:1 Add standard MVI daily and trace elements MWF to TPN Lipids MWF only due to national shortage Determine goal TPN rate after assessment by dietitian  When TPN begins at 1800: Initiate Sensitive q6h SSI and adjust as needed  Reduce MIVF to 60 mL/hr  Labs tomorrow:  CMet, Mg, Phos, TG, prealbumin  Monitor TPN labs on Mon/Thurs and as needed.  Clayburn Pert, PharmD, BCPS 10/14/2020  8:17 AM Please see AMION for all Pharmacists' Contact Phone Numbers

## 2020-10-14 NOTE — Plan of Care (Signed)

## 2020-10-15 ENCOUNTER — Inpatient Hospital Stay (HOSPITAL_COMMUNITY): Payer: Medicare Other

## 2020-10-15 LAB — MAGNESIUM: Magnesium: 2.1 mg/dL (ref 1.7–2.4)

## 2020-10-15 LAB — COMPREHENSIVE METABOLIC PANEL
ALT: 13 U/L (ref 0–44)
AST: 16 U/L (ref 15–41)
Albumin: 3 g/dL — ABNORMAL LOW (ref 3.5–5.0)
Alkaline Phosphatase: 86 U/L (ref 38–126)
Anion gap: 10 (ref 5–15)
BUN: 16 mg/dL (ref 8–23)
CO2: 26 mmol/L (ref 22–32)
Calcium: 8.7 mg/dL — ABNORMAL LOW (ref 8.9–10.3)
Chloride: 102 mmol/L (ref 98–111)
Creatinine, Ser: 0.56 mg/dL (ref 0.44–1.00)
GFR, Estimated: >60 mL/min (ref >60)
Glucose, Bld: 121 mg/dL — ABNORMAL HIGH (ref 70–99)
Potassium: 3.8 mmol/L (ref 3.5–5.1)
Sodium: 138 mmol/L (ref 135–145)
Total Bilirubin: 0.7 mg/dL (ref 0.3–1.2)
Total Protein: 5.8 g/dL — ABNORMAL LOW (ref 6.5–8.1)

## 2020-10-15 LAB — CBC
HCT: 34.4 % — ABNORMAL LOW (ref 36.0–46.0)
Hemoglobin: 11 g/dL — ABNORMAL LOW (ref 12.0–15.0)
MCH: 30.6 pg (ref 26.0–34.0)
MCHC: 32 g/dL (ref 30.0–36.0)
MCV: 95.8 fL (ref 80.0–100.0)
Platelets: 345 10*3/uL (ref 150–400)
RBC: 3.59 MIL/uL — ABNORMAL LOW (ref 3.87–5.11)
RDW: 17.2 % — ABNORMAL HIGH (ref 11.5–15.5)
WBC: 13.6 10*3/uL — ABNORMAL HIGH (ref 4.0–10.5)
nRBC: 0 % (ref 0.0–0.2)

## 2020-10-15 LAB — GLUCOSE, CAPILLARY
Glucose-Capillary: 110 mg/dL — ABNORMAL HIGH (ref 70–99)
Glucose-Capillary: 120 mg/dL — ABNORMAL HIGH (ref 70–99)
Glucose-Capillary: 125 mg/dL — ABNORMAL HIGH (ref 70–99)
Glucose-Capillary: 131 mg/dL — ABNORMAL HIGH (ref 70–99)
Glucose-Capillary: 149 mg/dL — ABNORMAL HIGH (ref 70–99)

## 2020-10-15 LAB — DIFFERENTIAL
Abs Immature Granulocytes: 0.03 10*3/uL (ref 0.00–0.07)
Basophils Absolute: 0 10*3/uL (ref 0.0–0.1)
Basophils Relative: 0 %
Eosinophils Absolute: 0.1 10*3/uL (ref 0.0–0.5)
Eosinophils Relative: 1 %
Immature Granulocytes: 0 %
Lymphocytes Relative: 70 %
Lymphs Abs: 9.6 10*3/uL — ABNORMAL HIGH (ref 0.7–4.0)
Monocytes Absolute: 0.2 10*3/uL (ref 0.1–1.0)
Monocytes Relative: 2 %
Neutro Abs: 3.6 10*3/uL (ref 1.7–7.7)
Neutrophils Relative %: 27 %

## 2020-10-15 LAB — PREALBUMIN: Prealbumin: 12.2 mg/dL — ABNORMAL LOW (ref 18–38)

## 2020-10-15 LAB — PHOSPHORUS: Phosphorus: 5.1 mg/dL — ABNORMAL HIGH (ref 2.5–4.6)

## 2020-10-15 LAB — TRIGLYCERIDES: Triglycerides: 118 mg/dL (ref <150)

## 2020-10-15 MED ORDER — TRAVASOL 10 % IV SOLN
INTRAVENOUS | Status: AC
  Filled 2020-10-15: qty 1056

## 2020-10-15 MED ORDER — DIATRIZOATE MEGLUMINE & SODIUM 66-10 % PO SOLN
90.0000 mL | Freq: Once | ORAL | Status: AC
  Administered 2020-10-15: 90 mL via NASOGASTRIC
  Filled 2020-10-15: qty 90

## 2020-10-15 NOTE — Progress Notes (Signed)
Subjective/Chief Complaint: Patient had one bowel movement yesterday.  She is comfortable.  Took 1 dose of pain medicine at about 3:00 this morning.  NG tube output is bilious.  Denies flatus.   Objective: Vital signs in last 24 hours: Temp:  [97.5 F (36.4 C)-98.3 F (36.8 C)] 97.5 F (36.4 C) (03/13 0528) Pulse Rate:  [92-97] 92 (03/13 0528) Resp:  [15-20] 20 (03/13 0528) BP: (119-126)/(55-79) 119/55 (03/13 0528) SpO2:  [96 %] 96 % (03/13 0528) Last BM Date: 10/14/20  Intake/Output from previous day: 03/12 0701 - 03/13 0700 In: 410.5 [I.V.:410.5] Out: 1200 [Emesis/NG output:1200] Intake/Output this shift: No intake/output data recorded.  General appearance: alert and cooperative Resp: clear to auscultation bilaterally Cardio: regular rate and rhythm, S1, S2 normal, no murmur, click, rub or gallop GI: Soft without significant distention.  No rebound or guarding.  Lab Results:  Recent Labs    10/14/20 0944 10/15/20 0526  WBC 12.3* 13.6*  HGB 10.5* 11.0*  HCT 32.6* 34.4*  PLT 299 345   BMET Recent Labs    10/14/20 0944 10/15/20 0526  NA 136 138  K 3.8 3.8  CL 101 102  CO2 26 26  GLUCOSE 102* 121*  BUN 11 16  CREATININE 0.49 0.56  CALCIUM 8.7* 8.7*   PT/INR No results for input(s): LABPROT, INR in the last 72 hours. ABG No results for input(s): PHART, HCO3 in the last 72 hours.  Invalid input(s): PCO2, PO2  Studies/Results: DG Abd 1 View  Result Date: 10/13/2020 CLINICAL DATA:  NG tube placement EXAM: ABDOMEN - 1 VIEW COMPARISON:  CT from same day FINDINGS: The tip of the NG tube projects over the gastric body. The side port is near the GE junction.the patient is status post prior vertebral augmentation of the L1 vertebral body. The bowel gas pattern is nonspecific. IMPRESSION: The tip of the NG tube projects over the gastric body. Recommend advancing the tube further into the stomach by approximately 3-5 cm. Electronically Signed   By: Constance Holster M.D.   On: 10/13/2020 21:56   CT ABDOMEN PELVIS W CONTRAST  Result Date: 10/13/2020 CLINICAL DATA:  Lower abdominal pain with nausea and vomiting. EXAM: CT ABDOMEN AND PELVIS WITH CONTRAST TECHNIQUE: Multidetector CT imaging of the abdomen and pelvis was performed using the standard protocol following bolus administration of intravenous contrast. CONTRAST:  147mL OMNIPAQUE IOHEXOL 300 MG/ML  SOLN COMPARISON:  September 25, 2020 FINDINGS: Lower chest: Mild linear scarring and/or atelectasis is seen within the bilateral lung bases. Hepatobiliary: Numerous stable heterogeneous low-attenuation liver lesions are seen. Status post cholecystectomy. No biliary dilatation. Pancreas: Unremarkable. No pancreatic ductal dilatation or surrounding inflammatory changes. Spleen: Normal in size without focal abnormality. Adrenals/Urinary Tract: Adrenal glands are unremarkable. Kidneys are normal, without renal calculi, focal lesion, or hydronephrosis. The urinary bladder is contracted and subsequently limited in evaluation. Stomach/Bowel: Stomach is within normal limits. Appendix appears normal. A segment of markedly dilated distal duodenum is seen within the left upper quadrant (maximum small bowel diameter of approximately 5.2 cm). An abrupt transition zone is noted within the medial aspect of the mid to upper left abdomen (axial CT images 33 through 40, CT series number 2). Noninflamed diverticula are seen throughout the sigmoid colon. Vascular/Lymphatic: No significant vascular findings are present. No enlarged abdominal or pelvic lymph nodes. Reproductive: Status post hysterectomy. No adnexal masses. Other: No abdominal wall hernia or abnormality. A mild amount of abdominal and pelvic free fluid is noted. Musculoskeletal: Chronic compression fracture  deformities are seen at the levels of L1 and L2. IMPRESSION: 1. High-grade proximal small bowel obstruction. 2. Findings consistent with the patient's known liver  metastasis. 3. Sigmoid diverticulosis. 4. Mild amount of abdominal and pelvic free fluid. 5. 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 Electronically Signed   By: Virgina Norfolk M.D.   On: 10/13/2020 19:49    Anti-infectives: Anti-infectives (From admission, onward)   None      Assessment/Plan: Stage IV pancreatic cancer with small bowel obstruction-patient had one bowel movement yesterday.  Place her on the small bowel protocol today and reassess in the morning.  Clinically, her abdomen is stable with no signs of peritonitis or worsening pain.    Has ascites which is worrisome for malignant ascites especially given her stage IV pancreatic cancer.  This could be associated with carcinomatosis which can lead to bowel obstruction.  Her obstruction is in the jejunum more than likely after reviewing her CT scan.  Hopefully, she will get better without surgery at this point in time.  I discussed potential plans if she does not as well.  LOS: 1 day    Turner Daniels MD  10/15/2020

## 2020-10-15 NOTE — Progress Notes (Signed)
PROGRESS NOTE    Tamara Powell  WLN:[id number] DOB: [birth year] DOA: 10/13/2020 PCP: Angelina Sheriff, MD   Brief Narrative:  75 year old female with past medical history of pancreatic cancer with mets to liver (follows with Dr. Marin Olp) presenting to [place] emergency department with 2-day history of abdominal pain nausea and vomiting. Of note, patient presented to [facility] 1 week ago with similar symptoms, was admitted for 3 days, diagnosed with colitis and discharged with antibiotics. Patient now presents with recurrent abdominal pain nausea and vomiting. CT imaging of the abdomen reveals high-grade small bowel obstruction. Case discussed with Dr. Brantley Stage with general surgery who recommends hospitalization - general surgery and oncology to follow.Outpatient plan to start new chemotherapy in the next week. 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 - IVF; remains NPO per protocol - TPN per oncology; pharmacy following labs - phos minimally elevated  Pancreatic cancer -Concern for carcinomatosis as well as metastatic disease given above -Continue current regimen, tentative plan to change to new chemotherapy next week with Dr. Marin Olp oncology, defer to their expertise on whether or not this will need to be delayed in the setting of current acute issues -Patient and husband today asking about previous PET scan  results versus repeating a PET scan, we discussed this cannot be done inpatient and would defer to Dr. Marin Olp for further discussion, evaluation and planning if this needs to be repeated given her previous PET scan was on 08/01/2020   DVT prophylaxis: Lovenox Code Status: Full Family Communication: Husband at bedside  Status is: Inpatient  Dispo: The patient is from: Home              Anticipated d/c is to: Home              Anticipated d/c date is: >72 hours pending clinical course              Patient currently not medically stable for discharge  Consultants:   General surgery, oncology  Procedures:   None  Antimicrobials:  None indicated  Subjective: No acute issues or events overnight, small liquid bowel movement overnight, abdominal pain distention markedly improving denies shortness of breath chest pain fevers chills nausea vomiting.  Objective: Vitals:   10/14/20 0855 10/14/20 1450 10/14/20 2005 10/15/20 0528  BP: 123/64 126/67 119/79 (!) 119/55  Pulse: 95 95 97 92  Resp: 14 15 16 20   Temp: 97.8 F (36.6 C) 98.1 F (36.7 C) 98.3 F (36.8 C) (!) 97.5 F (36.4 C)  TempSrc: Oral     SpO2: 96% 96% 96% 96%  Weight:      Height:        Intake/Output Summary (Last 24 hours) at 10/15/2020 0739 Last data filed at 10/15/2020 0533 Gross per 24 hour  Intake 410.48 ml  Output 1200 ml  Net -789.52 ml   Filed Weights   10/13/20 1726 10/14/20 0050  Weight: 61.7 kg 60.6 kg    Examination:  General:  Pleasantly resting in bed, No acute distress. HEENT: NG tube draining dark green fluid, normocephalic atraumatic.  Sclerae nonicteric, noninjected.  Extraocular movements intact bilaterally. Neck:  Without mass or deformity.  Trachea is midline. Lungs:  Clear to auscultate bilaterally without rhonchi, wheeze, or rales. Heart:  Regular rate and rhythm.  Without murmurs, rubs, or gallops. Abdomen:  Soft, nontender, nondistended.  Without guarding or  rebound. Extremities: Without cyanosis, clubbing, edema, or obvious deformity. Vascular:  Dorsalis pedis and posterior tibial pulses palpable bilaterally. Skin:  Warm and dry, no erythema, no ulcerations.  Data Reviewed: I have personally reviewed following labs and imaging studies  CBC: Recent Labs  Lab 10/11/20 0941 10/13/20 1826 10/14/20 0944 10/15/20 0526  WBC 15.8* 16.8* 12.3* 13.6*  NEUTROABS 3.6 4.0  --  3.6  HGB 11.5* 12.1 10.5* 11.0*  HCT 34.8* 36.5 32.6* 34.4*  MCV 92.6 92.9 95.6 95.8  PLT 319 385 299 938   Basic Metabolic Panel: Recent Labs  Lab 10/11/20 0941 10/13/20 1826 10/14/20 0944 10/15/20 0526  NA 134* 134* 136 138  K 4.1 4.2 3.8 3.8  CL 100 97* 101 102  CO2 24 24 26 26   GLUCOSE 145* 123* 102* 121*  BUN 7* 10 11 16   CREATININE 0.69 0.74 0.49 0.56  CALCIUM 9.0 9.0 8.7* 8.7*  MG  --   --   --  2.1  PHOS  --   --   --  5.1*   GFR: Estimated Creatinine Clearance: 58.1 mL/min (by C-G formula based on SCr of 0.56 mg/dL). Liver Function Tests: Recent Labs  Lab 10/11/20 0941 10/13/20 1826 10/14/20 0944 10/15/20 0526  AST 16 18 15 16   ALT 12 14 14 13   ALKPHOS 113 98 84 86  BILITOT 0.6 0.6 0.8 0.7  PROT 6.1* 6.3* 5.7* 5.8*  ALBUMIN 3.6 3.1* 2.8* 3.0*   Recent Labs  Lab 10/13/20 1826  LIPASE 33   No results for input(s): AMMONIA in the last 168 hours. Coagulation Profile: No results for input(s): INR, PROTIME in the last 168 hours. Cardiac Enzymes: No results for input(s): CKTOTAL, CKMB, CKMBINDEX, TROPONINI in the last 168 hours. BNP (last 3 results) No results for input(s): PROBNP in the last 8760 hours. HbA1C: No results for input(s): HGBA1C in the last 72 hours. CBG: Recent Labs  Lab 10/15/20 0042 10/15/20 0516  GLUCAP 110* 131*   Lipid Profile: Recent Labs    10/15/20 0526  TRIG 118   Thyroid Function Tests: No results for input(s): TSH, T4TOTAL, FREET4, T3FREE, THYROIDAB in the last 72 hours. Anemia Panel: No results  for input(s): VITAMINB12, FOLATE, FERRITIN, TIBC, IRON, RETICCTPCT in the last 72 hours. Sepsis Labs: Recent Labs  Lab 10/13/20 2125  LATICACIDVEN 0.5    Recent Results (from the past 240 hour(s))  Resp Panel by RT-PCR (Flu A&B, Covid) Nasopharyngeal Swab     Status: None   Collection Time: 10/13/20 10:02 PM   Specimen: Nasopharyngeal Swab; Nasopharyngeal(NP) swabs in vial transport medium  Result Value Ref Range Status   SARS Coronavirus 2 by RT PCR NEGATIVE NEGATIVE Final    Comment: (NOTE) SARS-CoV-2 target nucleic acids are NOT DETECTED.  The SARS-CoV-2 RNA is generally detectable in upper respiratory specimens during the acute phase of infection. The lowest concentration of SARS-CoV-2 viral copies this assay can detect is 138 copies/mL. A negative result does not preclude SARS-Cov-2 infection and should not be used as the sole basis for treatment or other patient management decisions. A negative  result may occur with  improper specimen collection/handling, submission of specimen other than nasopharyngeal swab, presence of viral mutation(s) within the areas targeted by this assay, and inadequate number of viral copies(<138 copies/mL). A negative result must be combined with clinical observations, patient history, and epidemiological information. The expected result is Negative.  Fact Sheet for Patients:  EntrepreneurPulse.com.au  Fact Sheet for Healthcare Providers:  IncredibleEmployment.be  This test is no t yet approved or cleared by the [place] FDA and  has been authorized for detection and/or diagnosis of SARS-CoV-2 by FDA under an Emergency Use Authorization (EUA). This EUA will remain  in effect (meaning this test can be used) for the duration of the COVID-19 declaration under Section 564(b)(1) of the Act, 21 U.S.C.section 360bbb-3(b)(1), unless the authorization is terminated  or revoked sooner.       Influenza A by PCR  NEGATIVE NEGATIVE Final   Influenza B by PCR NEGATIVE NEGATIVE Final    Comment: (NOTE) The Xpert Xpress SARS-CoV-2/FLU/RSV plus assay is intended as an aid in the diagnosis of influenza from Nasopharyngeal swab specimens and should not be used as a sole basis for treatment. Nasal washings and aspirates are unacceptable for Xpert Xpress SARS-CoV-2/FLU/RSV testing.  Fact Sheet for Patients: EntrepreneurPulse.com.au  Fact Sheet for Healthcare Providers: IncredibleEmployment.be  This test is not yet approved or cleared by the [place] FDA and has been authorized for detection and/or diagnosis of SARS-CoV-2 by FDA under an Emergency Use Authorization (EUA). This EUA will remain in effect (meaning this test can be used) for the duration of the COVID-19 declaration under Section 564(b)(1) of the Act, 21 U.S.C. section 360bbb-3(b)(1), unless the authorization is terminated or revoked.  Performed at [facility], [address] [place], [place] [address]          Radiology Studies: DG Abd 1 View  Result Date: 10/13/2020 CLINICAL DATA:  NG tube placement EXAM: ABDOMEN - 1 VIEW COMPARISON:  CT from same day FINDINGS: The tip of the NG tube projects over the gastric body. The side port is near the GE junction.the patient is status post prior vertebral augmentation of the L1 vertebral body. The bowel gas pattern is nonspecific. IMPRESSION: The tip of the NG tube projects over the gastric body. Recommend advancing the tube further into the stomach by approximately 3-5 cm. Electronically Signed   By: Constance Holster M.D.   On: 10/13/2020 21:56   CT ABDOMEN PELVIS W CONTRAST  Result Date: 10/13/2020 CLINICAL DATA:  Lower abdominal pain with nausea and vomiting. EXAM: CT ABDOMEN AND PELVIS WITH CONTRAST TECHNIQUE: Multidetector CT imaging of the abdomen and pelvis was performed using the standard protocol following bolus administration of  intravenous contrast. CONTRAST:  179mL OMNIPAQUE IOHEXOL 300 MG/ML  SOLN COMPARISON:  September 25, 2020 FINDINGS: Lower chest: Mild linear scarring and/or atelectasis is seen within the bilateral lung bases. Hepatobiliary: Numerous stable heterogeneous low-attenuation liver lesions are seen. Status post cholecystectomy. No biliary dilatation. Pancreas: Unremarkable. No pancreatic ductal dilatation or surrounding inflammatory changes. Spleen: Normal in size without focal abnormality. Adrenals/Urinary Tract: Adrenal glands are unremarkable. Kidneys are normal, without renal calculi, focal lesion, or hydronephrosis. The urinary bladder is contracted and subsequently limited in evaluation. Stomach/Bowel: Stomach is within normal limits. Appendix appears normal. A segment of markedly dilated distal duodenum is seen within the left upper quadrant (maximum small bowel diameter of approximately 5.2 cm). An abrupt transition zone is noted within the medial aspect of the mid to upper  left abdomen (axial CT images 33 through 40, CT series number 2). Noninflamed diverticula are seen throughout the sigmoid colon. Vascular/Lymphatic: No significant vascular findings are present. No enlarged abdominal or pelvic lymph nodes. Reproductive: Status post hysterectomy. No adnexal masses. Other: No abdominal wall hernia or abnormality. A mild amount of abdominal and pelvic free fluid is noted. Musculoskeletal: Chronic compression fracture deformities are seen at the levels of L1 and L2. IMPRESSION: 1. High-grade proximal small bowel obstruction. 2. Findings consistent with the patient's known liver metastasis. 3. Sigmoid diverticulosis. 4. Mild amount of abdominal and pelvic free fluid. 5. 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 Electronically Signed   By: Virgina Norfolk M.D.   On: 10/13/2020 19:49        Scheduled Meds: . bisacodyl  10 mg Rectal Daily  . chlorhexidine  15 mL Mouth Rinse BID  .  Chlorhexidine Gluconate Cloth  6 each Topical Daily  . enoxaparin (LOVENOX) injection  40 mg Subcutaneous Q24H  . insulin aspart  0-9 Units Subcutaneous Q6H  . mouth rinse  15 mL Mouth Rinse q12n4p   Continuous Infusions: . sodium chloride Stopped (10/14/20 1855)  . TPN ADULT (ION) 40 mL/hr at 10/14/20 1750     LOS: 1 day   Time spent: 32min  Kennethia Lynes C Yoselyn Mcglade, DO [facility]  If 7PM-7AM, please contact night-coverage [email or web address]  10/15/2020, 7:39 AM

## 2020-10-15 NOTE — Progress Notes (Signed)
NGT advanced 10 cm per radiologist.

## 2020-10-15 NOTE — Progress Notes (Signed)
PHARMACY - TOTAL PARENTERAL NUTRITION CONSULT NOTE   Indication: Small bowel obstruction  Patient Measurements: Height: 5\' 8"  (172.7 cm) Weight: 60.6 kg (133 lb 9.6 oz) IBW/kg (Calculated) : 63.9 TPN AdjBW (KG): 61.7 Body mass index is 20.31 kg/m.  Recent Labs    10/13/20 1826 10/14/20 0944  NA 134* 136  K 4.2 3.8  CL 97* 101  CO2 24 26  GLUCOSE 123* 102*  BUN 10 11  CREATININE 0.74 0.49  CALCIUM 9.0 8.7*  ALBUMIN 3.1* 2.8*  ALKPHOS 98 84  AST 18 15  ALT 14 14  BILITOT 0.6 0.8  PREALBUMIN  --  11.7*   Assessment:  75 y/o F with metastatic pancreatic cancer and recent colitis presented with vomiting and was found to have SBO. Orders received to begin TPN with pharmacy assistance.  Glucose / Insulin: no hx of DM. CBGs < 150. 1 units of SSI used in past 24hrs Electrolytes:  Currently WNL except Phos 5.1 Renal:  SCr and BUN WNL Hepatic: LFTs WNL. Trig wnl. Prealbumin 12.2 Intake / Output; MIVF: NG output high at 1.6 L yesterday. GI Imaging: 3/11: high-grade proximal SBO GI Surgeries / Procedures:  Central access: [facility] 08/07/20 TPN start date: 10/14/2020  Nutritional Goals  Kcal: 1800-2000 Protein 90-105 g Fluid > 1.8 L  MWF TPN - 33ml/hr with 105.6g of protein, 17% dextrose, 50g of lipids. This will give 2,032 kcal TTSS TPN - 34ml/hr, 105.6g of protein, 17% dextrose, no lipids. This will give 1,532 kcal. Kcal daily average = 1,746  Current Nutrition:  NPO, TPN  Plan:  Increase TPN to 80 mL/hr at 1800 This TPN provides 105g of protein, 326g of dextrose, and 0 g of lipids for a total of 1,532kcals meeting 80-85% of patient needs Electrolytes in TPN: Na 57mEq/L, K 73mEq/L, Ca 8mEq/L, Mg 16mEq/L, and remove Phos today. Cl:Ac 1:1 Add standard MVI daily and trace elements to TPN Lipids MWF only due to national shortage Continue sensitive SSI and adjust as needed Decrease NS to 40ml/hr at 1800  Monitor TPN labs, MG and Phos x 3 days   Elenor Quinones, PharmD,  BCPS, BCIDP Clinical Pharmacist 10/15/2020 7:11 AM

## 2020-10-16 ENCOUNTER — Inpatient Hospital Stay (HOSPITAL_COMMUNITY): Payer: Medicare Other

## 2020-10-16 DIAGNOSIS — C7800 Secondary malignant neoplasm of unspecified lung: Secondary | ICD-10-CM | POA: Diagnosis not present

## 2020-10-16 DIAGNOSIS — K56609 Unspecified intestinal obstruction, unspecified as to partial versus complete obstruction: Secondary | ICD-10-CM | POA: Diagnosis not present

## 2020-10-16 DIAGNOSIS — C787 Secondary malignant neoplasm of liver and intrahepatic bile duct: Secondary | ICD-10-CM | POA: Diagnosis not present

## 2020-10-16 DIAGNOSIS — Z515 Encounter for palliative care: Secondary | ICD-10-CM

## 2020-10-16 DIAGNOSIS — Z7189 Other specified counseling: Secondary | ICD-10-CM

## 2020-10-16 DIAGNOSIS — C259 Malignant neoplasm of pancreas, unspecified: Secondary | ICD-10-CM | POA: Diagnosis not present

## 2020-10-16 LAB — DIFFERENTIAL
Abs Immature Granulocytes: 0 10*3/uL (ref 0.00–0.07)
Basophils Absolute: 0 10*3/uL (ref 0.0–0.1)
Basophils Relative: 0 %
Eosinophils Absolute: 0 10*3/uL (ref 0.0–0.5)
Eosinophils Relative: 0 %
Lymphocytes Relative: 43 %
Lymphs Abs: 5.9 10*3/uL — ABNORMAL HIGH (ref 0.7–4.0)
Monocytes Absolute: 0 10*3/uL — ABNORMAL LOW (ref 0.1–1.0)
Monocytes Relative: 0 %
Neutro Abs: 6.2 10*3/uL (ref 1.7–7.7)
Neutrophils Relative %: 45 %
Other: 12 %

## 2020-10-16 LAB — COMPREHENSIVE METABOLIC PANEL
ALT: 13 U/L (ref 0–44)
AST: 16 U/L (ref 15–41)
Albumin: 2.9 g/dL — ABNORMAL LOW (ref 3.5–5.0)
Alkaline Phosphatase: 80 U/L (ref 38–126)
Anion gap: 12 (ref 5–15)
BUN: 29 mg/dL — ABNORMAL HIGH (ref 8–23)
CO2: 24 mmol/L (ref 22–32)
Calcium: 9.2 mg/dL (ref 8.9–10.3)
Chloride: 104 mmol/L (ref 98–111)
Creatinine, Ser: 0.58 mg/dL (ref 0.44–1.00)
GFR, Estimated: >60 mL/min (ref >60)
Glucose, Bld: 142 mg/dL — ABNORMAL HIGH (ref 70–99)
Potassium: 4.4 mmol/L (ref 3.5–5.1)
Sodium: 140 mmol/L (ref 135–145)
Total Bilirubin: 0.5 mg/dL (ref 0.3–1.2)
Total Protein: 6 g/dL — ABNORMAL LOW (ref 6.5–8.1)

## 2020-10-16 LAB — GLUCOSE, CAPILLARY
Glucose-Capillary: 138 mg/dL — ABNORMAL HIGH (ref 70–99)
Glucose-Capillary: 146 mg/dL — ABNORMAL HIGH (ref 70–99)
Glucose-Capillary: 144 mg/dL — ABNORMAL HIGH (ref 70–99)

## 2020-10-16 LAB — PHOSPHORUS: Phosphorus: 3.9 mg/dL (ref 2.5–4.6)

## 2020-10-16 LAB — CBC
HCT: 35.7 % — ABNORMAL LOW (ref 36.0–46.0)
Hemoglobin: 11.1 g/dL — ABNORMAL LOW (ref 12.0–15.0)
MCH: 30.2 pg (ref 26.0–34.0)
MCHC: 31.1 g/dL (ref 30.0–36.0)
MCV: 97.3 fL (ref 80.0–100.0)
Platelets: 354 10*3/uL (ref 150–400)
RBC: 3.67 MIL/uL — ABNORMAL LOW (ref 3.87–5.11)
RDW: 17.3 % — ABNORMAL HIGH (ref 11.5–15.5)
WBC: 13.7 10*3/uL — ABNORMAL HIGH (ref 4.0–10.5)
nRBC: 0 % (ref 0.0–0.2)

## 2020-10-16 LAB — TRIGLYCERIDES: Triglycerides: 108 mg/dL (ref <150)

## 2020-10-16 LAB — MAGNESIUM: Magnesium: 2.1 mg/dL (ref 1.7–2.4)

## 2020-10-16 LAB — PREALBUMIN: Prealbumin: 12.7 mg/dL — ABNORMAL LOW (ref 18–38)

## 2020-10-16 MED ORDER — TRAVASOL 10 % IV SOLN
INTRAVENOUS | Status: AC
  Filled 2020-10-16: qty 1056

## 2020-10-16 NOTE — Progress Notes (Signed)
PROGRESS NOTE    Tamara Powell  MPN:[id number] DOB: 03/10/1946 DOA: 10/13/2020 PCP: Angelina Sheriff, MD   Brief Narrative:  75 year old female with past medical history of pancreatic cancer with mets to liver (follows with Dr. Marin Olp) presenting to [facility] emergency department with 2-day history of abdominal pain nausea and vomiting. Of note, patient presented to [facility] 1 week ago with similar symptoms, was admitted for 3 days, diagnosed with colitis and discharged with antibiotics. Patient now presents with recurrent abdominal pain nausea and vomiting. CT imaging of the abdomen reveals high-grade small bowel obstruction. Case discussed with Dr. Brantley Stage with general surgery who recommends hospitalization - general surgery and oncology to follow.Outpatient plan to start new chemotherapy in the next week. 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 -Continue TPN per Dr. Marin Olp, IV fluids ongoing as well in setting of n.p.o. status  Pancreatic cancer Questionable carcinomatosis/ascites -Concern for carcinomatosis as well as metastatic disease given above; paracentesis discussed with patient from oncology standpoint, no real abdominal  distention or pain today, unclear if paracentesis would be successful -Continue current regimen, tentative plan to change to new chemotherapy next week with Dr. Marin Olp oncology, defer to their expertise on whether or not this will need to be delayed in the setting of current acute small bowel obstruction  DVT prophylaxis: Lovenox Code Status: Full Family Communication: Husband and friends at bedside  Status is: Inpatient  Dispo: The patient is from: Home              Anticipated d/c is to: Home              Anticipated d/c date is:48-72 hours pending clinical course              Patient currently not medically stable for discharge  Consultants:   General surgery, oncology  Procedures:   None  Antimicrobials:  None indicated  Subjective: No acute issues or events overnight, patient indicates small liquid bowel movement yesterday but nothing since, otherwise denies nausea vomiting diarrhea headache fevers or chills  Objective: Vitals:   10/15/20 1530 10/15/20 2219 10/16/20 0513 10/16/20 0540  BP: 131/65 136/67 126/64 122/67  Pulse: 92 94 88 65  Resp: 18 18 18 18   Temp: 98.1 F (36.7 C) 97.8 F (36.6 C) 97.8 F (36.6 C) 97.8 F (36.6 C)  TempSrc: Oral Oral Oral Oral  SpO2: 96% 96% 97% 92%  Weight:      Height:        Intake/Output Summary (Last 24 hours) at 10/16/2020 0716 Last data filed at 10/16/2020 0500 Gross per 24 hour  Intake 1316.39 ml  Output 2275 ml  Net -958.61 ml   Filed Weights   10/13/20 1726 10/14/20 0050  Weight: 61.7 kg 60.6 kg    Examination:  General:  Pleasantly resting in bed, No acute distress. HEENT: NG tube draining dark green fluid, normocephalic atraumatic.  Sclerae nonicteric, noninjected.  Extraocular movements intact bilaterally. Neck:  Without mass or deformity.  Trachea is midline. Lungs:  Clear to auscultate bilaterally without rhonchi, wheeze, or rales. Heart:  Regular rate and rhythm.  Without murmurs, rubs, or  gallops. Abdomen:  Soft, minimally tender diffusely, nondistended.  Without guarding or rebound. Extremities: Without cyanosis, clubbing, edema, or obvious deformity. Vascular:  Dorsalis pedis and posterior tibial pulses palpable bilaterally. Skin:  Warm and dry, no erythema, no ulcerations.  Data Reviewed: I have personally reviewed following labs and imaging studies  CBC: Recent Labs  Lab 10/11/20 0941 10/13/20 1826 10/14/20 0944 10/15/20 0526 10/16/20 0444  WBC 15.8* 16.8* 12.3* 13.6* 13.7*  NEUTROABS 3.6 4.0  --  3.6  --   HGB 11.5* 12.1 10.5* 11.0* 11.1*  HCT 34.8* 36.5 32.6* 34.4* 35.7*  MCV 92.6 92.9 95.6 95.8 97.3  PLT 319 385 299 345 102   Basic Metabolic Panel: Recent Labs  Lab 10/11/20 0941 10/13/20 1826 10/14/20 0944 10/15/20 0526 10/16/20 0444  NA 134* 134* 136 138 140  K 4.1 4.2 3.8 3.8 4.4  CL 100 97* 101 102 104  CO2 24 24 26 26 24   GLUCOSE 145* 123* 102* 121* 142*  BUN 7* 10 11 16  29*  CREATININE 0.69 0.74 0.49 0.56 0.58  CALCIUM 9.0 9.0 8.7* 8.7* 9.2  MG  --   --   --  2.1 2.1  PHOS  --   --   --  5.1* 3.9   GFR: Estimated Creatinine Clearance: 58.1 mL/min (by C-G formula based on SCr of 0.58 mg/dL). Liver Function Tests: Recent Labs  Lab 10/11/20 0941 10/13/20 1826 10/14/20 0944 10/15/20 0526 10/16/20 0444  AST 16 18 15 16 16   ALT 12 14 14 13 13   ALKPHOS 113 98 84 86 80  BILITOT 0.6 0.6 0.8 0.7 0.5  PROT 6.1* 6.3* 5.7* 5.8* 6.0*  ALBUMIN 3.6 3.1* 2.8* 3.0* 2.9*   Recent Labs  Lab 10/13/20 1826  LIPASE 33   No results for input(s): AMMONIA in the last 168 hours. Coagulation Profile: No results for input(s): INR, PROTIME in the last 168 hours. Cardiac Enzymes: No results for input(s): CKTOTAL, CKMB, CKMBINDEX, TROPONINI in the last 168 hours. BNP (last 3 results) No results for input(s): PROBNP in the last 8760 hours. HbA1C: No results for input(s): HGBA1C in the last 72 hours. CBG: Recent Labs  Lab 10/15/20 0516  10/15/20 1156 10/15/20 1737 10/15/20 2349 10/16/20 0510  GLUCAP 131* 120* 125* 149* 138*   Lipid Profile: Recent Labs    10/15/20 0526 10/16/20 0444  TRIG 118 108   Thyroid Function Tests: No results for input(s): TSH, T4TOTAL, FREET4, T3FREE, THYROIDAB in the last 72 hours. Anemia Panel: No results for input(s): VITAMINB12, FOLATE, FERRITIN, TIBC, IRON, RETICCTPCT in the last 72 hours. Sepsis Labs: Recent Labs  Lab 10/13/20 2125  LATICACIDVEN 0.5    Recent Results (from the past 240 hour(s))  Resp Panel by RT-PCR (Flu A&B, Covid) Nasopharyngeal Swab     Status: None   Collection Time: 10/13/20 10:02 PM   Specimen: Nasopharyngeal Swab; Nasopharyngeal(NP) swabs in vial transport medium  Result Value Ref Range Status   SARS Coronavirus 2 by RT PCR NEGATIVE NEGATIVE Final    Comment: (NOTE) SARS-CoV-2 target nucleic acids are NOT DETECTED.  The SARS-CoV-2 RNA is generally detectable in upper respiratory specimens during the acute phase  of infection. The lowest concentration of SARS-CoV-2 viral copies this assay can detect is 138 copies/mL. A negative result does not preclude SARS-Cov-2 infection and should not be used as the sole basis for treatment or other patient management decisions. A negative result may occur with  improper specimen collection/handling, submission of specimen other than nasopharyngeal swab, presence of viral mutation(s) within the areas targeted by this assay, and inadequate number of viral copies(<138 copies/mL). A negative result must be combined with clinical observations, patient history, and epidemiological information. The expected result is Negative.  Fact Sheet for Patients:  EntrepreneurPulse.com.au  Fact Sheet for Healthcare Providers:  IncredibleEmployment.be  This test is no t yet approved or cleared by the [place] FDA and  has been authorized for detection and/or diagnosis of SARS-CoV-2  by FDA under an Emergency Use Authorization (EUA). This EUA will remain  in effect (meaning this test can be used) for the duration of the COVID-19 declaration under Section 564(b)(1) of the Act, 21 U.S.C.section 360bbb-3(b)(1), unless the authorization is terminated  or revoked sooner.       Influenza A by PCR NEGATIVE NEGATIVE Final   Influenza B by PCR NEGATIVE NEGATIVE Final    Comment: (NOTE) The Xpert Xpress SARS-CoV-2/FLU/RSV plus assay is intended as an aid in the diagnosis of influenza from Nasopharyngeal swab specimens and should not be used as a sole basis for treatment. Nasal washings and aspirates are unacceptable for Xpert Xpress SARS-CoV-2/FLU/RSV testing.  Fact Sheet for Patients: EntrepreneurPulse.com.au  Fact Sheet for Healthcare Providers: IncredibleEmployment.be  This test is not yet approved or cleared by the [place] FDA and has been authorized for detection and/or diagnosis of SARS-CoV-2 by FDA under an Emergency Use Authorization (EUA). This EUA will remain in effect (meaning this test can be used) for the duration of the COVID-19 declaration under Section 564(b)(1) of the Act, 21 U.S.C. section 360bbb-3(b)(1), unless the authorization is terminated or revoked.  Performed at [facility], [address] [place], [place] [address]          Radiology Studies: DG Abd Portable 1V-Small Bowel Obstruction Protocol-initial, 8 hr delay  Result Date: 10/15/2020 CLINICAL DATA:  Bowel obstruction EXAM: PORTABLE ABDOMEN - 1 VIEW COMPARISON:  10/13/2020 radiograph and CT FINDINGS: Esophageal tube has been retracted, side-port projects over distal esophagus. Dilute contrast within dilated small bowel up to 5.7 cm. Slightly more dense appearing contrast within the right mid abdomen potentially within colon. IMPRESSION: 1. Esophageal tube side-port over the distal esophagus, recommend advancement by approximately  10 cm for more optimal positioning. 2. Dilute contrast within dilated small bowel within the central and left abdomen concerning for bowel obstruction. Small amount of more dense appearing radiopaque material or contrast in the right colon. These results will be called to the ordering clinician or representative by the Radiologist Assistant, and communication documented in the PACS or [employer]. Electronically Signed   By: Donavan Foil M.D.   On: 10/15/2020 21:58        Scheduled Meds: . bisacodyl  10 mg Rectal Daily  . chlorhexidine  15 mL Mouth Rinse BID  . Chlorhexidine Gluconate Cloth  6 each Topical Daily  . enoxaparin (LOVENOX) injection  40 mg Subcutaneous Q24H  . insulin aspart  0-9 Units Subcutaneous Q6H  . mouth rinse  15 mL Mouth Rinse q12n4p   Continuous Infusions: . sodium chloride Stopped (10/14/20 1855)  . TPN ADULT (ION) 80 mL/hr at 10/15/20 1754  LOS: 2 days   Time spent: 101min  Caidence Higashi C Chenee Munns, DO [facility]  If 7PM-7AM, please contact night-coverage [email or web address]  10/16/2020, 7:16 AM

## 2020-10-16 NOTE — Progress Notes (Signed)
PHARMACY - TOTAL PARENTERAL NUTRITION CONSULT NOTE   Indication: Small bowel obstruction  Patient Measurements: Height: 5\' 8"  (172.7 cm) Weight: 60.6 kg (133 lb 9.6 oz) IBW/kg (Calculated) : 63.9 TPN AdjBW (KG): 61.7 Body mass index is 20.31 kg/m.  Recent Labs    10/15/20 0526 10/16/20 0444  NA 138 140  K 3.8 4.4  CL 102 104  CO2 26 24  GLUCOSE 121* 142*  BUN 16 29*  CREATININE 0.56 0.58  CALCIUM 8.7* 9.2  PHOS 5.1* 3.9  MG 2.1 2.1  ALBUMIN 3.0* 2.9*  ALKPHOS 86 80  AST 16 16  ALT 13 13  BILITOT 0.7 0.5  TRIG 118 108  PREALBUMIN 12.2* 12.7*   Assessment:  75 y/o F with metastatic pancreatic cancer and recent colitis presented with vomiting and was found to have SBO. Orders received to begin TPN with pharmacy assistance.  Glucose / Insulin: no hx of DM. CBGs < 150. 3 units of SSI used in past 24hrs Electrolytes:  CoCa 10.08, Phos down to 3.9 after removed from TPN yesterday Renal:  SCr WNL,  BUN 29 Hepatic: LFTs WNL. Trig wnl. Prealbumin 11.7/12.2/12.7  Intake / Output; MIVF: NG output high at 2275 mL yesterday. Stool x 1 3/12 & 3/13, I/O -958.6 GI Imaging: 3/11: high-grade proximal SBO GI Surgeries / Procedures:  Central access: [facility] 08/07/20 TPN start date: 10/14/2020  Nutritional Goals  Kcal: 1800-2000 Protein 90-105 g Fluid > 1.8 L  MWF TPN - 28ml/hr with 105.6g of protein, 17% dextrose, 50g of lipids. This will give 2,032 kcal TTSS TPN - 11ml/hr, 105.6g of protein, 17% dextrose, no lipids. This will give 1,532 kcal. Kcal daily average = 1,746  Current Nutrition:  NPO, TPN  Plan:  continue TPN @ goal rate of 80 mL/hr Lipids MWF only due to national shortage Electrolytes in TPN: Na 56mEq/L, K 49mEq/L, Ca 81mEq/L, Mg 59mEq/L, add back 5 mEq/L Phos today. Cl:Ac 1:1 Add standard MVI daily and trace elements to TPN DC SSI & CBGs NS dc'd 3/12 by Avon Gully, pt didn't want new IV- may need to resume w/ high NGO Monitor TPN labs, BMET, Mg & Phos in  AM  Eudelia Bunch, Pharm.D 10/16/2020 7:35 AM

## 2020-10-16 NOTE — Progress Notes (Signed)
Chaplain responding to referral from RN  Initial visit - introducing spiritual care as resource.  Provided support with Joanne Chars and [name] at bedside.  They identified having a large family - Mata has 8 siblings, children and grandchildren.  They are hopeful to have folks be able to check in on Eriana during admission.   [name] asks about flexibility in visitation policy.   Spiritual care will work to find virtual resources for Mrs. Marylon to connect with folks in her family.  [name] states they have tried virtual visits, but notes he is not Pharmacologist.    Matt

## 2020-10-16 NOTE — Progress Notes (Signed)
Ms. Galer still has the NG tube in.  It is still draining quite a bit.  Her labs today show white cell count 13.7.  Hemoglobin 11.1.  Platelet count 354,000.  Her prealbumin is 12.7.  Her BUN is 29 creatinine 0.58.  There may be some ascites.  I will get a abdominal ultrasound and possible paracentesis.  It certainly would not surprise me if she had a peritoneal carcinomatosis.  This could certainly be the etiology of this bowel obstruction.  I really cannot hear a lot of bowel sounds.  I see that Dr. Brantley Stage of surgery has seen her.  Very much appreciate his input.  I would like to think that surgery would be the very last intervention that we would have to consider.  She is not having any cough or shortness of breath.  There is no bleeding.  She is urinating.  She is having some bowel movements.  Her vital signs are all stable.  Blood pressure 120/67.  Pulse is 65.  Temperature 97.8.  Her abdomen is soft.  Bowel sounds are decreased.  There is no guarding or rebound tenderness.  I really cannot palpate any obvious ascites.  Lungs are clear.  Cardiac exam regular rate and rhythm.  She is on TNA.  I really think this is a good idea for her.  I think this is definitely going to help her out.  At least she is getting some nutrition in.  I appreciate the wonderful care that she is getting from all the staff up on [address].  Lattie Haw, MD  1 Thessalonians 5:11

## 2020-10-16 NOTE — Progress Notes (Signed)
Progress Note     Subjective: Patient reports 1 BM overnight, no other bowel function and not passing flatus. Abdomen feels bloated at times. Intermittent abdominal pain. She has not been ambulating since she has been connected to wall suction by NGT. It sounds like whenever she is disconnected she has some worsening of abdominal pain.   Objective: Vital signs in last 24 hours: Temp:  [97.8 F (36.6 C)-98.1 F (36.7 C)] 97.8 F (36.6 C) (03/14 0540) Pulse Rate:  [65-94] 65 (03/14 0540) Resp:  [18] 18 (03/14 0540) BP: (122-136)/(64-67) 122/67 (03/14 0540) SpO2:  [92 %-97 %] 92 % (03/14 0540) Last BM Date: 10/14/20  Intake/Output from previous day: 03/13 0701 - 03/14 0700 In: 1316.4 [I.V.:1316.4] Out: 2275 [Emesis/NG output:2275] Intake/Output this shift: No intake/output data recorded.  PE: General: pleasant, WD, thin female who is sitting in bed in NAD Heart: regular, rate, and rhythm.   Lungs: CTAB, no wheezes, rhonchi, or rales noted.  Respiratory effort nonlabored Abd: soft, NT, mildly distended, BS hypoactive, NGT in place with bilious appearing drainage MS: all 4 extremities are symmetrical with no cyanosis, clubbing, or edema. Skin: warm and dry with no masses, lesions, or rashes Neuro: Cranial nerves 2-12 grossly intact, sensation is normal throughout Psych: A&Ox3 with an appropriate affect.    Lab Results:  Recent Labs    10/15/20 0526 10/16/20 0444  WBC 13.6* 13.7*  HGB 11.0* 11.1*  HCT 34.4* 35.7*  PLT 345 354   BMET Recent Labs    10/15/20 0526 10/16/20 0444  NA 138 140  K 3.8 4.4  CL 102 104  CO2 26 24  GLUCOSE 121* 142*  BUN 16 29*  CREATININE 0.56 0.58  CALCIUM 8.7* 9.2   PT/INR No results for input(s): LABPROT, INR in the last 72 hours. CMP     Component Value Date/Time   NA 140 10/16/2020 0444   NA 139 11/11/2016 1426   NA 142 08/30/2015 0941   K 4.4 10/16/2020 0444   K 4.1 11/11/2016 1426   K 4.9 08/30/2015 0941   CL 104  10/16/2020 0444   CL 103 11/11/2016 1426   CO2 24 10/16/2020 0444   CO2 28 11/11/2016 1426   CO2 27 08/30/2015 0941   GLUCOSE 142 (H) 10/16/2020 0444   GLUCOSE 95 11/11/2016 1426   BUN 29 (H) 10/16/2020 0444   BUN 12 11/11/2016 1426   BUN 11.8 08/30/2015 0941   CREATININE 0.58 10/16/2020 0444   CREATININE 0.69 10/11/2020 0941   CREATININE 0.9 11/11/2016 1426   CREATININE 1.0 08/30/2015 0941   CALCIUM 9.2 10/16/2020 0444   CALCIUM 9.4 11/11/2016 1426   CALCIUM 9.5 08/30/2015 0941   PROT 6.0 (L) 10/16/2020 0444   PROT 6.6 11/11/2016 1426   PROT 6.8 08/30/2015 0941   ALBUMIN 2.9 (L) 10/16/2020 0444   ALBUMIN 3.7 11/11/2016 1426   ALBUMIN 3.9 08/30/2015 0941   AST 16 10/16/2020 0444   AST 16 10/11/2020 0941   AST 19 08/30/2015 0941   ALT 13 10/16/2020 0444   ALT 12 10/11/2020 0941   ALT 20 11/11/2016 1426   ALT 13 08/30/2015 0941   ALKPHOS 80 10/16/2020 0444   ALKPHOS 65 11/11/2016 1426   ALKPHOS 62 08/30/2015 0941   BILITOT 0.5 10/16/2020 0444   BILITOT 0.6 10/11/2020 0941   BILITOT 1.25 (H) 08/30/2015 0941   GFRNONAA >60 10/16/2020 0444   GFRNONAA >60 10/11/2020 0941   GFRAA >60 03/10/2020 0851   Lipase  Component Value Date/Time   LIPASE 33 10/13/2020 1826       Studies/Results: DG Abd Portable 1V  Result Date: 10/16/2020 CLINICAL DATA:  Follow-up small bowel obstruction EXAM: PORTABLE ABDOMEN - 1 VIEW COMPARISON:  10/15/2020 FINDINGS: NG tube is advanced with the tip is in the stomach and side port at just below the junction. No dilated large or small bowel. Small amount of oral contrast in the ascending colon. Gas in the rectum. IMPRESSION: 1. NG tube in stomach. 2. No evidence of bowel obstruction Electronically Signed   By: Suzy Bouchard M.D.   On: 10/16/2020 09:58   DG Abd Portable 1V-Small Bowel Obstruction Protocol-initial, 8 hr delay  Result Date: 10/15/2020 CLINICAL DATA:  Bowel obstruction EXAM: PORTABLE ABDOMEN - 1 VIEW COMPARISON:  10/13/2020  radiograph and CT FINDINGS: Esophageal tube has been retracted, side-port projects over distal esophagus. Dilute contrast within dilated small bowel up to 5.7 cm. Slightly more dense appearing contrast within the right mid abdomen potentially within colon. IMPRESSION: 1. Esophageal tube side-port over the distal esophagus, recommend advancement by approximately 10 cm for more optimal positioning. 2. Dilute contrast within dilated small bowel within the central and left abdomen concerning for bowel obstruction. Small amount of more dense appearing radiopaque material or contrast in the right colon. These results will be called to the ordering clinician or representative by the Radiologist Assistant, and communication documented in the PACS or [employer]. Electronically Signed   By: Donavan Foil M.D.   On: 10/15/2020 21:58    Anti-infectives: Anti-infectives (From admission, onward)   None       Assessment/Plan Stage IV pancreatic cancer Malignant SBO - SBO protocol started yesterday and patient did have a BM but no further bowel function noted and still having high output from NGT - film this AM with contrast visualized in R colon and non-obstructive bowel gas pattern  - mobilize as able  - try to keep K around 4.0 and Mg around 2.0 - continue NGT to LIWS  - prealbumin 12.7, agree with TPN - recommend palliative consult to establish GOC - if patient fails to improve with conservative management and wishes to pursue surgical intervention - may require bowel resection which would require chemo to be on hold, might only be able to offer venting gastrostomy. Will discuss further with attending MD  FEN: NPO, TPN, NGT to LIWS VTE: lovenox ID: no current abx  LOS: 2 days    Norm Parcel , [facility] 10/16/2020, 10:22 AM Please see Amion for pager number during day hours 7:00am-4:30pm

## 2020-10-17 ENCOUNTER — Inpatient Hospital Stay (HOSPITAL_COMMUNITY): Payer: Medicare Other

## 2020-10-17 DIAGNOSIS — K56609 Unspecified intestinal obstruction, unspecified as to partial versus complete obstruction: Secondary | ICD-10-CM | POA: Diagnosis not present

## 2020-10-17 DIAGNOSIS — C7800 Secondary malignant neoplasm of unspecified lung: Secondary | ICD-10-CM | POA: Diagnosis not present

## 2020-10-17 DIAGNOSIS — C259 Malignant neoplasm of pancreas, unspecified: Secondary | ICD-10-CM | POA: Diagnosis not present

## 2020-10-17 DIAGNOSIS — C787 Secondary malignant neoplasm of liver and intrahepatic bile duct: Secondary | ICD-10-CM | POA: Diagnosis not present

## 2020-10-17 LAB — BASIC METABOLIC PANEL
Anion gap: 10 (ref 5–15)
BUN: 38 mg/dL — ABNORMAL HIGH (ref 8–23)
CO2: 23 mmol/L (ref 22–32)
Calcium: 8.9 mg/dL (ref 8.9–10.3)
Chloride: 107 mmol/L (ref 98–111)
Creatinine, Ser: 0.66 mg/dL (ref 0.44–1.00)
GFR, Estimated: >60 mL/min (ref >60)
Glucose, Bld: 138 mg/dL — ABNORMAL HIGH (ref 70–99)
Potassium: 4.7 mmol/L (ref 3.5–5.1)
Sodium: 140 mmol/L (ref 135–145)

## 2020-10-17 LAB — GLUCOSE, CAPILLARY
Glucose-Capillary: 116 mg/dL — ABNORMAL HIGH (ref 70–99)
Glucose-Capillary: 125 mg/dL — ABNORMAL HIGH (ref 70–99)
Glucose-Capillary: 129 mg/dL — ABNORMAL HIGH (ref 70–99)

## 2020-10-17 LAB — PHOSPHORUS: Phosphorus: 3.7 mg/dL (ref 2.5–4.6)

## 2020-10-17 LAB — MAGNESIUM: Magnesium: 2.2 mg/dL (ref 1.7–2.4)

## 2020-10-17 MED ORDER — MAGIC MOUTHWASH
15.0000 mL | Freq: Four times a day (QID) | ORAL | Status: DC
  Administered 2020-10-17 – 2020-10-18 (×5): 15 mL via ORAL
  Filled 2020-10-17 (×6): qty 15

## 2020-10-17 MED ORDER — TRAVASOL 10 % IV SOLN
INTRAVENOUS | Status: AC
  Filled 2020-10-17: qty 1056

## 2020-10-17 NOTE — Progress Notes (Signed)
This nurse notified pt's nurse, Stanton Kidney RN that [facility] is unable to draw labs from PORT d/t TPN infusing through PORT. Instructed to notify phlebotomy to draw pt labs. Fran Lowes, RN [facility]

## 2020-10-17 NOTE — Care Management Important Message (Signed)
Important Message  Patient Details IM Letter given to the Patient. Name: Tamara Powell MRN: [id number] Date of Birth: [birth year]   Medicare Important Message Given:  Yes     [name] 10/17/2020, 8:56 AM

## 2020-10-17 NOTE — Consult Note (Signed)
Palliative care consult note  Reason for consult: Goals of care in light of advanced pancreatic cancer  Palliative care consult received.  Chart reviewed including personal review of imaging.  Briefly, Tamara Powell is a very pleasant 75 year old female with past medical history of metastatic pancreatic cancer with lung and liver mets, CLL who was admitted with high-grade bowel obstruction.  She is status post NG tube placement and receiving TPN.  Has been evaluated by surgery and plan is currently for continuation of conservative interventions with hopes that her bowel obstruction resolves.  There is certainly high concern for complications and risk associated with any sort of surgical intervention.  She is followed by Dr. Marin Olp who is seeing her in the hospital.  Plan prior to admission was for intiation of new chemotherapy to begin next week.  Palliative consulted for goals of care.  I met today with Tamara Powell alone while her husband was out for a walk and then also in conjunction with her husband, Tamara Powell.  She reports that she has been married for 34 years and they have 3 boys.  We discussed losses she has suffered when other children have passed.  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  In the past she and her husband very much enjoyed golf, however, she is no longer able to participate in this due to decreased functional status.  Her faith is a big part of life and she attends [employer].  I introduced palliative care as specialized medical care for people living with serious illness. It focuses on providing relief from the symptoms and stress of a serious illness. The goal is to improve quality of life for both the patient and the family.  We discussed clinical course as well as wishes moving forward in regard to advanced directives.  Concepts specific to code status and rehospitalization discussed.  We discussed difference between a aggressive medical intervention  path and a palliative, comfort focused care path.  Values and goals of care important to patient and family were attempted to be elicited.  She is open to conversation regarding potential pathways moving forward and we discussed continued changes she has been seeing in her nutrition and functional status.  She becomes appropriately tearful when discussing how difficult it is to deal with diagnosis of advanced cancer but she remains upbeat and wanting to continue with current plan for aggressive interventions.  She and Tamara Powell will continue to discuss her care plan and any potential limitations of care moving forward.  She understands that in the event she is not able to make her own decisions that Tamara Powell will be making them on her behalf and it is important that he understand what her wishes are moving forward.  [name] expresses that he struggles with seeing Tamara Powell ill and he is trying to determine how he can best support his wife in light of her serious illness.  In regards to CODE STATUS and care plan, Tamara Powell reports that she is going to continue to consider these things based upon her clinical course of the next couple of days.  She is hopeful to improve and stabilize and be able to receive further disease modifying therapy.  She does understand that if bowel function does not return, there is a potential for surgical intervention which carries both risk and potential for further morbidity.  At this time, Tamara Powell is invested in continuation of any and all aggressive interventions.  She relies on Dr. Marin Olp to  help guide her in decision-making and very much appreciates his input on her medical matters.  We also discussed consideration for completion of advance directives and/or MOST form to outline her long-term wishes moving forward.  Questions and concerns addressed.   PMT will continue to support holistically.  At this time, she is invested in plan for continuation of all aggressive  interventions.  We did have discussion regarding CODE STATUS and advanced care planning and she and Tamara Powell will continue to discuss long-term wishes moving forward.  While I am going off service, I will ask another member of the palliative care team to follow-up with Tamara Powell in the next couple of days.  Please call if there are immediate needs with which we can be of assistance in the interim.  Start time: 1650 End time:1810 Total time: 80 minutes  Greater than 50%  of this time was spent counseling and coordinating care related to the above assessment and plan.  Micheline Rough, MD [facility] [phone]

## 2020-10-17 NOTE — Progress Notes (Signed)
PHARMACY - TOTAL PARENTERAL NUTRITION CONSULT NOTE   Indication: Small bowel obstruction  Patient Measurements: Height: 5\' 8"  (172.7 cm) Weight: 60.6 kg (133 lb 9.6 oz) IBW/kg (Calculated) : 63.9 TPN AdjBW (KG): 61.7 Body mass index is 20.31 kg/m.  Recent Labs    10/15/20 0526 10/16/20 0444 10/17/20 0453  NA 138 140 140  K 3.8 4.4 4.7  CL 102 104 107  CO2 26 24 23   GLUCOSE 121* 142* 138*  BUN 16 29* 38*  CREATININE 0.56 0.58 0.66  CALCIUM 8.7* 9.2 8.9  PHOS 5.1* 3.9 3.7  MG 2.1 2.1 2.2  ALBUMIN 3.0* 2.9*  --   ALKPHOS 86 80  --   AST 16 16  --   ALT 13 13  --   BILITOT 0.7 0.5  --   TRIG 118 108  --   PREALBUMIN 12.2* 12.7*  --    Assessment:  75 y/o F with metastatic pancreatic cancer and recent colitis presented with vomiting and was found to have SBO. Orders received to begin TPN with pharmacy assistance.  Glucose / Insulin: no hx of DM. CBGs < 150. 0 units of SSI used in past 24hrs Electrolytes:  WNL except K rising over the last 3 days (3.8 > 4.4 > 4.7) Renal:  SCr WNL,  BUN 38 Hepatic: LFTs WNL. Trig wnl. Prealbumin 11.7/12.2/12.7  Intake / Output; MIVF: NG output high at 2275 mL yesterday. Stool x 1 3/12 & 3/13, I/O -958.6 GI Imaging: 3/11: high-grade proximal SBO GI Surgeries / Procedures:  Central access: [facility] 08/07/20 TPN start date: 10/14/2020  Nutritional Goals  Kcal: 1800-2000 Protein 90-105 g Fluid > 1.8 L  MWF TPN - 6ml/hr with 105.6g of protein, 17% dextrose, 50g of lipids. This will give 2,032 kcal TTSS TPN - 54ml/hr, 105.6g of protein, 17% dextrose, no lipids. This will give 1,532 kcal. Kcal daily average = 1,746  Current Nutrition:  NPO, TPN  Plan:  Continue TPN @ goal rate of 80 mL/hr Lipids MWF only due to national shortage Electrolytes in TPN: Na 82mEq/L, K 60mEq/L, Ca 54mEq/L, Mg 37mEq/L, add back 5 mEq/L Phos today. Cl:Ac 1:1 Add standard MVI daily and trace elements to TPN DC SSI & CBGs NS dc'd 3/12 by Avon Gully, pt didn't want  new IV- may need to resume w/ high NGO Monitor TPN labs, BMET, Mg & Phos as needed   Adrian Saran, PharmD, BCPS Phone number per [email or web address]  10/17/2020 9:12 AM

## 2020-10-17 NOTE — Progress Notes (Signed)
Patient sitting up vomiting and experiencing nausea on assessment. 700 ml in suction canister from the NG tube. NG tube not clamped due to vomiting and nausea. Patient given Zofran, PRN med for nausea. Will continue to monitor.  Wynona Neat, RN

## 2020-10-17 NOTE — Progress Notes (Signed)
PROGRESS NOTE    Tamara Powell  EQA:[id number] DOB: 1946-04-24 DOA: 10/13/2020 PCP: Tamara Sheriff, Tamara Powell   Brief Narrative:  75 year old female with past medical history of pancreatic cancer with mets to liver (follows with Dr. Marin Olp) presenting to [place] emergency department with 2-day history of abdominal pain nausea and vomiting. Of note, patient presented to [facility] 1 week ago with similar symptoms, was admitted for 3 days, diagnosed with colitis and discharged with antibiotics. Patient now presents with recurrent abdominal pain nausea and vomiting. CT imaging of the abdomen reveals high-grade small bowel obstruction. Case discussed with Dr. Brantley Stage with general surgery who recommends hospitalization - general surgery and oncology to follow.Outpatient plan to start new chemotherapy in the next week. 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 Marin Olp, IV fluids ongoing as well in setting of n.p.o. status  Pancreatic cancer Questionable carcinomatosis/ascites -Concern for carcinomatosis as well as metastatic disease given above; paracentesis discussed with patient from oncology standpoint, no real abdominal distention or pain today, unclear if paracentesis would be successful -Continue current regimen, tentative plan to change to new  chemotherapy next week with Dr. Marin Olp oncology, defer to their expertise on whether or not this will need to be delayed in the setting of current acute small bowel obstruction  Goals of care -Palliative care following, given patient's known history of pancreatic cancer with acute issue in the setting of small bowel obstruction; patient remains high risk for increased morbidity mortality -Hopefully patient's small bowel obstruction continues to improve as above, at this point given patient's diagnosis and oncology's attempt to change chemotherapies indicates that a long-term goal of plan should be in place given expectations in the setting of pancreatic cancer  DVT prophylaxis: Lovenox Code Status: Full Family Communication: Husband and friends at bedside  Status is: Inpatient  Dispo: The patient is from: Home              Anticipated d/c is to: Home              Anticipated d/c date is:48-72 hours pending clinical course              Patient currently not medically stable for discharge  Consultants:   General surgery, oncology  Procedures:   None  Antimicrobials:  None indicated  Subjective: No acute issues or events overnight, patient indicates multiple small bowel movements and flatus over the past day, continues to request advancement of diet, currently awaiting NG clamping trial although clear liquids would certainly be reasonable if surgery agrees if she does well with clamping trial.  Otherwise patient requesting to increase ambulation which is certainly reasonable, encouraged husband at bedside to walk with patient as much as she can tolerate today.  Objective: Vitals:   10/16/20 0540 10/16/20 1300 10/16/20 2122 10/17/20 0505  BP: 122/67 (!) 125/57 125/73 125/64  Pulse: 65 93 96 96  Resp:  18 17 18 20   Temp: 97.8 F (36.6 C) (!) 97.5 F (36.4 C) 98.3 F (36.8 C) 98.4 F (36.9 C)  TempSrc: Oral Oral Oral Oral  SpO2: 92% 96% 98% 96%  Weight:      Height:         Intake/Output Summary (Last 24 hours) at 10/17/2020 0810 Last data filed at 10/17/2020 0700 Gross per 24 hour  Intake 761.07 ml  Output 1300 ml  Net -538.93 ml   Filed Weights   10/13/20 1726 10/14/20 0050  Weight: 61.7 kg 60.6 kg    Examination:  General:  Pleasantly resting in bed, No acute distress. HEENT: NG tube clamped containing dark green fluid, normocephalic atraumatic.  Sclerae nonicteric, noninjected.  Extraocular movements intact bilaterally. Neck:  Without mass or deformity.  Trachea is midline. Lungs:  Clear to auscultate bilaterally without rhonchi, wheeze, or rales. Heart:  Regular rate and rhythm.  Without murmurs, rubs, or gallops. Abdomen:  Soft, minimally tender diffusely, nondistended.  Without guarding or rebound. Extremities: Without cyanosis, clubbing, edema, or obvious deformity. Vascular:  Dorsalis pedis and posterior tibial pulses palpable bilaterally. Skin:  Warm and dry, no erythema, no ulcerations.  Data Reviewed: I have personally reviewed following labs and imaging studies  CBC: Recent Labs  Lab 10/11/20 0941 10/13/20 1826 10/14/20 0944 10/15/20 0526 10/16/20 0444  WBC 15.8* 16.8* 12.3* 13.6* 13.7*  NEUTROABS 3.6 4.0  --  3.6 6.2  HGB 11.5* 12.1 10.5* 11.0* 11.1*  HCT 34.8* 36.5 32.6* 34.4* 35.7*  MCV 92.6 92.9 95.6 95.8 97.3  PLT 319 385 299 345 631   Basic Metabolic Panel: Recent Labs  Lab 10/13/20 1826 10/14/20 0944 10/15/20 0526 10/16/20 0444 10/17/20 0453  NA 134* 136 138 140 140  K 4.2 3.8 3.8 4.4 4.7  CL 97* 101 102 104 107  CO2 24 26 26 24 23   GLUCOSE 123* 102* 121* 142* 138*  BUN 10 11 16  29* 38*  CREATININE 0.74 0.49 0.56 0.58 0.66  CALCIUM 9.0 8.7* 8.7* 9.2 8.9  MG  --   --  2.1 2.1 2.2  PHOS  --   --  5.1* 3.9 3.7   GFR: Estimated Creatinine Clearance: 58.1 mL/min (by C-G formula based on SCr of 0.66 mg/dL). Liver Function Tests: Recent Labs  Lab 10/11/20 0941 10/13/20 1826 10/14/20 0944 10/15/20 0526  10/16/20 0444  AST 16 18 15 16 16   ALT 12 14 14 13 13   ALKPHOS 113 98 84 86 80  BILITOT 0.6 0.6 0.8 0.7 0.5  PROT 6.1* 6.3* 5.7* 5.8* 6.0*  ALBUMIN 3.6 3.1* 2.8* 3.0* 2.9*   Recent Labs  Lab 10/13/20 1826  LIPASE 33   No results for input(s): AMMONIA in the last 168 hours. Coagulation Profile: No results for input(s): INR, PROTIME in the last 168 hours. Cardiac Enzymes: No results for input(s): CKTOTAL, CKMB, CKMBINDEX, TROPONINI in the last 168 hours. BNP (last 3 results) No results for input(s): PROBNP in the last 8760 hours. HbA1C: No results for input(s): HGBA1C in the last 72 hours. CBG: Recent Labs  Lab 10/15/20 1737 10/15/20 2349 10/16/20 0510 10/16/20 1158 10/16/20 1657  GLUCAP 125* 149* 138* 146* 144*   Lipid Profile: Recent Labs    10/15/20 0526 10/16/20 0444  TRIG 118 108   Thyroid Function Tests: No results for input(s): TSH, T4TOTAL, FREET4, T3FREE, THYROIDAB in the last 72 hours. Anemia Panel: No results for input(s): VITAMINB12, FOLATE, FERRITIN, TIBC, IRON, RETICCTPCT in the last 72 hours. Sepsis  Labs: Recent Labs  Lab 10/13/20 2125  LATICACIDVEN 0.5    Recent Results (from the past 240 hour(s))  Resp Panel by RT-PCR (Flu A&B, Covid) Nasopharyngeal Swab     Status: None   Collection Time: 10/13/20 10:02 PM   Specimen: Nasopharyngeal Swab; Nasopharyngeal(NP) swabs in vial transport medium  Result Value Ref Range Status   SARS Coronavirus 2 by RT PCR NEGATIVE NEGATIVE Final    Comment: (NOTE) SARS-CoV-2 target nucleic acids are NOT DETECTED.  The SARS-CoV-2 RNA is generally detectable in upper respiratory specimens during the acute phase of infection. The lowest concentration of SARS-CoV-2 viral copies this assay can detect is 138 copies/mL. A negative result does not preclude SARS-Cov-2 infection and should not be used as the sole basis for treatment or other patient management decisions. A negative result may occur with  improper  specimen collection/handling, submission of specimen other than nasopharyngeal swab, presence of viral mutation(s) within the areas targeted by this assay, and inadequate number of viral copies(<138 copies/mL). A negative result must be combined with clinical observations, patient history, and epidemiological information. The expected result is Negative.  Fact Sheet for Patients:  EntrepreneurPulse.com.au  Fact Sheet for Healthcare Providers:  IncredibleEmployment.be  This test is no t yet approved or cleared by the [place] FDA and  has been authorized for detection and/or diagnosis of SARS-CoV-2 by FDA under an Emergency Use Authorization (EUA). This EUA will remain  in effect (meaning this test can be used) for the duration of the COVID-19 declaration under Section 564(b)(1) of the Act, 21 U.S.C.section 360bbb-3(b)(1), unless the authorization is terminated  or revoked sooner.       Influenza A by PCR NEGATIVE NEGATIVE Final   Influenza B by PCR NEGATIVE NEGATIVE Final    Comment: (NOTE) The Xpert Xpress SARS-CoV-2/FLU/RSV plus assay is intended as an aid in the diagnosis of influenza from Nasopharyngeal swab specimens and should not be used as a sole basis for treatment. Nasal washings and aspirates are unacceptable for Xpert Xpress SARS-CoV-2/FLU/RSV testing.  Fact Sheet for Patients: EntrepreneurPulse.com.au  Fact Sheet for Healthcare Providers: IncredibleEmployment.be  This test is not yet approved or cleared by the [place] FDA and has been authorized for detection and/or diagnosis of SARS-CoV-2 by FDA under an Emergency Use Authorization (EUA). This EUA will remain in effect (meaning this test can be used) for the duration of the COVID-19 declaration under Section 564(b)(1) of the Act, 21 U.S.C. section 360bbb-3(b)(1), unless the authorization is terminated or revoked.  Performed at  [facility], [address] [place], [place] [address]          Radiology Studies: DG Abd Portable 1V  Result Date: 10/16/2020 CLINICAL DATA:  Follow-up small bowel obstruction EXAM: PORTABLE ABDOMEN - 1 VIEW COMPARISON:  10/15/2020 FINDINGS: NG tube is advanced with the tip is in the stomach and side port at just below the junction. No dilated large or small bowel. Small amount of oral contrast in the ascending colon. Gas in the rectum. IMPRESSION: 1. NG tube in stomach. 2. No evidence of bowel obstruction Electronically Signed   By: Suzy Bouchard M.D.   On: 10/16/2020 09:58   DG Abd Portable 1V-Small Bowel Obstruction Protocol-initial, 8 hr delay  Result Date: 10/15/2020 CLINICAL DATA:  Bowel obstruction EXAM: PORTABLE ABDOMEN - 1 VIEW COMPARISON:  10/13/2020 radiograph and CT FINDINGS: Esophageal tube has been retracted, side-port projects over distal esophagus. Dilute contrast within dilated small bowel up to 5.7 cm.  Slightly more dense appearing contrast within the right mid abdomen potentially within colon. IMPRESSION: 1. Esophageal tube side-port over the distal esophagus, recommend advancement by approximately 10 cm for more optimal positioning. 2. Dilute contrast within dilated small bowel within the central and left abdomen concerning for bowel obstruction. Small amount of more dense appearing radiopaque material or contrast in the right colon. These results will be called to the ordering clinician or representative by the Radiologist Assistant, and communication documented in the PACS or [employer]. Electronically Signed   By: Donavan Foil M.D.   On: 10/15/2020 21:58        Scheduled Meds: . bisacodyl  10 mg Rectal Daily  . chlorhexidine  15 mL Mouth Rinse BID  . Chlorhexidine Gluconate Cloth  6 each Topical Daily  . enoxaparin (LOVENOX) injection  40 mg Subcutaneous Q24H  . mouth rinse  15 mL Mouth Rinse q12n4p   Continuous Infusions: . TPN ADULT  (ION) 80 mL/hr at 10/16/20 1728     LOS: 3 days   Time spent: 35min  William C Lancaster, DO [facility]  If 7PM-7AM, please contact night-coverage [email or web address]  10/17/2020, 8:10 AM

## 2020-10-17 NOTE — Progress Notes (Signed)
It still looks like there is a significant bowel obstruction.  She I think had the NG tube clamped a little bit yesterday so she will walk and had a lot of vomiting.  Palliative care has seen her.  As always, their input is very appreciative.  There is no x-ray yet today.  She is not complaining of any abdominal pain.  She still has quite a bit of drainage from the NG tube.  She has had no problems with pain.  There is no bleeding.  There is no cough or shortness of breath.  Her labs today show sodium 140.  Potassium 4.7.  Glucose 138.  Her BUN is 38 creatinine 0.66.  She is on TNA which I am sure is helping quite a bit.  Her vital signs are temperature of 98.4.  Pulse 96.  Blood pressure 125/64.  Oxygen saturations 96%.  Her lungs are clear.  Cardiac exam regular rate and rhythm.  Abdomen is soft.  There might be a few bowel sounds.  There is no abdominal distention.  There is no guarding or rebound tenderness.  I still worry that she has a complete obstruction.  I wonder if there is any role for upper endoscopy looking to see if they can find where the obstruction is and maybe put a stent in.  Again, I do not know if this is possible.  I still would like to avoid surgery on her.  However, this might be necessary if she does not have relief of this obstruction.  I will have to check her prealbumin to see how this looks.  I know that she is getting outstanding care from all the staff up on [address].  I appreciate all their help and all of their compassion  Lattie Haw, MD  Psalm 34: 17

## 2020-10-17 NOTE — Plan of Care (Signed)
  Problem: Clinical Measurements: Goal: Ability to maintain clinical measurements within normal limits will improve Outcome: Progressing   

## 2020-10-17 NOTE — Progress Notes (Signed)
Progress Note     Subjective: Patient had another BM yesterday afternoon. She did have some emesis around the NGT when getting up to walk. Not passing any flatus.   Objective: Vital signs in last 24 hours: Temp:  [97.5 F (36.4 C)-98.4 F (36.9 C)] 98.4 F (36.9 C) (03/15 0505) Pulse Rate:  [93-96] 96 (03/15 0505) Resp:  [17-20] 20 (03/15 0505) BP: (125)/(57-73) 125/64 (03/15 0505) SpO2:  [96 %-98 %] 96 % (03/15 0505) Last BM Date: 10/16/20 (per patient report)  Intake/Output from previous day: 03/14 0701 - 03/15 0700 In: 761.1 [I.V.:761.1] Out: 1300 [Emesis/NG output:1300] Intake/Output this shift: No intake/output data recorded.  PE: General: pleasant, WD, thin female who is sitting in bed in NAD Heart: regular, rate, and rhythm.   Lungs: CTAB, no wheezes, rhonchi, or rales noted.  Respiratory effort nonlabored Abd: soft, NT, less distended today, BS hypoactive, NGT in place with bilious appearing drainage MS: all 4 extremities are symmetrical with no cyanosis, clubbing, or edema. Skin: warm and dry with no masses, lesions, or rashes Neuro: Cranial nerves 2-12 grossly intact, sensation is normal throughout Psych: A&Ox3 with an appropriate affect.    Lab Results:  Recent Labs    10/15/20 0526 10/16/20 0444  WBC 13.6* 13.7*  HGB 11.0* 11.1*  HCT 34.4* 35.7*  PLT 345 354   BMET Recent Labs    10/16/20 0444 10/17/20 0453  NA 140 140  K 4.4 4.7  CL 104 107  CO2 24 23  GLUCOSE 142* 138*  BUN 29* 38*  CREATININE 0.58 0.66  CALCIUM 9.2 8.9   PT/INR No results for input(s): LABPROT, INR in the last 72 hours. CMP     Component Value Date/Time   NA 140 10/17/2020 0453   NA 139 11/11/2016 1426   NA 142 08/30/2015 0941   K 4.7 10/17/2020 0453   K 4.1 11/11/2016 1426   K 4.9 08/30/2015 0941   CL 107 10/17/2020 0453   CL 103 11/11/2016 1426   CO2 23 10/17/2020 0453   CO2 28 11/11/2016 1426   CO2 27 08/30/2015 0941   GLUCOSE 138 (H) 10/17/2020 0453    GLUCOSE 95 11/11/2016 1426   BUN 38 (H) 10/17/2020 0453   BUN 12 11/11/2016 1426   BUN 11.8 08/30/2015 0941   CREATININE 0.66 10/17/2020 0453   CREATININE 0.69 10/11/2020 0941   CREATININE 0.9 11/11/2016 1426   CREATININE 1.0 08/30/2015 0941   CALCIUM 8.9 10/17/2020 0453   CALCIUM 9.4 11/11/2016 1426   CALCIUM 9.5 08/30/2015 0941   PROT 6.0 (L) 10/16/2020 0444   PROT 6.6 11/11/2016 1426   PROT 6.8 08/30/2015 0941   ALBUMIN 2.9 (L) 10/16/2020 0444   ALBUMIN 3.7 11/11/2016 1426   ALBUMIN 3.9 08/30/2015 0941   AST 16 10/16/2020 0444   AST 16 10/11/2020 0941   AST 19 08/30/2015 0941   ALT 13 10/16/2020 0444   ALT 12 10/11/2020 0941   ALT 20 11/11/2016 1426   ALT 13 08/30/2015 0941   ALKPHOS 80 10/16/2020 0444   ALKPHOS 65 11/11/2016 1426   ALKPHOS 62 08/30/2015 0941   BILITOT 0.5 10/16/2020 0444   BILITOT 0.6 10/11/2020 0941   BILITOT 1.25 (H) 08/30/2015 0941   GFRNONAA >60 10/17/2020 0453   GFRNONAA >60 10/11/2020 0941   GFRAA >60 03/10/2020 0851   Lipase     Component Value Date/Time   LIPASE 33 10/13/2020 1826       Studies/Results: DG Abd Portable 1V  Result Date: 10/16/2020 CLINICAL DATA:  Follow-up small bowel obstruction EXAM: PORTABLE ABDOMEN - 1 VIEW COMPARISON:  10/15/2020 FINDINGS: NG tube is advanced with the tip is in the stomach and side port at just below the junction. No dilated large or small bowel. Small amount of oral contrast in the ascending colon. Gas in the rectum. IMPRESSION: 1. NG tube in stomach. 2. No evidence of bowel obstruction Electronically Signed   By: Suzy Bouchard M.D.   On: 10/16/2020 09:58   DG Abd Portable 1V-Small Bowel Obstruction Protocol-initial, 8 hr delay  Result Date: 10/15/2020 CLINICAL DATA:  Bowel obstruction EXAM: PORTABLE ABDOMEN - 1 VIEW COMPARISON:  10/13/2020 radiograph and CT FINDINGS: Esophageal tube has been retracted, side-port projects over distal esophagus. Dilute contrast within dilated small bowel up to 5.7  cm. Slightly more dense appearing contrast within the right mid abdomen potentially within colon. IMPRESSION: 1. Esophageal tube side-port over the distal esophagus, recommend advancement by approximately 10 cm for more optimal positioning. 2. Dilute contrast within dilated small bowel within the central and left abdomen concerning for bowel obstruction. Small amount of more dense appearing radiopaque material or contrast in the right colon. These results will be called to the ordering clinician or representative by the Radiologist Assistant, and communication documented in the PACS or [employer]. Electronically Signed   By: Donavan Foil M.D.   On: 10/15/2020 21:58   DG Abd Portable 2V  Result Date: 10/17/2020 CLINICAL DATA:  Small bowel obstruction. EXAM: PORTABLE ABDOMEN - 2 VIEW COMPARISON:  October 16, 2020. FINDINGS: The bowel gas pattern is normal. Distal tip of nasogastric tube is seen in proximal stomach. Residual contrast is seen in the colon. There is no evidence of free air. No radio-opaque calculi or other significant radiographic abnormality is seen. IMPRESSION: No evidence of bowel obstruction or ileus. Electronically Signed   By: Marijo Conception M.D.   On: 10/17/2020 08:22    Anti-infectives: Anti-infectives (From admission, onward)   None       Assessment/Plan Stage IV pancreatic cancer Malignant SBO - patient has had a few BMs and films without obstruction or ileus this AM - can trial NGT clamping and allow sips of clears, if patient becomes more distended or nauseated reconnect to LIWS - mobilize as able  - try to keep K around 4.0 and Mg around 2.0 - prealbumin 12.7, agree with TPN - appreciate palliative following for GOC - Pt wishes to pursue aggressive care for now - If patient fails to improve with conservative management, the only real option would be a venting gastrostomy tube for palliation. This could potentially be placed by IR vs surgically if needed.  FEN:  ice chips and sips, TPN, NGT clamping trials VTE: lovenox ID: no current abx  LOS: 3 days    Norm Parcel , [facility] 10/17/2020, 10:16 AM Please see Amion for pager number during day hours 7:00am-4:30pm

## 2020-10-18 ENCOUNTER — Inpatient Hospital Stay: Payer: Medicare Other

## 2020-10-18 ENCOUNTER — Inpatient Hospital Stay (HOSPITAL_COMMUNITY): Payer: Medicare Other

## 2020-10-18 ENCOUNTER — Encounter: Payer: Self-pay | Admitting: *Deleted

## 2020-10-18 ENCOUNTER — Encounter (HOSPITAL_COMMUNITY): Payer: Self-pay | Admitting: Internal Medicine

## 2020-10-18 DIAGNOSIS — C786 Secondary malignant neoplasm of retroperitoneum and peritoneum: Principal | ICD-10-CM

## 2020-10-18 LAB — CBC WITH DIFFERENTIAL/PLATELET
Abs Immature Granulocytes: 0.04 10*3/uL (ref 0.00–0.07)
Basophils Absolute: 0 10*3/uL (ref 0.0–0.1)
Basophils Relative: 0 %
Eosinophils Absolute: 0.1 10*3/uL (ref 0.0–0.5)
Eosinophils Relative: 1 %
HCT: 35.3 % — ABNORMAL LOW (ref 36.0–46.0)
Hemoglobin: 11.1 g/dL — ABNORMAL LOW (ref 12.0–15.0)
Immature Granulocytes: 0 %
Lymphocytes Relative: 61 %
Lymphs Abs: 9.9 10*3/uL — ABNORMAL HIGH (ref 0.7–4.0)
MCH: 31.1 pg (ref 26.0–34.0)
MCHC: 31.4 g/dL (ref 30.0–36.0)
MCV: 98.9 fL (ref 80.0–100.0)
Monocytes Absolute: 0.2 10*3/uL (ref 0.1–1.0)
Monocytes Relative: 1 %
Neutro Abs: 6.1 10*3/uL (ref 1.7–7.7)
Neutrophils Relative %: 37 %
Platelets: 361 10*3/uL (ref 150–400)
RBC: 3.57 MIL/uL — ABNORMAL LOW (ref 3.87–5.11)
RDW: 17.3 % — ABNORMAL HIGH (ref 11.5–15.5)
WBC: 16.4 10*3/uL — ABNORMAL HIGH (ref 4.0–10.5)
nRBC: 0 % (ref 0.0–0.2)

## 2020-10-18 LAB — COMPREHENSIVE METABOLIC PANEL
ALT: 27 U/L (ref 0–44)
AST: 30 U/L (ref 15–41)
Albumin: 2.9 g/dL — ABNORMAL LOW (ref 3.5–5.0)
Alkaline Phosphatase: 104 U/L (ref 38–126)
Anion gap: 11 (ref 5–15)
BUN: 37 mg/dL — ABNORMAL HIGH (ref 8–23)
CO2: 21 mmol/L — ABNORMAL LOW (ref 22–32)
Calcium: 8.9 mg/dL (ref 8.9–10.3)
Chloride: 107 mmol/L (ref 98–111)
Creatinine, Ser: 0.56 mg/dL (ref 0.44–1.00)
GFR, Estimated: >60 mL/min (ref >60)
Glucose, Bld: 136 mg/dL — ABNORMAL HIGH (ref 70–99)
Potassium: 4 mmol/L (ref 3.5–5.1)
Sodium: 139 mmol/L (ref 135–145)
Total Bilirubin: 0.8 mg/dL (ref 0.3–1.2)
Total Protein: 6 g/dL — ABNORMAL LOW (ref 6.5–8.1)

## 2020-10-18 LAB — MAGNESIUM: Magnesium: 1.9 mg/dL (ref 1.7–2.4)

## 2020-10-18 LAB — PHOSPHORUS: Phosphorus: 3.6 mg/dL (ref 2.5–4.6)

## 2020-10-18 LAB — GLUCOSE, CAPILLARY: Glucose-Capillary: 131 mg/dL — ABNORMAL HIGH (ref 70–99)

## 2020-10-18 LAB — PREALBUMIN: Prealbumin: 13.4 mg/dL — ABNORMAL LOW (ref 18–38)

## 2020-10-18 MED ORDER — TRAVASOL 10 % IV SOLN
INTRAVENOUS | Status: AC
  Filled 2020-10-18: qty 1056

## 2020-10-18 MED ORDER — MAGIC MOUTHWASH
15.0000 mL | ORAL | Status: DC | PRN
  Administered 2020-10-18 – 2020-10-20 (×7): 15 mL via ORAL
  Filled 2020-10-18 (×8): qty 15

## 2020-10-18 NOTE — Progress Notes (Signed)
Unfortunately, we are not in position where the bowel obstruction has resolved.  However, she still is having vomiting.  Whenever I listen to her abdomen, I really cannot hear any bowel sounds.  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  She still has the NG tube in.  It is currently not connected to the suction.  I think we are now in a position where we have to consider changing the focus of her care.  I talked her about all this.  I really believe that she is going to need Hospice at this point.  Whenever we see malignant pseudoobstruction, there is absolutely no way to resolve this.  Treating the cancer does not help.  Our goal is to make sure that she has some form of drainage.  I think that a PEG tube needs to be placed so that she can have drainage through the stomach and then have the NG tube removed.  We talked about her prognosis.  She wanted to know how long I thought she had.  Given that she was not going be able to eat, I suspect that she has less than 4 weeks.  She understood this.  We talked about the end-of-life issues.  She DOES NOT want any interventions to be made that are heroic.  I totally agree with this.  As such, she is a DO NOT RESUSCITATE.  She lives down in [place].  We will need to get [facility] to help out when she is discharged.  I just feel bad for Ms. Wailes.  I know that she has tried as hard as possible.  She has done everything we have asked her to do.  Yet, we now are dealing with a situation that we have no resolution of.  I just want her to have respect comfort and dignity.  She wants that also.  I believe that Hospice will definitely be the way to go.  We will see if interventional radiology can place a PEG tube that we can use for drainage.  This way, the NG tube can come out.  She has a sore throat from the NG tube.  Again, we have to focus on her  comfort at this point.  She understands what the future holds.  She just wants to be able to go home.  I know that Hospice will do a great job with her.  If she cannot be cared for at home, then we can always see about the [place] [facility] in [facility].  She is on TNA.  I would go ahead and stop this once we know she is going to be discharged.  This is at least providing some nutrition for her so that she will have a little bit of extra time with her family once she is discharged.  Lattie Haw, MD  2 Timothy 4:6-8

## 2020-10-18 NOTE — Progress Notes (Signed)
Progress Note     Subjective: Patient with worsening abdominal pain bloating and nausea with clamping trial.  Placed back to suction.  Has very sore throat and is hoarse from an NG tube.  Husband at bedside.  Had already had discussion with medical oncology about transitioning to hospice.  Objective: Vital signs in last 24 hours: Temp:  [97.5 F (36.4 C)-98.6 F (37 C)] 97.9 F (36.6 C) (03/16 0451) Pulse Rate:  [98-104] 99 (03/16 0451) Resp:  [16-22] 20 (03/16 0451) BP: (118-123)/(57-71) 123/62 (03/16 0451) SpO2:  [97 %-98 %] 97 % (03/16 0451) Last BM Date: 10/17/20  Intake/Output from previous day: 03/15 0701 - 03/16 0700 In: 2084.6 [P.O.:240; I.V.:1844.6] Out: 1150 [Emesis/NG output:1150] Intake/Output this shift: No intake/output data recorded.  PE: General: pleasant, WD, thin female who is sitting in bed in NAD Heart: regular, rate, and rhythm.   Lungs: CTAB, no wheezes, rhonchi, or rales noted.  Respiratory effort nonlabored Abd: soft, NT, moderately distended today, BS hypoactive, NGT in place with bilious appearing drainage MS: all 4 extremities are symmetrical with no cyanosis, clubbing, or edema. Skin: warm and dry with no masses, lesions, or rashes Neuro: Cranial nerves 2-12 grossly intact, sensation is normal throughout Psych: A&Ox3 with an appropriate affect.  Anxious and tearful but consolable  Lab Results:  Recent Labs    10/16/20 0444 10/18/20 0454  WBC 13.7* 16.4*  HGB 11.1* 11.1*  HCT 35.7* 35.3*  PLT 354 361   BMET Recent Labs    10/17/20 0453 10/18/20 0454  NA 140 139  K 4.7 4.0  CL 107 107  CO2 23 21*  GLUCOSE 138* 136*  BUN 38* 37*  CREATININE 0.66 0.56  CALCIUM 8.9 8.9   PT/INR No results for input(s): LABPROT, INR in the last 72 hours. CMP     Component Value Date/Time   NA 139 10/18/2020 0454   NA 139 11/11/2016 1426   NA 142 08/30/2015 0941   K 4.0 10/18/2020 0454   K 4.1 11/11/2016 1426   K 4.9 08/30/2015 0941    CL 107 10/18/2020 0454   CL 103 11/11/2016 1426   CO2 21 (L) 10/18/2020 0454   CO2 28 11/11/2016 1426   CO2 27 08/30/2015 0941   GLUCOSE 136 (H) 10/18/2020 0454   GLUCOSE 95 11/11/2016 1426   BUN 37 (H) 10/18/2020 0454   BUN 12 11/11/2016 1426   BUN 11.8 08/30/2015 0941   CREATININE 0.56 10/18/2020 0454   CREATININE 0.69 10/11/2020 0941   CREATININE 0.9 11/11/2016 1426   CREATININE 1.0 08/30/2015 0941   CALCIUM 8.9 10/18/2020 0454   CALCIUM 9.4 11/11/2016 1426   CALCIUM 9.5 08/30/2015 0941   PROT 6.0 (L) 10/18/2020 0454   PROT 6.6 11/11/2016 1426   PROT 6.8 08/30/2015 0941   ALBUMIN 2.9 (L) 10/18/2020 0454   ALBUMIN 3.7 11/11/2016 1426   ALBUMIN 3.9 08/30/2015 0941   AST 30 10/18/2020 0454   AST 16 10/11/2020 0941   AST 19 08/30/2015 0941   ALT 27 10/18/2020 0454   ALT 12 10/11/2020 0941   ALT 20 11/11/2016 1426   ALT 13 08/30/2015 0941   ALKPHOS 104 10/18/2020 0454   ALKPHOS 65 11/11/2016 1426   ALKPHOS 62 08/30/2015 0941   BILITOT 0.8 10/18/2020 0454   BILITOT 0.6 10/11/2020 0941   BILITOT 1.25 (H) 08/30/2015 0941   GFRNONAA >60 10/18/2020 0454   GFRNONAA >60 10/11/2020 0941   GFRAA >60 03/10/2020 0851   Lipase  Component Value Date/Time   LIPASE 33 10/13/2020 1826       Studies/Results: DG Abd Portable 1V  Result Date: 10/18/2020 CLINICAL DATA:  Small bowel obstruction EXAM: PORTABLE ABDOMEN - 1 VIEW COMPARISON:  October 17, 2020 FINDINGS: Nasogastric tube tip and side port are in the stomach. There is dilated small bowel in the mid left abdomen. A small amount of contrast is seen in colon. No air-fluid levels. No free air evident. Patient is status post kyphoplasty at L1. Compression of the L2 vertebral body is stable. IMPRESSION: Nasogastric tube tip and side port in stomach. Loops of dilated bowel in the left mid abdomen. Question ileus versus a degree of obstruction. Free air evident. Electronically Signed   By: Lowella Grip III M.D.   On:  10/18/2020 07:59   DG Abd Portable 1V  Result Date: 10/16/2020 CLINICAL DATA:  Follow-up small bowel obstruction EXAM: PORTABLE ABDOMEN - 1 VIEW COMPARISON:  10/15/2020 FINDINGS: NG tube is advanced with the tip is in the stomach and side port at just below the junction. No dilated large or small bowel. Small amount of oral contrast in the ascending colon. Gas in the rectum. IMPRESSION: 1. NG tube in stomach. 2. No evidence of bowel obstruction Electronically Signed   By: Suzy Bouchard M.D.   On: 10/16/2020 09:58   DG Abd Portable 2V  Result Date: 10/17/2020 CLINICAL DATA:  Small bowel obstruction. EXAM: PORTABLE ABDOMEN - 2 VIEW COMPARISON:  October 16, 2020. FINDINGS: The bowel gas pattern is normal. Distal tip of nasogastric tube is seen in proximal stomach. Residual contrast is seen in the colon. There is no evidence of free air. No radio-opaque calculi or other significant radiographic abnormality is seen. IMPRESSION: No evidence of bowel obstruction or ileus. Electronically Signed   By: Marijo Conception M.D.   On: 10/17/2020 08:22    Anti-infectives: Anti-infectives (From admission, onward)   None       Assessment/Plan Stage IV pancreatic cancer Malignant SBO  -Patient cannot tolerate clamping trial.  Feeling full and bloated and back to suction.  Effluent moderate volume and bilious.  At this point, I do not think the obstruction will resolve itself.  Highly suspicious for a metastatic deposit.  While I do not see any evidence of large volume carcinomatosis on CAT scan, when there is one deposit there usually are many more and that cannot be seen on a CAT scan.    -I discussed options with the patient and her husband at bedside.  Most aggressive option would be diagnostic laparoscopy with possible bowel resection of focal metastasis and gastrostomy tube placement.  I did caution that the majority of the time, carcinomatosis is rather widespread and it is not safe to resect with risk  of leak being extremely high in the setting of carcinomatosis.  Another option is to pursue medical oncology's recommendations of palliative G-tube.  Regardless of the situation she needs a G-tube.  Patient and her husband wish to just do a G-tube at this point through interventional radiology to avoid an operation.  Patient miserable with NG tube in with sore throat and would like to try and get that out soon as safely possible.  Magic mouthwash of limited success and no other swallowing/topical agents helping much  -transition to hospice which is not an unreasonable thought.  Surgery will follow more peripherally.  Reconsult as needed.  FEN: ice chips and sips, TPN, NGT clamping trials VTE: lovenox ID: no current abx  LOS: 4 days    Adin Hector , [facility] 10/18/2020, 9:24 AM Please see Amion for pager number during day hours 7:00am-4:30pm

## 2020-10-18 NOTE — TOC Initial Note (Signed)
Transition of Care [facility]) - Initial/Assessment Note    Patient Details  Name: Tamara Powell MRN: [id number] Date of Birth: 09-Nov-1945  Transition of [facility]) CM/SW Contact:    Tamara Phi, RN Phone Number: 10/18/2020, 3:25 PM  Clinical Narrative:  Referral for home w/hospice choice-Informed patient/spouse on phone referral process-they wanted a face to face for referral info-TOC liaons Tamara Powell CSW provided face to face-choice was [place] notified-she will talk to patient/spouse-await outcome of eval.DNR, may need PTAR.                  Expected Discharge Plan: Home w Hospice Care Barriers to Discharge: Continued Medical Work up   Patient Goals and CMS Choice Patient states their goals for this hospitalization and ongoing recovery are:: go home CMS Medicare.gov Compare Post Acute Care list provided to:: Patient Choice offered to / list presented to : [name]  Expected Discharge Plan and Services Expected Discharge Plan: [place]   Discharge Planning Services: CM Consult Post Acute Care Choice: Hospice Living arrangements for the past 2 months: Single Family Home                                      Prior Living Arrangements/Services Living arrangements for the past 2 months: Single Family Home Lives with:: Spouse Patient language and need for interpreter reviewed:: Yes Do you feel safe going back to the place where you live?: Yes      Need for Family Participation in Patient Care: No (Comment) Care giver support system in place?: Yes (comment)   Criminal Activity/Legal Involvement Pertinent to Current Situation/Hospitalization: No - Comment as needed  Activities of Daily Living Home Assistive Devices/Equipment: Eyeglasses,Hearing aid ADL Screening (condition at time of admission) Patient's cognitive ability adequate to safely complete daily activities?: Yes Is the patient deaf or have difficulty hearing?:  Yes Does the patient have difficulty seeing, even when wearing glasses/contacts?: No Does the patient have difficulty concentrating, remembering, or making decisions?: No Patient able to express need for assistance with ADLs?: Yes Does the patient have difficulty dressing or bathing?: No Independently performs ADLs?: Yes (appropriate for developmental age) Does the patient have difficulty walking or climbing stairs?: No Weakness of Legs: None Weakness of Arms/Hands: None  Permission Sought/Granted Permission sought to share information with : Case Manager Permission granted to share information with : Yes, Verbal Permission Granted  Share Information with NAME: Case Manager     Permission granted to share info w Relationship: Tamara Powell spouse [phone]     Emotional Assessment Appearance:: Appears stated age Attitude/Demeanor/Rapport: Gracious Affect (typically observed): Accepting Orientation: : Oriented to Place,Oriented to  Time,Oriented to Self,Oriented to Situation Alcohol / Substance Use: Not Applicable Psych Involvement: No (comment)  Admission diagnosis:  Small bowel obstruction ([place]) [K56.609] SBO (small bowel obstruction) ([place]) [K56.609] Encounter for imaging study to confirm nasogastric (NG) tube placement [Z01.89] Patient Active Problem List   Diagnosis Date Noted  . 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 Small bowel obstruction ([place]) 10/13/2020  . Pancreatic cancer metastasized to liver ([place]) 07/25/2020  . Pancreatic cancer metastasized to lung ([place]) 07/25/2020  . Goals of care, counseling/discussion 07/19/2020  . Iron deficiency anemia 11/14/2016  . CLL (chronic lymphocytic leukemia) ([place]) 06/02/2012  . Shingles 06/02/2012   PCP:  Tamara Sheriff, MD Pharmacy:   [id number] - [place], [place] -  [address] [place] [facility] [place] [address] Phone: [phone] Fax: [phone]     Social Determinants of Health (SDOH)  Interventions    Readmission Risk Interventions No flowsheet data found.

## 2020-10-18 NOTE — Progress Notes (Signed)
During shift report patient stated she felt nauseous again. NGT hooked back to LIWS. Will continue to monitor closely.

## 2020-10-18 NOTE — Consult Note (Signed)
Chief Complaint: Patient was seen in consultation today for obstructive carcinomatosis peritonei  Referring Physician(s): Dr. Marin Olp  Supervising Physician: Corrie Mckusick  Patient Status: [facility] - In-pt  History of Present Illness: ALILAH MCMEANS is a 75 y.o. female with past medical history of metastatic pancreatic cancer admitted with bowel obstruction related to tumor burden.  Patient with intractable nausea/vomiting.  An NGT was placed for decompression and patient with significant relief in symptoms when in place and connected to suction.  A clamping trial was attempted today and she had nearly immediate return of her nausea.  Patient seen by Oncology who is recommending home with hospice.  After further discussion, patient and family requesting home hospice with venting gastrostomy tube.  IR consulted for placement.   CT Abdomen Pelvis 10/13/20 reviewed by Dr. Earleen Newport who approves patient for procedure.    Past Medical History:  Diagnosis Date  . Cancer with unknown primary site ([place]) 07/19/2020  . Goals of care, counseling/discussion 07/19/2020  . Metastasis to liver with unknown primary site [facility]) 07/19/2020  . Pancreatic cancer metastasized to liver ([place]) 07/25/2020  . Pancreatic cancer metastasized to lung ([place]) 07/25/2020  . Skin cancer    melanoma found on abdomen and removed by Dr. Pablo Ledger; followed by dermatology every 6 months    Past Surgical History:  Procedure Laterality Date  . IR IMAGING GUIDED PORT INSERTION  08/07/2020    Allergies: Alendronate, Ibandronic acid, and Latex  Medications: Prior to Admission medications   Medication Sig Start Date End Date Taking? Authorizing Provider  ondansetron (ZOFRAN-ODT) 4 MG disintegrating tablet Take 4 mg by mouth every 6 (six) hours as needed. 09/29/20  Yes [provider]  oxyCODONE (OXY IR/ROXICODONE) 5 MG immediate release tablet Take 1 tablet (5 mg total) by mouth every 4 (four) hours as needed for  severe pain. 10/06/20  Yes [place], Holli Humbles, NP  COLLAGEN PO Take by mouth. One scoop of powder daily in coffee.    [provider]  dicyclomine (BENTYL) 10 MG capsule Take 10 mg by mouth every 6 (six) hours as needed for spasms. 08/23/20   [provider]  fish oil-omega-3 fatty acids 1000 MG capsule Take 1 g by mouth 2 (two) times daily.    [provider]  Nyoka Cowden Tea, Camellia sinensis, (GREEN TEA EXTRACT PO) Take by mouth. Takes 3 caps daily.    [provider]  magic mouthwash w/lidocaine SOLN Take 5 mLs by mouth 3 (three) times daily as needed for mouth pain. 09/15/20   [place], Holli Humbles, NP  OVER THE COUNTER MEDICATION Take by mouth 2 (two) times daily. WELLNESS FORMULA CAPSULE--HAIR SKIN NAILS CAPSULES.    [provider]  OVER THE COUNTER MEDICATION Green Vibrance daily.    [provider]  oxyCODONE-acetaminophen (PERCOCET/ROXICET) 5-325 MG tablet Take by mouth every 6 (six) hours as needed for severe pain. Patient not taking: Reported on 10/14/2020    [provider]  Turmeric 450 MG CAPS Take by mouth every morning.    [provider]  vitamin E 180 MG (400 UNITS) capsule 400 Units daily. 07/09/20   [provider]  prochlorperazine (COMPAZINE) 10 MG tablet Take 1 tablet (10 mg total) by mouth every 6 (six) hours as needed (Nausea or vomiting). 08/02/20 10/11/20  [name], MD     History reviewed. No pertinent family history.  Social History   Socioeconomic History  . Marital status: Married    Spouse name: Not on file  .  Number of children: Not on file  . Years of education: Not on file  . Highest education level: Not on file  Occupational History  . Not on file  Tobacco Use  . Smoking status: Never Smoker  . Smokeless tobacco: Never Used  . Tobacco comment: never used tobacco  Vaping Use  . Vaping Use: Never used  Substance and Sexual Activity  . Alcohol use: Not Currently     Alcohol/week: 0.0 standard drinks  . Drug use: Not Currently  . Sexual activity: Not Currently  Other Topics Concern  . Not on file  Social History Narrative  . Not on file   Social Determinants of Health   Financial Resource Strain: Not on file  Food Insecurity: Not on file  Transportation Needs: Not on file  Physical Activity: Not on file  Stress: Not on file  Social Connections: Not on file     Review of Systems: A 12 point ROS discussed and pertinent positives are indicated in the HPI above.  All other systems are negative.  Review of Systems  Constitutional: Negative for fatigue and fever.  Respiratory: Negative for cough and shortness of breath.   Cardiovascular: Negative for chest pain.  Gastrointestinal: Positive for abdominal distention, abdominal pain, nausea and vomiting.  Musculoskeletal: Negative for back pain.  Psychiatric/Behavioral: Negative for behavioral problems and confusion.    Vital Signs: BP 130/70 (BP Location: Left Arm)   Pulse (!) 104   Temp 97.9 F (36.6 C) (Oral)   Resp 18   Ht 5\' 8"  (1.727 m)   Wt 133 lb 9.6 oz (60.6 kg)   SpO2 98%   BMI 20.31 kg/m   Physical Exam Vitals and nursing note reviewed.  Constitutional:      General: She is not in acute distress.    Appearance: Normal appearance. She is ill-appearing.  HENT:     Mouth/Throat:     Mouth: Mucous membranes are moist.     Pharynx: Oropharynx is clear.  Cardiovascular:     Rate and Rhythm: Normal rate and regular rhythm.  Pulmonary:     Effort: Pulmonary effort is normal. No respiratory distress.     Breath sounds: Normal breath sounds.  Abdominal:     General: Abdomen is flat.     Palpations: Abdomen is soft.     Comments: NGT in place with bilious output  Skin:    General: Skin is warm and dry.  Neurological:     General: No focal deficit present.     Mental Status: She is alert and oriented to person, place, and time. Mental status is at baseline.  Psychiatric:         Mood and Affect: Mood normal.        Behavior: Behavior normal.        Thought Content: Thought content normal.        Judgment: Judgment normal.      MD Evaluation Airway: WNL Heart: WNL Abdomen: WNL Chest/ Lungs: WNL ASA  Classification: 3 Mallampati/Airway Score: One   Imaging: DG Abd 1 View  Result Date: 10/13/2020 CLINICAL DATA:  NG tube placement EXAM: ABDOMEN - 1 VIEW COMPARISON:  CT from same day FINDINGS: The tip of the NG tube projects over the gastric body. The side port is near the GE junction.the patient is status post prior vertebral augmentation of the L1 vertebral body. The bowel gas pattern is nonspecific. IMPRESSION: The tip of the NG tube projects over the gastric body. Recommend advancing  the tube further into the stomach by approximately 3-5 cm. Electronically Signed   By: Constance Holster M.D.   On: 10/13/2020 21:56   CT Chest W Contrast  Result Date: 10/09/2020 CLINICAL DATA:  Metastatic pancreatic cancer. EXAM: CT CHEST WITH CONTRAST TECHNIQUE: Multidetector CT imaging of the chest was performed during intravenous contrast administration. CONTRAST:  1mL OMNIPAQUE IOHEXOL 300 MG/ML  SOLN COMPARISON:  CT abdomen/pelvis 09/25/2020 and PET-CT 08/01/2020 and chest CT 07/20/2020 FINDINGS: Cardiovascular: The heart is normal in size. No pericardial effusion. The aorta is normal in caliber. No dissection. The branch vessels are patent. Mediastinum/Nodes: Small scattered mediastinal hilar lymph nodes but no mass or overt lymphadenopathy. The esophagus is grossly normal. The thyroid gland is unremarkable. Lungs/Pleura: A few small scattered subpleural pulmonary nodules are stable. No new or progressive findings are identified. Stable basilar scarring changes. No pleural effusions or pleural nodules. Upper Abdomen: Stable diffuse hepatic metastatic disease. Musculoskeletal: No significant bony findings. No findings suspicious for osseous metastatic disease. Scattered  hemangiomas are noted. Stable vertebral augmentation changes and L1. IMPRESSION: 1. Stable small scattered subpleural pulmonary nodules. No new or progressive findings. 2. Stable diffuse hepatic metastatic disease. 3. No mediastinal hilar mass or adenopathy. Aortic Atherosclerosis (ICD10-I70.0). Electronically Signed   By: Marijo Sanes M.D.   On: 10/09/2020 14:48   CT ABDOMEN PELVIS W CONTRAST  Result Date: 10/13/2020 CLINICAL DATA:  Lower abdominal pain with nausea and vomiting. EXAM: CT ABDOMEN AND PELVIS WITH CONTRAST TECHNIQUE: Multidetector CT imaging of the abdomen and pelvis was performed using the standard protocol following bolus administration of intravenous contrast. CONTRAST:  161mL OMNIPAQUE IOHEXOL 300 MG/ML  SOLN COMPARISON:  September 25, 2020 FINDINGS: Lower chest: Mild linear scarring and/or atelectasis is seen within the bilateral lung bases. Hepatobiliary: Numerous stable heterogeneous low-attenuation liver lesions are seen. Status post cholecystectomy. No biliary dilatation. Pancreas: Unremarkable. No pancreatic ductal dilatation or surrounding inflammatory changes. Spleen: Normal in size without focal abnormality. Adrenals/Urinary Tract: Adrenal glands are unremarkable. Kidneys are normal, without renal calculi, focal lesion, or hydronephrosis. The urinary bladder is contracted and subsequently limited in evaluation. Stomach/Bowel: Stomach is within normal limits. Appendix appears normal. A segment of markedly dilated distal duodenum is seen within the left upper quadrant (maximum small bowel diameter of approximately 5.2 cm). An abrupt transition zone is noted within the medial aspect of the mid to upper left abdomen (axial CT images 33 through 40, CT series number 2). Noninflamed diverticula are seen throughout the sigmoid colon. Vascular/Lymphatic: No significant vascular findings are present. No enlarged abdominal or pelvic lymph nodes. Reproductive: Status post hysterectomy. No adnexal  masses. Other: No abdominal wall hernia or abnormality. A mild amount of abdominal and pelvic free fluid is noted. Musculoskeletal: Chronic compression fracture deformities are seen at the levels of L1 and L2. IMPRESSION: 1. High-grade proximal small bowel obstruction. 2. Findings consistent with the patient's known liver metastasis. 3. Sigmoid diverticulosis. 4. Mild amount of abdominal and pelvic free fluid. 5. 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 Electronically Signed   By: Virgina Norfolk M.D.   On: 10/13/2020 19:49   DG Abd Portable 1V  Result Date: 10/18/2020 CLINICAL DATA:  Small bowel obstruction EXAM: PORTABLE ABDOMEN - 1 VIEW COMPARISON:  October 17, 2020 FINDINGS: Nasogastric tube tip and side port are in the stomach. There is dilated small bowel in the mid left abdomen. A small amount of contrast is seen in colon. No air-fluid levels. No free air evident. Patient  is status post kyphoplasty at L1. Compression of the L2 vertebral body is stable. IMPRESSION: Nasogastric tube tip and side port in stomach. Loops of dilated bowel in the left mid abdomen. Question ileus versus a degree of obstruction. Free air evident. Electronically Signed   By: Lowella Grip III M.D.   On: 10/18/2020 07:59   DG Abd Portable 1V  Result Date: 10/16/2020 CLINICAL DATA:  Follow-up small bowel obstruction EXAM: PORTABLE ABDOMEN - 1 VIEW COMPARISON:  10/15/2020 FINDINGS: NG tube is advanced with the tip is in the stomach and side port at just below the junction. No dilated large or small bowel. Small amount of oral contrast in the ascending colon. Gas in the rectum. IMPRESSION: 1. NG tube in stomach. 2. No evidence of bowel obstruction Electronically Signed   By: Suzy Bouchard M.D.   On: 10/16/2020 09:58   DG Abd Portable 1V-Small Bowel Obstruction Protocol-initial, 8 hr delay  Result Date: 10/15/2020 CLINICAL DATA:  Bowel obstruction EXAM: PORTABLE ABDOMEN - 1 VIEW COMPARISON:   10/13/2020 radiograph and CT FINDINGS: Esophageal tube has been retracted, side-port projects over distal esophagus. Dilute contrast within dilated small bowel up to 5.7 cm. Slightly more dense appearing contrast within the right mid abdomen potentially within colon. IMPRESSION: 1. Esophageal tube side-port over the distal esophagus, recommend advancement by approximately 10 cm for more optimal positioning. 2. Dilute contrast within dilated small bowel within the central and left abdomen concerning for bowel obstruction. Small amount of more dense appearing radiopaque material or contrast in the right colon. These results will be called to the ordering clinician or representative by the Radiologist Assistant, and communication documented in the PACS or [employer]. Electronically Signed   By: Donavan Foil M.D.   On: 10/15/2020 21:58   DG Abd Portable 2V  Result Date: 10/17/2020 CLINICAL DATA:  Small bowel obstruction. EXAM: PORTABLE ABDOMEN - 2 VIEW COMPARISON:  October 16, 2020. FINDINGS: The bowel gas pattern is normal. Distal tip of nasogastric tube is seen in proximal stomach. Residual contrast is seen in the colon. There is no evidence of free air. No radio-opaque calculi or other significant radiographic abnormality is seen. IMPRESSION: No evidence of bowel obstruction or ileus. Electronically Signed   By: Marijo Conception M.D.   On: 10/17/2020 08:22    Labs:  CBC: Recent Labs    10/14/20 0944 10/15/20 0526 10/16/20 0444 10/18/20 0454  WBC 12.3* 13.6* 13.7* 16.4*  HGB 10.5* 11.0* 11.1* 11.1*  HCT 32.6* 34.4* 35.7* 35.3*  PLT 299 345 354 361    COAGS: No results for input(s): INR, APTT in the last 8760 hours.  BMP: Recent Labs    03/10/20 0851 07/19/20 1446 10/15/20 0526 10/16/20 0444 10/17/20 0453 10/18/20 0454  NA 140   < > 138 140 140 139  K 4.6   < > 3.8 4.4 4.7 4.0  CL 104   < > 102 104 107 107  CO2 30   < > 26 24 23  21*  GLUCOSE 102*   < > 121* 142* 138* 136*  BUN  11   < > 16 29* 38* 37*  CALCIUM 9.3   < > 8.7* 9.2 8.9 8.9  CREATININE 0.90   < > 0.56 0.58 0.66 0.56  GFRNONAA >60   < > >60 >60 >60 >60  GFRAA >60  --   --   --   --   --    < > = values in this interval not  displayed.    LIVER FUNCTION TESTS: Recent Labs    10/14/20 0944 10/15/20 0526 10/16/20 0444 10/18/20 0454  BILITOT 0.8 0.7 0.5 0.8  AST 15 16 16 30   ALT 14 13 13 27   ALKPHOS 84 86 80 104  PROT 5.7* 5.8* 6.0* 6.0*  ALBUMIN 2.8* 3.0* 2.9* 2.9*    TUMOR MARKERS: No results for input(s): AFPTM, CEA, CA199, CHROMGRNA in the last 8760 hours.  Assessment and Plan: Bowel obstruction in the setting of metastatic pancreatic cancer with carcinomatosis peritonei. PA to bedside alongside Dr. Earleen Newport to discuss gastrostomy tube.  Patient present along with husband.  Dr. Earleen Newport described the process, logistics, benefits, and risks of gastrostomy tube placement and answered all questions.  Patient and husband understand that the venting gastrostomy tube will require some care at home.  That the tube will allow for venting of gas and thin liquids. They understand she has ascites and the tube may leak.  They understand that the tube will provide greater comfort than the NGT and allow her to maximize her time at home vs. In the hospital with recurrent symptoms or NGT.  After discussion, they elect to proceed with gastrostomy tube placement.  Patient currently has an NGT in place.  She is not on blood thinners.  Tentatively plan for tube placement in IR tomorrow as patient is to discharge home, however they are aware that procedure will be done as schedule allows.  All questions answered.   Risks and benefits image guided gastrostomy tube placement was discussed with the patient including, but not limited to the need for a barium enema during the procedure, bleeding, infection, peritonitis and/or damage to adjacent structures.  All of the patient's questions were answered, patient is  agreeable to proceed.  Consent signed and in chart.  Thank you for this interesting consult.  I greatly enjoyed meeting AVINA EBERLE and look forward to participating in their care.  A copy of this report was sent to the requesting provider on this date.  Electronically Signed: Docia Barrier, PA 10/18/2020, 5:14 PM   I spent a total of 40 Minutes    in face to face in clinical consultation, greater than 50% of which was counseling/coordinating care for carcinomatosis peritonei

## 2020-10-18 NOTE — Progress Notes (Signed)
Manufacturing engineer [facility]) Hospital Liaison: RN note    Notified by Transition of Care Manger, Dessa Phi, RN of patient/family request for [facility] services at home after discharge. Chart and patient information under review by [facility] physician. Hospice eligibility pending currently.    Writer spoke with patient and husband to initiate education related to hospice philosophy, services and team approach to care. Both  verbalized understanding of information given.   Please send signed and completed DNR form home with patient/family. Patient will need prescriptions for discharge comfort medications.    DME needs have been discussed, patient currently has the following equipment in the home: 3N1.  Patient/family requests the following DME for delivery to the home: walker and shower bench. [place] equipment manager has been notified and will contact DME provider to arrange delivery to the home. Home address has been verified and is correct in the chart.                 Jenny Reichmann is the family member to contact to arrange time of delivery.     [facility] aware of the above. Please notify [facility] when patient is ready to leave the unit at discharge. (Call [phone] or [phone] after 5pm.) [facility] information and contact numbers given to patient and husband.      A Please do not hesitate to call with questions.    Thank you,   Farrel Gordon, RN, [place] (listed on [facility] under [place])    [phone]

## 2020-10-18 NOTE — Progress Notes (Signed)
Reviewed hospitalization. Patient was scheduled to start a new regimen today, however with her change in status, now patient plan is to be discharged with hospice.   Oncology Nurse Navigator Documentation  Oncology Nurse Navigator Flowsheets 10/18/2020  Confirmed Diagnosis Date -  Diagnosis Status -  Phase of Treatment -  Chemotherapy Pending- Reason: -  Chemotherapy Actual Start Date: -  Navigator Follow Up Date: -  Navigator Follow Up Reason: -  Navigation Complete Date: 10/18/2020  Post Navigation: Continue to Follow Patient? No  Reason Not Navigating Patient: Airline pilot Encounter Type Appt/Treatment Plan Review  Telephone -  Treatment Initiated Date -  Patient Visit Type MedOnc  Treatment Phase Other  Barriers/Navigation Needs -  Education -  Interventions None Required  Acuity -  Coordination of Care -  Education Method -  Support Groups/Services Friends and Family  Time Spent with Patient 15

## 2020-10-18 NOTE — Progress Notes (Signed)
PHARMACY - TOTAL PARENTERAL NUTRITION CONSULT NOTE   Indication: Small bowel obstruction  Patient Measurements: Height: 5\' 8"  (172.7 cm) Weight: 60.6 kg (133 lb 9.6 oz) IBW/kg (Calculated) : 63.9 TPN AdjBW (KG): 61.7 Body mass index is 20.31 kg/m.  Recent Labs    10/16/20 0444 10/17/20 0453 10/18/20 0454  NA 140 140 139  K 4.4 4.7 4.0  CL 104 107 107  CO2 24 23 21*  GLUCOSE 142* 138* 136*  BUN 29* 38* 37*  CREATININE 0.58 0.66 0.56  CALCIUM 9.2 8.9 8.9  PHOS 3.9 3.7 3.6  MG 2.1 2.2 1.9  ALBUMIN 2.9*  --  2.9*  ALKPHOS 80  --  104  AST 16  --  30  ALT 13  --  27  BILITOT 0.5  --  0.8  TRIG 108  --   --   PREALBUMIN 12.7*  --  13.4*   Assessment:  75 y/o F with metastatic pancreatic cancer and recent colitis presented with vomiting and was found to have SBO. Orders received to begin TPN with pharmacy assistance.  Glucose / Insulin: no hx of DM. CBGs < 150. SSI dc'd 3/14 Electrolytes:  WNL Renal:  SCr WNL,  BUN 37 Hepatic: LFTs WNL. Trig wnl. Prealbumin 11.7/12.2/12.7/13.4 Intake / Output; MIVF: NG 1650 mL yesterday. Stool x 1 3/15, I/O + 434,6 GI Imaging: 3/11: high-grade proximal SBO 3/16 LDJ:TTSVX of dilated bowel in the left mid abdomen. Question ileus versus a degree of obstruction. Free air evident. GI Surgeries / Procedures:  Central access: [facility] 08/07/20 TPN start date: 10/14/2020  Nutritional Goals  Kcal: 1800-2000 Protein 90-105 g Fluid > 1.8 L  TPN - 23ml/hr with 105.6g of protein, 17% dextrose ,20 g of lipids. This will give 1916 kcal  Current Nutrition:  NPO, TPN  Plan:  Continue TPN @ goal rate of 80 mL/hr to provide 105.6 g protein & 1916 Kcal for full support Lipids daily Electrolytes in TPN: Na 74mEq/L, K 70mEq/L, Ca 42mEq/L, Mg 6mEq/L, add back 5 mEq/L Phos today. Cl:Ac 1:1 Add standard MVI daily and trace elements to TPN SSI/CBGs dc'd Monitor TPN labs, BMET, Mg & Phos as needed   Eudelia Bunch, Pharm.D 10/18/2020 8:18 AM Phone  number per [email or web address]  10/18/2020 8:16 AM

## 2020-10-18 NOTE — Progress Notes (Addendum)
PROGRESS NOTE    Tamara Powell  VOJ:[id number] DOB: 03/07/1946 DOA: 10/13/2020 PCP: Angelina Sheriff, MD   Brief Narrative:  75 year old female with past medical history of pancreatic cancer with mets to liver (follows with Dr. Marin Olp) presenting to [place] emergency department with 2-day history of abdominal pain nausea and vomiting. Of note, patient presented to [facility] 1 week ago with similar symptoms, was admitted for 3 days, diagnosed with colitis and discharged with antibiotics. Patient now presents with recurrent abdominal pain nausea and vomiting. CT imaging of the abdomen reveals high-grade small bowel obstruction. Case discussed with Dr. Brantley Stage with general surgery who recommends hospitalization - general surgery and oncology to follow.Outpatient plan to start new chemotherapy in the next week. 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 General surgery and medical oncology has been following.  NG tube was placed and was clamped yesterday however this morning patient felt really nauseated and so the NG tube had to be reconnected to intermittent wall suctioning.  Feels better after this was done. Seen by medical oncology this morning and discussions were held with patient.  A palliative G-tube option was presented which she is agreeable to.  Plan is to transition her to home with hospice once that is done. -Continue TPN per Dr. Marin Olp, IV fluids ongoing as well in setting of n.p.o. status  Pancreatic cancer/Questionable carcinomatosis/ascites Concern for carcinomatosis as well as metastatic disease given above.  See discussion above.  Plan is for transition to hospice once G-tube has been placed. Leukocytosis likely due to malignancy.  Normocytic anemia Likely due to malignancy.  No evidence of overt bleeding.  Hemoglobin is stable.  Goals of care Palliative care  was consulted.  Medical oncology also having discussions with patient and family.  After further discussions today plan is for transition to hospice at home.   DVT prophylaxis: Lovenox Code Status: Full Family Communication: Husband and friends at bedside Disposition: Home with hospice  Status is: Inpatient  Dispo: The patient is from: Home              Anticipated d/c is to: Home              Anticipated d/c date is:48-72 hours pending clinical course              Patient currently not medically stable for discharge  Consultants:   General surgery, oncology  Procedures:   None  Antimicrobials:  None indicated  Subjective: Patient denies any complaints currently.  She was nauseated earlier today and that is improved once the NG tube was placed back to suctioning.  Denies any abdominal pain currently.    Objective: Vitals:   10/17/20 1349 10/17/20 2150 10/17/20 2359 10/18/20 0451  BP: 123/71 118/65 (!) 119/57 123/62  Pulse: 99 (!) 104 98 99  Resp: (!) 22 16 16 20   Temp: (!) 97.5 F (36.4 C) 98.6 F (37 C) 97.8 F (36.6 C) 97.9 F (36.6 C)  TempSrc: Oral Oral Oral Oral  SpO2: 98% 98% 97% 97%  Weight:      Height:        Intake/Output Summary (Last 24 hours) at 10/18/2020 0942 Last data filed at 10/18/2020 0340 Gross per 24 hour  Intake 2084.58 ml  Output 1150 ml  Net 934.58 ml   Filed Weights   10/13/20 1726 10/14/20 0050  Weight: 61.7 kg 60.6  kg    Examination:  General appearance: Awake alert.  In no distress NG tube noted in the nostril Resp: Clear to auscultation bilaterally.  Normal effort Cardio: S1-S2 is normal regular.  No S3-S4.  No rubs murmurs or bruit GI: Abdomen is soft.  Nontender nondistended.  No bowel sounds heard. Extremities: No edema.  Full range of motion of lower extremities. Neurologic: Alert and oriented x3.  No focal neurological deficits.    Data Reviewed: I have personally reviewed following labs and imaging  studies  CBC: Recent Labs  Lab 10/11/20 0941 10/13/20 1826 10/14/20 0944 10/15/20 0526 10/16/20 0444 10/18/20 0454  WBC 15.8* 16.8* 12.3* 13.6* 13.7* 16.4*  NEUTROABS 3.6 4.0  --  3.6 6.2 6.1  HGB 11.5* 12.1 10.5* 11.0* 11.1* 11.1*  HCT 34.8* 36.5 32.6* 34.4* 35.7* 35.3*  MCV 92.6 92.9 95.6 95.8 97.3 98.9  PLT 319 385 299 345 354 229   Basic Metabolic Panel: Recent Labs  Lab 10/14/20 0944 10/15/20 0526 10/16/20 0444 10/17/20 0453 10/18/20 0454  NA 136 138 140 140 139  K 3.8 3.8 4.4 4.7 4.0  CL 101 102 104 107 107  CO2 26 26 24 23  21*  GLUCOSE 102* 121* 142* 138* 136*  BUN 11 16 29* 38* 37*  CREATININE 0.49 0.56 0.58 0.66 0.56  CALCIUM 8.7* 8.7* 9.2 8.9 8.9  MG  --  2.1 2.1 2.2 1.9  PHOS  --  5.1* 3.9 3.7 3.6   GFR: Estimated Creatinine Clearance: 58.1 mL/min (by C-G formula based on SCr of 0.56 mg/dL). Liver Function Tests: Recent Labs  Lab 10/13/20 1826 10/14/20 0944 10/15/20 0526 10/16/20 0444 10/18/20 0454  AST 18 15 16 16 30   ALT 14 14 13 13 27   ALKPHOS 98 84 86 80 104  BILITOT 0.6 0.8 0.7 0.5 0.8  PROT 6.3* 5.7* 5.8* 6.0* 6.0*  ALBUMIN 3.1* 2.8* 3.0* 2.9* 2.9*   Recent Labs  Lab 10/13/20 1826  LIPASE 33   CBG: Recent Labs  Lab 10/16/20 1657 10/17/20 1148 10/17/20 1637 10/17/20 2357 10/18/20 0546  GLUCAP 144* 129* 116* 125* 131*   Lipid Profile: Recent Labs    10/16/20 0444  TRIG 108   Sepsis Labs: Recent Labs  Lab 10/13/20 2125  LATICACIDVEN 0.5    Recent Results (from the past 240 hour(s))  Resp Panel by RT-PCR (Flu A&B, Covid) Nasopharyngeal Swab     Status: None   Collection Time: 10/13/20 10:02 PM   Specimen: Nasopharyngeal Swab; Nasopharyngeal(NP) swabs in vial transport medium  Result Value Ref Range Status   SARS Coronavirus 2 by RT PCR NEGATIVE NEGATIVE Final    Comment: (NOTE) SARS-CoV-2 target nucleic acids are NOT DETECTED.  The SARS-CoV-2 RNA is generally detectable in upper respiratory specimens during the  acute phase of infection. The lowest concentration of SARS-CoV-2 viral copies this assay can detect is 138 copies/mL. A negative result does not preclude SARS-Cov-2 infection and should not be used as the sole basis for treatment or other patient management decisions. A negative result may occur with  improper specimen collection/handling, submission of specimen other than nasopharyngeal swab, presence of viral mutation(s) within the areas targeted by this assay, and inadequate number of viral copies(<138 copies/mL). A negative result must be combined with clinical observations, patient history, and epidemiological information. The expected result is Negative.  Fact Sheet for Patients:  EntrepreneurPulse.com.au  Fact Sheet for Healthcare Providers:  IncredibleEmployment.be  This test is no t yet approved or cleared by the [place]  States FDA and  has been authorized for detection and/or diagnosis of SARS-CoV-2 by FDA under an Emergency Use Authorization (EUA). This EUA will remain  in effect (meaning this test can be used) for the duration of the COVID-19 declaration under Section 564(b)(1) of the Act, 21 U.S.C.section 360bbb-3(b)(1), unless the authorization is terminated  or revoked sooner.       Influenza A by PCR NEGATIVE NEGATIVE Final   Influenza B by PCR NEGATIVE NEGATIVE Final    Comment: (NOTE) The Xpert Xpress SARS-CoV-2/FLU/RSV plus assay is intended as an aid in the diagnosis of influenza from Nasopharyngeal swab specimens and should not be used as a sole basis for treatment. Nasal washings and aspirates are unacceptable for Xpert Xpress SARS-CoV-2/FLU/RSV testing.  Fact Sheet for Patients: EntrepreneurPulse.com.au  Fact Sheet for Healthcare Providers: IncredibleEmployment.be  This test is not yet approved or cleared by the [place] FDA and has been authorized for detection and/or  diagnosis of SARS-CoV-2 by FDA under an Emergency Use Authorization (EUA). This EUA will remain in effect (meaning this test can be used) for the duration of the COVID-19 declaration under Section 564(b)(1) of the Act, 21 U.S.C. section 360bbb-3(b)(1), unless the authorization is terminated or revoked.  Performed at [facility], [address], [place], [place] [address]          Radiology Studies: DG Abd Portable 1V  Result Date: 10/18/2020 CLINICAL DATA:  Small bowel obstruction EXAM: PORTABLE ABDOMEN - 1 VIEW COMPARISON:  October 17, 2020 FINDINGS: Nasogastric tube tip and side port are in the stomach. There is dilated small bowel in the mid left abdomen. A small amount of contrast is seen in colon. No air-fluid levels. No free air evident. Patient is status post kyphoplasty at L1. Compression of the L2 vertebral body is stable. IMPRESSION: Nasogastric tube tip and side port in stomach. Loops of dilated bowel in the left mid abdomen. Question ileus versus a degree of obstruction. Free air evident. Electronically Signed   By: Lowella Grip III M.D.   On: 10/18/2020 07:59   DG Abd Portable 2V  Result Date: 10/17/2020 CLINICAL DATA:  Small bowel obstruction. EXAM: PORTABLE ABDOMEN - 2 VIEW COMPARISON:  October 16, 2020. FINDINGS: The bowel gas pattern is normal. Distal tip of nasogastric tube is seen in proximal stomach. Residual contrast is seen in the colon. There is no evidence of free air. No radio-opaque calculi or other significant radiographic abnormality is seen. IMPRESSION: No evidence of bowel obstruction or ileus. Electronically Signed   By: Marijo Conception M.D.   On: 10/17/2020 08:22        Scheduled Meds: . bisacodyl  10 mg Rectal Daily  . chlorhexidine  15 mL Mouth Rinse BID  . Chlorhexidine Gluconate Cloth  6 each Topical Daily  . enoxaparin (LOVENOX) injection  40 mg Subcutaneous Q24H  . magic mouthwash  15 mL Oral QID  . mouth rinse  15 mL Mouth Rinse  q12n4p   Continuous Infusions: . TPN ADULT (ION) 80 mL/hr at 10/17/20 1749  . TPN ADULT (ION)       LOS: 4 days    [place] [facility]  If 7PM-7AM, please contact night-coverage [email or web address]  10/18/2020, 9:42 AM

## 2020-10-18 NOTE — Progress Notes (Signed)
NG tube clamped @ 0200, patient has tolerated well, no nausea or vomiting reported. I will continue to monitor patient and will update oncoming shift.  Wynona Neat, RN

## 2020-10-18 NOTE — Progress Notes (Signed)
Progress Note     Subjective: Patient reports she had a small BM after suppository but was still getting nauseated with NGT clamped. NGT was reconnected early this AM and has had already ~400 cc back out.   Objective: Vital signs in last 24 hours: Temp:  [97.5 F (36.4 C)-98.6 F (37 C)] 97.9 F (36.6 C) (03/16 0451) Pulse Rate:  [98-104] 99 (03/16 0451) Resp:  [16-22] 20 (03/16 0451) BP: (118-123)/(57-71) 123/62 (03/16 0451) SpO2:  [97 %-98 %] 97 % (03/16 0451) Last BM Date: 10/17/20  Intake/Output from previous day: 03/15 0701 - 03/16 0700 In: 2084.6 [P.O.:240; I.V.:1844.6] Out: 1150 [Emesis/NG output:1150] Intake/Output this shift: No intake/output data recorded.  PE: General: pleasant, WD,thinfemale who is sittingin bed in NAD Heart: regular, rate, and rhythm.  Lungs: CTAB, no wheezes, rhonchi, or rales noted. Respiratory effort nonlabored Abd: soft, NT,mildly distended,BS hypoactive,NGT in place with bilious appearing drainage MS: all 4 extremities are symmetrical with no cyanosis, clubbing, or edema. Skin: warm and dry with no masses, lesions, or rashes Neuro: Cranial nerves 2-12 grossly intact, sensation is normal throughout Psych: A&Ox3 with an appropriate affect.    Lab Results:  Recent Labs    10/16/20 0444 10/18/20 0454  WBC 13.7* 16.4*  HGB 11.1* 11.1*  HCT 35.7* 35.3*  PLT 354 361   BMET Recent Labs    10/17/20 0453 10/18/20 0454  NA 140 139  K 4.7 4.0  CL 107 107  CO2 23 21*  GLUCOSE 138* 136*  BUN 38* 37*  CREATININE 0.66 0.56  CALCIUM 8.9 8.9   PT/INR No results for input(s): LABPROT, INR in the last 72 hours. CMP     Component Value Date/Time   NA 139 10/18/2020 0454   NA 139 11/11/2016 1426   NA 142 08/30/2015 0941   K 4.0 10/18/2020 0454   K 4.1 11/11/2016 1426   K 4.9 08/30/2015 0941   CL 107 10/18/2020 0454   CL 103 11/11/2016 1426   CO2 21 (L) 10/18/2020 0454   CO2 28 11/11/2016 1426   CO2 27 08/30/2015 0941    GLUCOSE 136 (H) 10/18/2020 0454   GLUCOSE 95 11/11/2016 1426   BUN 37 (H) 10/18/2020 0454   BUN 12 11/11/2016 1426   BUN 11.8 08/30/2015 0941   CREATININE 0.56 10/18/2020 0454   CREATININE 0.69 10/11/2020 0941   CREATININE 0.9 11/11/2016 1426   CREATININE 1.0 08/30/2015 0941   CALCIUM 8.9 10/18/2020 0454   CALCIUM 9.4 11/11/2016 1426   CALCIUM 9.5 08/30/2015 0941   PROT 6.0 (L) 10/18/2020 0454   PROT 6.6 11/11/2016 1426   PROT 6.8 08/30/2015 0941   ALBUMIN 2.9 (L) 10/18/2020 0454   ALBUMIN 3.7 11/11/2016 1426   ALBUMIN 3.9 08/30/2015 0941   AST 30 10/18/2020 0454   AST 16 10/11/2020 0941   AST 19 08/30/2015 0941   ALT 27 10/18/2020 0454   ALT 12 10/11/2020 0941   ALT 20 11/11/2016 1426   ALT 13 08/30/2015 0941   ALKPHOS 104 10/18/2020 0454   ALKPHOS 65 11/11/2016 1426   ALKPHOS 62 08/30/2015 0941   BILITOT 0.8 10/18/2020 0454   BILITOT 0.6 10/11/2020 0941   BILITOT 1.25 (H) 08/30/2015 0941   GFRNONAA >60 10/18/2020 0454   GFRNONAA >60 10/11/2020 0941   GFRAA >60 03/10/2020 0851   Lipase     Component Value Date/Time   LIPASE 33 10/13/2020 1826       Studies/Results: DG Abd Portable 1V  Result Date:  10/18/2020 CLINICAL DATA:  Small bowel obstruction EXAM: PORTABLE ABDOMEN - 1 VIEW COMPARISON:  October 17, 2020 FINDINGS: Nasogastric tube tip and side port are in the stomach. There is dilated small bowel in the mid left abdomen. A small amount of contrast is seen in colon. No air-fluid levels. No free air evident. Patient is status post kyphoplasty at L1. Compression of the L2 vertebral body is stable. IMPRESSION: Nasogastric tube tip and side port in stomach. Loops of dilated bowel in the left mid abdomen. Question ileus versus a degree of obstruction. Free air evident. Electronically Signed   By: Lowella Grip III M.D.   On: 10/18/2020 07:59   DG Abd Portable 1V  Result Date: 10/16/2020 CLINICAL DATA:  Follow-up small bowel obstruction EXAM: PORTABLE ABDOMEN - 1  VIEW COMPARISON:  10/15/2020 FINDINGS: NG tube is advanced with the tip is in the stomach and side port at just below the junction. No dilated large or small bowel. Small amount of oral contrast in the ascending colon. Gas in the rectum. IMPRESSION: 1. NG tube in stomach. 2. No evidence of bowel obstruction Electronically Signed   By: Suzy Bouchard M.D.   On: 10/16/2020 09:58   DG Abd Portable 2V  Result Date: 10/17/2020 CLINICAL DATA:  Small bowel obstruction. EXAM: PORTABLE ABDOMEN - 2 VIEW COMPARISON:  October 16, 2020. FINDINGS: The bowel gas pattern is normal. Distal tip of nasogastric tube is seen in proximal stomach. Residual contrast is seen in the colon. There is no evidence of free air. No radio-opaque calculi or other significant radiographic abnormality is seen. IMPRESSION: No evidence of bowel obstruction or ileus. Electronically Signed   By: Marijo Conception M.D.   On: 10/17/2020 08:22    Anti-infectives: Anti-infectives (From admission, onward)   None       Assessment/Plan Stage IV pancreatic cancer Malignant SBO - patient not improving with conservative therapy and did not tolerate clamping trials - prealbumin 12.7, agree with TPN - appreciate palliative following for GOC - Pt wishes to pursue aggressive care for now - Dr. Johney Maine to discuss possible interventions with patient today and make further recommendations for care. Consideration for bowel resection with gastrostomy placement vs only gastrostomy placement.   FEN: ice chips for comfort, TPN, NGT to LIWS VTE: lovenox ID: no current abx  LOS: 4 days    Norm Parcel , [facility] Surgery 10/18/2020, 9:25 AM Please see Amion for pager number during day hours 7:00am-4:30pm

## 2020-10-19 DIAGNOSIS — C786 Secondary malignant neoplasm of retroperitoneum and peritoneum: Secondary | ICD-10-CM | POA: Diagnosis not present

## 2020-10-19 DIAGNOSIS — Z515 Encounter for palliative care: Secondary | ICD-10-CM

## 2020-10-19 LAB — MAGNESIUM: Magnesium: 2 mg/dL (ref 1.7–2.4)

## 2020-10-19 LAB — COMPREHENSIVE METABOLIC PANEL
ALT: 48 U/L — ABNORMAL HIGH (ref 0–44)
AST: 46 U/L — ABNORMAL HIGH (ref 15–41)
Albumin: 3 g/dL — ABNORMAL LOW (ref 3.5–5.0)
Alkaline Phosphatase: 115 U/L (ref 38–126)
Anion gap: 9 (ref 5–15)
BUN: 38 mg/dL — ABNORMAL HIGH (ref 8–23)
CO2: 21 mmol/L — ABNORMAL LOW (ref 22–32)
Calcium: 8.7 mg/dL — ABNORMAL LOW (ref 8.9–10.3)
Chloride: 108 mmol/L (ref 98–111)
Creatinine, Ser: 0.6 mg/dL (ref 0.44–1.00)
GFR, Estimated: >60 mL/min (ref >60)
Glucose, Bld: 128 mg/dL — ABNORMAL HIGH (ref 70–99)
Potassium: 3.9 mmol/L (ref 3.5–5.1)
Sodium: 138 mmol/L (ref 135–145)
Total Bilirubin: 1 mg/dL (ref 0.3–1.2)
Total Protein: 6.4 g/dL — ABNORMAL LOW (ref 6.5–8.1)

## 2020-10-19 LAB — CBC WITH DIFFERENTIAL/PLATELET
Abs Immature Granulocytes: 0.06 10*3/uL (ref 0.00–0.07)
Basophils Absolute: 0.1 10*3/uL (ref 0.0–0.1)
Basophils Relative: 0 %
Eosinophils Absolute: 0.1 10*3/uL (ref 0.0–0.5)
Eosinophils Relative: 1 %
HCT: 35.8 % — ABNORMAL LOW (ref 36.0–46.0)
Hemoglobin: 11.3 g/dL — ABNORMAL LOW (ref 12.0–15.0)
Immature Granulocytes: 0 %
Lymphocytes Relative: 63 %
Lymphs Abs: 12.1 10*3/uL — ABNORMAL HIGH (ref 0.7–4.0)
MCH: 30.6 pg (ref 26.0–34.0)
MCHC: 31.6 g/dL (ref 30.0–36.0)
MCV: 97 fL (ref 80.0–100.0)
Monocytes Absolute: 0.2 10*3/uL (ref 0.1–1.0)
Monocytes Relative: 1 %
Neutro Abs: 6.7 10*3/uL (ref 1.7–7.7)
Neutrophils Relative %: 35 %
Platelets: 375 10*3/uL (ref 150–400)
RBC: 3.69 MIL/uL — ABNORMAL LOW (ref 3.87–5.11)
RDW: 17.5 % — ABNORMAL HIGH (ref 11.5–15.5)
WBC: 19.1 10*3/uL — ABNORMAL HIGH (ref 4.0–10.5)
nRBC: 0 % (ref 0.0–0.2)

## 2020-10-19 LAB — PHOSPHORUS: Phosphorus: 3 mg/dL (ref 2.5–4.6)

## 2020-10-19 MED ORDER — MORPHINE SULFATE (CONCENTRATE) 10 MG/0.5ML PO SOLN
10.0000 mg | ORAL | Status: DC | PRN
  Administered 2020-10-20: 10 mg via SUBLINGUAL
  Filled 2020-10-19: qty 0.5

## 2020-10-19 MED ORDER — TRAVASOL 10 % IV SOLN
INTRAVENOUS | Status: DC
  Filled 2020-10-19: qty 1056

## 2020-10-19 MED ORDER — FENTANYL 25 MCG/HR TD PT72
1.0000 | MEDICATED_PATCH | TRANSDERMAL | Status: DC
  Administered 2020-10-19: 1 via TRANSDERMAL
  Filled 2020-10-19: qty 1

## 2020-10-19 NOTE — Progress Notes (Signed)
Tamara Powell has the NG tube hooked back up to suction.  She was having problems with vomiting yesterday.  Hopefully, she will have the G-tube placed today or tomorrow at the latest.  She does have more in the way of pain.  I think that she probably would benefit from a Duragesic patch.  I would put one on her.  We will try 25 mcg patch.  Her husband was with her today.  He understands what is going on.  They both are in agreement of our plans to move ahead with the G-tube and get the NG tube out.  Hospice apparently will be coming out to their house today.  She has had no problems with bleeding.  There is no fever.  She still has a sore throat from the NG tube.  Her labs show white cell count 19.  Hemoglobin 11.3.  Platelet count 375,000.  The elevated white cell count is more reflective of her CLL any infection.  Her albumin is 3.  Her creatinine is 0.6.  Her sodium is 138.  On exam, I still cannot hear any bowel sounds.  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  The NG tube is critical right now.  This really is preventing her from throwing up.  When she gets the G-tube in, this will make life a whole lot better for her because the NG tube can then come out.  We still are looking forward hospice for when she goes home.  Again they will come out to her house today and see how everything looks.  Hopefully, should be able to go home this weekend if everything gets done is in place for her to go home.  I appreciate the outstanding care that she is getting from all the staff up on 5 E.  Lattie Haw, MD  Psalm 62:2

## 2020-10-19 NOTE — Progress Notes (Signed)
[facility])        This patient was referred yesterday for hospice services at home after discharge. Per [employer] manager, this referral was given in error to [facility].  [facility] will close this referral at this time.  Please feel free to reach out to [facility] should this change.  Thank you for the opportunity to participate in this patient's care.     Domenic Moras, BSN, RN [facility] Liaison [phone]   [phone] (24h on call)

## 2020-10-19 NOTE — Progress Notes (Signed)
IR consulted by Dr. Marin Olp for possible image-guided percutaneous gastrostomy tube placement.  Case/images have been reviewed by Dr. Earleen Newport who approves procedure. Please see consult note from Brynda Greathouse, PA-C on 10/18/2020 for further information.  Due to IR scheduling, unable to accommodate procedure today. Plan for image-guided percutaneous gastrostomy tube placement in IR tentatively for tomorrow 10/20/2020 pending IR scheduling. Patient will be NPO at midnight. Hold Lovenox until post-procedure.  Discussed above with patient in her room. Accompanied by husband and additional female family member at bedside. They are aware of tentative procedure for tomorrow.  Risks and benefits discussed with the patient including, but not limited to the need for a barium enema during the procedure, bleeding, infection, peritonitis, or damage to adjacent structures. All of the patient's questions were answered, patient is agreeable to proceed. Consent signed and in chart.   Tamara Powell Tamara Yanda, PA-C 10/19/2020, 10:53 AM

## 2020-10-19 NOTE — Progress Notes (Signed)
Daily Progress Note   Patient Name: Tamara Powell       Date: 10/19/2020 DOB: 1945/10/17  Age: 75 y.o. MRN#: [id number] Attending Physician: Bonnielee Haff, MD Primary Care Physician: Angelina Sheriff, MD Admit Date: 10/13/2020  Reason for Consultation/Follow-up: Establishing goals of care  Subjective: Patient is resting in bed.  Spouse at bedside.  She is asking about when she will go to radiology for G-tube placement.  She has established contact with hospice services.  Length of Stay: 5  Current Medications: Scheduled Meds:  . bisacodyl  10 mg Rectal Daily  . chlorhexidine  15 mL Mouth Rinse BID  . Chlorhexidine Gluconate Cloth  6 each Topical Daily  . fentaNYL  1 patch Transdermal Q72H  . mouth rinse  15 mL Mouth Rinse q12n4p    Continuous Infusions: . TPN ADULT (ION) 80 mL/hr at 10/18/20 1732  . TPN ADULT (ION)      PRN Meds: acetaminophen **OR** acetaminophen, albuterol, alum & mag hydroxide-simeth, ipratropium, lip balm, magic mouthwash, menthol-cetylpyridinium, morphine injection, morphine injection, morphine CONCENTRATE, ondansetron (ZOFRAN) IV, ondansetron **OR** ondansetron (ZOFRAN) IV, phenol, simethicone, sodium chloride flush, traZODone  Physical Exam         Appears thin, sitting up in bed no distress has NG tube regular work of breathing awake alert oriented abdomen not distended  Vital Signs: BP 106/76   Pulse (!) 103   Temp 97.8 F (36.6 C) (Oral)   Resp 14   Ht 5\' 8"  (1.727 m)   Wt 60.6 kg   SpO2 97%   BMI 20.31 kg/m  SpO2: SpO2: 97 % O2 Device: O2 Device: Room Air O2 Flow Rate:    Intake/output summary:   Intake/Output Summary (Last 24 hours) at 10/19/2020 1344 Last data filed at 10/19/2020 0910 Gross per 24 hour  Intake 2034.6 ml  Output  1000 ml  Net 1034.6 ml   LBM: Last BM Date: 10/18/20 Baseline Weight: Weight: 61.7 kg Most recent weight: Weight: 60.6 kg     Palliative performance scale 50% Palliative Assessment/Data:    Flowsheet Rows   Flowsheet Row Most Recent Value  Intake Tab   Referral Department Hospitalist  Unit at Time of Referral Med/Surg Unit  Palliative Care Primary Diagnosis Cancer  Date Notified 10/15/20  Palliative Care Type New Palliative care  Reason for referral Clarify Goals of Care  Date of Admission 10/13/20  Date first seen by Palliative Care 10/16/20  # of days Palliative referral response time 1 Day(s)  # of days IP prior to Palliative referral 2  Clinical Assessment   Palliative Performance Scale Score 40%  Psychosocial & Spiritual Assessment   Palliative Care Outcomes   Patient/Family meeting held? Yes  Who was at the meeting? Patient, husband      Patient Active Problem List   Diagnosis Date Noted  . 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 Small bowel obstruction ([place]) 10/13/2020  . Pancreatic cancer metastasized to liver ([place]) 07/25/2020  . 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 Goals of care, counseling/discussion 07/19/2020  . Iron deficiency anemia 11/14/2016  . CLL (chronic lymphocytic leukemia) ([place]) 06/02/2012  . 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 Marin Olp, agree fully with his assessment and recommendations, patient already has established contact with hospice liaison's, recommend home with hospice, no further recommendations from palliative team, continue pain and on pain symptom management as outlined by Dr. Marin Olp.  Code  Status:    Code Status Orders  (From admission, onward)         Start     Ordered   10/18/20 0702  Do not attempt resuscitation (DNR)  Continuous       Question Answer Comment  In the event of cardiac or respiratory ARREST Do not call a "code blue"   In the event of cardiac or respiratory ARREST Do not perform Intubation, CPR, defibrillation or ACLS   In the event of cardiac or respiratory ARREST Use medication by any route, position, wound care, and other measures to relive pain and suffering. May use oxygen, suction and manual treatment of airway obstruction as needed for comfort.      10/18/20 0703        Code Status History    Date Active Date Inactive Code Status Order ID Comments User Context   10/14/2020 0129 10/18/2020 0703 Full Code [id number]  Harvie Bridge, DO Inpatient   Advance Care Planning Activity       Prognosis:   < 6 months  Discharge Planning:  Home with Hospice  Care plan was discussed with patient and interdisciplinary team  Thank you for allowing the Palliative Medicine Team to assist in the care of this patient.   Time In: 1300 Time Out: 1325 Total Time 25 Prolonged Time Billed No.       Greater than 50%  of this time was spent counseling and coordinating care related to the above assessment and plan.  Loistine Chance, MD  Please contact Palliative Medicine Team phone at [phone] for questions and concerns.

## 2020-10-19 NOTE — TOC Progression Note (Signed)
Transition of Care [facility]) - Progression Note    Patient Details  Name: Tamara Powell MRN: [id number] Date of Birth: Jan 26, 1946  [facility]) CM/SW Contact  Aneli Zara, Juliann Pulse, RN Phone Number: 10/19/2020, 12:46 PM  Clinical Narrative:Confirmed w/spouse Tamara Powell-they want home w/[facility] [place] [facility] [place] [facility] Cheri will eval. dme needed:rw,shwer chair.For palliative PEG in am. DME to arrive in home prior d/c.PTAR for transport.    Expected Discharge Plan: Home w Hospice Care Barriers to Discharge: Continued Medical Work up  Expected Discharge Plan and Services Expected Discharge Plan: [place]   Discharge Planning Services: CM Consult Post Acute Care Choice: Hospice Living arrangements for the past 2 months: [place] Date [place]: 10/19/20 Time [place]: 1245 Representative spoke with at [place]: [place] ([place]) Interventions    Readmission Risk Interventions No flowsheet data found.

## 2020-10-19 NOTE — Progress Notes (Signed)
PHARMACY - TOTAL PARENTERAL NUTRITION CONSULT NOTE   Indication: Small bowel obstruction  Patient Measurements: Height: 5\' 8"  (172.7 cm) Weight: 60.6 kg (133 lb 9.6 oz) IBW/kg (Calculated) : 63.9 TPN AdjBW (KG): 61.7 Body mass index is 20.31 kg/m.  Recent Labs    10/18/20 0454 10/19/20 0510  NA 139 138  K 4.0 3.9  CL 107 108  CO2 21* 21*  GLUCOSE 136* 128*  BUN 37* 38*  CREATININE 0.56 0.60  CALCIUM 8.9 8.7*  PHOS 3.6 3.0  MG 1.9 2.0  ALBUMIN 2.9* 3.0*  ALKPHOS 104 115  AST 30 46*  ALT 27 48*  BILITOT 0.8 1.0  PREALBUMIN 13.4*  --    Assessment:  75 y/o F with metastatic pancreatic cancer and recent colitis presented with vomiting and was found to have SBO. Orders received to begin TPN with pharmacy assistance.  Glucose / Insulin: no hx of DM. CBGs < 150. SSI dc'd 3/14 Electrolytes:  WNL except CO2 remains low at 21 Renal:  SCr WNL,  BUN 38 Hepatic: LFTs mildly elevated, Tbili wnl. Trig wnl. Prealbumin 11.7/12.2/12.7/13.4 Intake / Output; MIVF: NG 1571mL out yesterday. UOP x2 and stool output x1, no volume recorded.   Inaccurate net I/O. GI Imaging: 3/11: high-grade proximal SBO 3/16 GYK:ZLDJT of dilated bowel in the left mid abdomen. Question ileus versus a degree of obstruction. Free air evident. GI Surgeries / Procedures: 3/17 Planning gastrostomy tube placement in IR  Central access: [facility] 08/07/20 TPN start date: 10/14/2020  Nutritional Goals  Kcal: 1800-2000, Protein 90-105 g, Fluid > 1.8 L  TPN - 69ml/hr with 105.6g of protein, 17% dextrose, 20 g of lipids. This will give 1916 kcal  Current Nutrition:  NPO, TPN  Plan:  Continue TPN @ goal rate of 80 mL/hr  Electrolytes in TPN: Na 20mEq/L, K 29mEq/L, Ca 26mEq/L, Mg 43mEq/L, resume Phos 5 mEq/L today. Cl:Ac 1:2 Add standard MVI daily and trace elements to TPN SSI/CBGs dc'd Monitor TPN labs, BMET, Mg & Phos as needed  Gretta Arab PharmD, BCPS Clinical Pharmacist [facility] main pharmacy [phone] 10/19/2020  8:01 AM

## 2020-10-19 NOTE — Progress Notes (Signed)
PROGRESS NOTE    Tamara Powell  JFH:[id number] DOB: Sep 23, 1945 DOA: 10/13/2020 PCP: Angelina Sheriff, MD   Brief Narrative:  75 year old female with past medical history of pancreatic cancer with mets to liver (follows with Dr. Marin Olp) presenting to [place] emergency department with 2-day history of abdominal pain nausea and vomiting. Of note, patient presented to [facility] 1 week ago with similar symptoms, was admitted for 3 days, diagnosed with colitis and discharged with antibiotics. Patient now presents with recurrent abdominal pain nausea and vomiting. CT imaging of the abdomen reveals high-grade small bowel obstruction. Case discussed with Dr. Brantley Stage with general surgery who recommends hospitalization - general surgery and oncology to follow.Outpatient plan to start new chemotherapy in the next week. 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 General surgery and medical oncology has been following.  NG tube was placed.  Patient has not had any return of bowel function.  NG tube remains in place.  Plan is for G-tube placement for palliative purposes.  Interventional radiology was consulted.  Plan is to transition her to home with hospice. Continue TPN per Dr. Marin Olp.  Plan is to discontinue TPN at discharge.  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  See discussion above.  Plan is for transition to hospice once G-tube has been placed. Leukocytosis likely due to malignancy.  Normocytic anemia Likely due to malignancy.  No evidence of overt bleeding.  Hemoglobin is stable.  Goals of care Palliative care was consulted.  Medical oncology also having discussions with patient and family.  After further discussions plan is for transition to hospice at home.   DVT prophylaxis: Lovenox Code  Status: Full Family Communication: Discussed with patient and her husband who was at bedside. Disposition: Home with hospice  Status is: Inpatient  Dispo: The patient is from: Home              Anticipated d/c is to: Home                           Patient currently not medically stable for discharge  Consultants:   General surgery  Oncology  Interventional radiology  Procedures:   None  Antimicrobials:  None indicated  Subjective: Patient did have some abdominal pain early this morning for which she was given morphine.  Denies any nausea or vomiting. Patient's husband is at the bedside.     Objective: Vitals:   10/18/20 0451 10/18/20 1604 10/18/20 2111 10/19/20 0448  BP: 123/62 130/70 (!) 125/59 123/70  Pulse: 99 (!) 104 (!) 101 (!) 102  Resp: 20 18 18 18   Temp: 97.9 F (36.6 C) 97.9 F (36.6 C) 98.5 F (36.9 C) 97.9 F (36.6 C)  TempSrc: Oral Oral Axillary Oral  SpO2: 97% 98% 97% 97%  Weight:      Height:        Intake/Output Summary (Last 24 hours) at 10/19/2020 1105 Last data filed at 10/19/2020 0910 Gross per 24 hour  Intake 2034.6 ml  Output 1000 ml  Net 1034.6 ml   Filed Weights   10/13/20 1726 10/14/20 0050  Weight: 61.7 kg 60.6 kg    Examination:  General appearance: Awake alert.  In no distress NG tube is noted Resp: Clear to auscultation bilaterally.  Normal effort Cardio: S1-S2 is normal regular.  No  S3-S4.  No rubs murmurs or bruit GI: Abdomen is soft.  No bowel sounds heard.  Nontender.   Extremities: No edema.   Neurologic: Alert and oriented x3.  No focal neurological deficits.     Data Reviewed: I have personally reviewed following labs and imaging studies  CBC: Recent Labs  Lab 10/13/20 1826 10/14/20 0944 10/15/20 0526 10/16/20 0444 10/18/20 0454 10/19/20 0510  WBC 16.8* 12.3* 13.6* 13.7* 16.4* 19.1*  NEUTROABS 4.0  --  3.6 6.2 6.1 6.7  HGB 12.1 10.5* 11.0* 11.1* 11.1* 11.3*  HCT 36.5 32.6* 34.4* 35.7* 35.3* 35.8*  MCV  92.9 95.6 95.8 97.3 98.9 97.0  PLT 385 299 345 354 361 696   Basic Metabolic Panel: Recent Labs  Lab 10/15/20 0526 10/16/20 0444 10/17/20 0453 10/18/20 0454 10/19/20 0510  NA 138 140 140 139 138  K 3.8 4.4 4.7 4.0 3.9  CL 102 104 107 107 108  CO2 26 24 23  21* 21*  GLUCOSE 121* 142* 138* 136* 128*  BUN 16 29* 38* 37* 38*  CREATININE 0.56 0.58 0.66 0.56 0.60  CALCIUM 8.7* 9.2 8.9 8.9 8.7*  MG 2.1 2.1 2.2 1.9 2.0  PHOS 5.1* 3.9 3.7 3.6 3.0   GFR: Estimated Creatinine Clearance: 58.1 mL/min (by C-G formula based on SCr of 0.6 mg/dL). Liver Function Tests: Recent Labs  Lab 10/14/20 0944 10/15/20 0526 10/16/20 0444 10/18/20 0454 10/19/20 0510  AST 15 16 16 30  46*  ALT 14 13 13 27  48*  ALKPHOS 84 86 80 104 115  BILITOT 0.8 0.7 0.5 0.8 1.0  PROT 5.7* 5.8* 6.0* 6.0* 6.4*  ALBUMIN 2.8* 3.0* 2.9* 2.9* 3.0*   Recent Labs  Lab 10/13/20 1826  LIPASE 33   CBG: Recent Labs  Lab 10/16/20 1657 10/17/20 1148 10/17/20 1637 10/17/20 2357 10/18/20 0546  GLUCAP 144* 129* 116* 125* 131*   Sepsis Labs: Recent Labs  Lab 10/13/20 2125  LATICACIDVEN 0.5    Recent Results (from the past 240 hour(s))  Resp Panel by RT-PCR (Flu A&B, Covid) Nasopharyngeal Swab     Status: None   Collection Time: 10/13/20 10:02 PM   Specimen: Nasopharyngeal Swab; Nasopharyngeal(NP) swabs in vial transport medium  Result Value Ref Range Status   SARS Coronavirus 2 by RT PCR NEGATIVE NEGATIVE Final    Comment: (NOTE) SARS-CoV-2 target nucleic acids are NOT DETECTED.  The SARS-CoV-2 RNA is generally detectable in upper respiratory specimens during the acute phase of infection. The lowest concentration of SARS-CoV-2 viral copies this assay can detect is 138 copies/mL. A negative result does not preclude SARS-Cov-2 infection and should not be used as the sole basis for treatment or other patient management decisions. A negative result may occur with  improper specimen collection/handling,  submission of specimen other than nasopharyngeal swab, presence of viral mutation(s) within the areas targeted by this assay, and inadequate number of viral copies(<138 copies/mL). A negative result must be combined with clinical observations, patient history, and epidemiological information. The expected result is Negative.  Fact Sheet for Patients:  EntrepreneurPulse.com.au  Fact Sheet for Healthcare Providers:  IncredibleEmployment.be  This test is no t yet approved or cleared by the [place] FDA and  has been authorized for detection and/or diagnosis of SARS-CoV-2 by FDA under an Emergency Use Authorization (EUA). This EUA will remain  in effect (meaning this test can be used) for the duration of the COVID-19 declaration under Section 564(b)(1) of the Act, 21 U.S.C.section 360bbb-3(b)(1), unless the authorization is terminated  or  revoked sooner.       Influenza A by PCR NEGATIVE NEGATIVE Final   Influenza B by PCR NEGATIVE NEGATIVE Final    Comment: (NOTE) The Xpert Xpress SARS-CoV-2/FLU/RSV plus assay is intended as an aid in the diagnosis of influenza from Nasopharyngeal swab specimens and should not be used as a sole basis for treatment. Nasal washings and aspirates are unacceptable for Xpert Xpress SARS-CoV-2/FLU/RSV testing.  Fact Sheet for Patients: EntrepreneurPulse.com.au  Fact Sheet for Healthcare Providers: IncredibleEmployment.be  This test is not yet approved or cleared by the [place] FDA and has been authorized for detection and/or diagnosis of SARS-CoV-2 by FDA under an Emergency Use Authorization (EUA). This EUA will remain in effect (meaning this test can be used) for the duration of the COVID-19 declaration under Section 564(b)(1) of the Act, 21 U.S.C. section 360bbb-3(b)(1), unless the authorization is terminated or revoked.  Performed at [facility], [address], [place], [place] [address]          Radiology Studies: DG Abd Portable 1V  Result Date: 10/18/2020 CLINICAL DATA:  Small bowel obstruction EXAM: PORTABLE ABDOMEN - 1 VIEW COMPARISON:  October 17, 2020 FINDINGS: Nasogastric tube tip and side port are in the stomach. There is dilated small bowel in the mid left abdomen. A small amount of contrast is seen in colon. No air-fluid levels. No free air evident. Patient is status post kyphoplasty at L1. Compression of the L2 vertebral body is stable. IMPRESSION: Nasogastric tube tip and side port in stomach. Loops of dilated bowel in the left mid abdomen. Question ileus versus a degree of obstruction. Free air evident. Electronically Signed   By: Lowella Grip III M.D.   On: 10/18/2020 07:59        Scheduled Meds:  bisacodyl  10 mg Rectal Daily   chlorhexidine  15 mL Mouth Rinse BID   Chlorhexidine Gluconate Cloth  6 each Topical Daily   fentaNYL  1 patch Transdermal Q72H   mouth rinse  15 mL Mouth Rinse q12n4p   Continuous Infusions:  TPN ADULT (ION) 80 mL/hr at 10/18/20 1732   TPN ADULT (ION)       LOS: 5 days    [facility]  If 7PM-7AM, please contact night-coverage [email or web address]  10/19/2020, 11:05 AM

## 2020-10-20 ENCOUNTER — Encounter: Payer: Self-pay | Admitting: *Deleted

## 2020-10-20 ENCOUNTER — Inpatient Hospital Stay (HOSPITAL_COMMUNITY): Payer: Medicare Other

## 2020-10-20 DIAGNOSIS — R531 Weakness: Secondary | ICD-10-CM

## 2020-10-20 DIAGNOSIS — C259 Malignant neoplasm of pancreas, unspecified: Secondary | ICD-10-CM | POA: Diagnosis not present

## 2020-10-20 DIAGNOSIS — R52 Pain, unspecified: Secondary | ICD-10-CM

## 2020-10-20 DIAGNOSIS — C787 Secondary malignant neoplasm of liver and intrahepatic bile duct: Secondary | ICD-10-CM | POA: Diagnosis not present

## 2020-10-20 DIAGNOSIS — C786 Secondary malignant neoplasm of retroperitoneum and peritoneum: Secondary | ICD-10-CM | POA: Diagnosis not present

## 2020-10-20 HISTORY — PX: IR GASTROSTOMY TUBE MOD SED: IMG625

## 2020-10-20 LAB — CBC WITH DIFFERENTIAL/PLATELET
Abs Immature Granulocytes: 0.08 10*3/uL — ABNORMAL HIGH (ref 0.00–0.07)
Basophils Absolute: 0.1 10*3/uL (ref 0.0–0.1)
Basophils Relative: 0 %
Eosinophils Absolute: 0.2 10*3/uL (ref 0.0–0.5)
Eosinophils Relative: 1 %
HCT: 35.7 % — ABNORMAL LOW (ref 36.0–46.0)
Hemoglobin: 11.3 g/dL — ABNORMAL LOW (ref 12.0–15.0)
Immature Granulocytes: 0 %
Lymphocytes Relative: 65 %
Lymphs Abs: 12.6 10*3/uL — ABNORMAL HIGH (ref 0.7–4.0)
MCH: 30.5 pg (ref 26.0–34.0)
MCHC: 31.7 g/dL (ref 30.0–36.0)
MCV: 96.5 fL (ref 80.0–100.0)
Monocytes Absolute: 0.2 10*3/uL (ref 0.1–1.0)
Monocytes Relative: 1 %
Neutro Abs: 6.6 10*3/uL (ref 1.7–7.7)
Neutrophils Relative %: 33 %
Platelets: 373 10*3/uL (ref 150–400)
RBC: 3.7 MIL/uL — ABNORMAL LOW (ref 3.87–5.11)
RDW: 17.3 % — ABNORMAL HIGH (ref 11.5–15.5)
WBC: 19.7 10*3/uL — ABNORMAL HIGH (ref 4.0–10.5)
nRBC: 0 % (ref 0.0–0.2)

## 2020-10-20 LAB — SURGICAL PATHOLOGY

## 2020-10-20 MED ORDER — HYDROMORPHONE HCL 1 MG/ML IJ SOLN
1.0000 mg | INTRAMUSCULAR | Status: DC | PRN
  Administered 2020-10-20: 1 mg via INTRAVENOUS
  Filled 2020-10-20: qty 1

## 2020-10-20 MED ORDER — LIDOCAINE VISCOUS HCL 2 % MT SOLN
OROMUCOSAL | Status: AC
  Filled 2020-10-20: qty 15

## 2020-10-20 MED ORDER — FENTANYL CITRATE (PF) 100 MCG/2ML IJ SOLN
INTRAMUSCULAR | Status: AC | PRN
  Administered 2020-10-20 (×2): 50 ug via INTRAVENOUS

## 2020-10-20 MED ORDER — CEFAZOLIN SODIUM-DEXTROSE 2-4 GM/100ML-% IV SOLN
INTRAVENOUS | Status: AC
  Administered 2020-10-20: 2000 mg
  Filled 2020-10-20: qty 100

## 2020-10-20 MED ORDER — LIDOCAINE-EPINEPHRINE 1 %-1:100000 IJ SOLN
INTRAMUSCULAR | Status: AC | PRN
  Administered 2020-10-20: 10 mL

## 2020-10-20 MED ORDER — TRAVASOL 10 % IV SOLN
INTRAVENOUS | Status: DC
  Filled 2020-10-20: qty 1056

## 2020-10-20 MED ORDER — TRAZODONE HCL 50 MG PO TABS: 25.0000 mg | ORAL_TABLET | Freq: Every evening | ORAL | 1 refills | Status: AC | PRN

## 2020-10-20 MED ORDER — MIDAZOLAM HCL 2 MG/2ML IJ SOLN
INTRAMUSCULAR | Status: AC
  Filled 2020-10-20: qty 4

## 2020-10-20 MED ORDER — MIDAZOLAM HCL 2 MG/2ML IJ SOLN
INTRAMUSCULAR | Status: AC | PRN
  Administered 2020-10-20 (×2): 1 mg via INTRAVENOUS

## 2020-10-20 MED ORDER — MORPHINE SULFATE (CONCENTRATE) 10 MG/0.5ML PO SOLN: 10.0000 mg | ORAL | 0 refills | Status: AC | PRN

## 2020-10-20 MED ORDER — FENTANYL 25 MCG/HR TD PT72: 1.0000 | MEDICATED_PATCH | TRANSDERMAL | 0 refills | Status: AC

## 2020-10-20 MED ORDER — GLUCAGON HCL RDNA (DIAGNOSTIC) 1 MG IJ SOLR
INTRAMUSCULAR | Status: AC
  Filled 2020-10-20: qty 1

## 2020-10-20 MED ORDER — LIDOCAINE HCL 1 % IJ SOLN
INTRAMUSCULAR | Status: AC
  Filled 2020-10-20: qty 20

## 2020-10-20 MED ORDER — ENOXAPARIN SODIUM 40 MG/0.4ML ~~LOC~~ SOLN: 40.0000 mg | Freq: Every day | SUBCUTANEOUS | Status: DC

## 2020-10-20 MED ORDER — MORPHINE SULFATE (CONCENTRATE) 10 MG/0.5ML PO SOLN: 10.0000 mg | ORAL | 0 refills | Status: DC | PRN

## 2020-10-20 MED ORDER — GLUCAGON HCL (RDNA) 1 MG IJ SOLR
INTRAMUSCULAR | Status: AC | PRN
  Administered 2020-10-20: 1 mg via INTRAVENOUS

## 2020-10-20 MED ORDER — IOHEXOL 300 MG/ML  SOLN
50.0000 mL | Freq: Once | INTRAMUSCULAR | Status: AC | PRN
  Administered 2020-10-20: 15 mL

## 2020-10-20 MED ORDER — FENTANYL CITRATE (PF) 100 MCG/2ML IJ SOLN
INTRAMUSCULAR | Status: AC
  Filled 2020-10-20: qty 2

## 2020-10-20 MED ORDER — HEPARIN SOD (PORK) LOCK FLUSH 100 UNIT/ML IV SOLN
500.0000 [IU] | INTRAVENOUS | Status: AC | PRN
  Administered 2020-10-20: 500 [IU]

## 2020-10-20 NOTE — TOC Transition Note (Signed)
Transition of Care [facility]) - CM/SW Discharge Note   Patient Details  Name: ALEIGHNA WOJTAS MRN: [id number] Date of Birth: 1945-11-15  [facility]) CM/SW Contact:  Dessa Phi, RN Phone Number: 10/20/2020, 1:33 PM   Clinical Narrative: d/c home w/[facility] [place] [facility]-rep Cheri-accepted-they will have home nurse to eval at patient's home today @ 5p-spouse [name] aware-has all dme in home;will transport home on own. No further CM needs.      Final next level of care: Home w Hospice Care Barriers to Discharge: No Barriers Identified   Patient Goals and CMS Choice Patient states their goals for this hospitalization and ongoing recovery are:: go home CMS Medicare.gov Compare Post Acute Care list provided to:: Patient Choice offered to / list presented to : [facility]  Discharge Placement                       Discharge Plan and Services   Discharge Planning Services: CM Consult Post Acute Care Choice: Hospice                      [facility] Agency: [facility] Date [place]: 10/19/20 Time [place]: [place] Representative spoke with at [place]: [place] Determinants of Health ([place]) Interventions     Readmission Risk Interventions No flowsheet data found.

## 2020-10-20 NOTE — Progress Notes (Signed)
Patient's husband called seeking some clarification with hospital procedure and for emotional support. [name] really just wants patient to be home so she can be able to die at home per "her plan". Listened to patient and allowed him to express his grief. [name] knows that he can call with any needs.   Oncology Nurse Navigator Documentation  Oncology Nurse Navigator Flowsheets 10/20/2020  Confirmed Diagnosis Date -  Diagnosis Status -  Phase of Treatment -  Chemotherapy Pending- Reason: -  Chemotherapy Actual Start Date: -  Navigator Follow Up Date: -  Navigator Follow Up Reason: -  Navigation Complete Date: -  Post Navigation: Continue to Follow Patient? -  Reason Not Navigating Patient: -  Production assistant, radio Encounter Type Telephone  Telephone Incoming Call  Treatment Initiated Date -  Patient Visit Type MedOnc  Treatment Phase Other  Barriers/Navigation Needs Coordination of Care;Education  Education -  Interventions Psycho-Social Support  Acuity Level 2-Minimal Needs (1-2 Barriers Identified)  Coordination of Care -  Education Method -  Support Groups/Services Friends and Family  Time Spent with Patient 30

## 2020-10-20 NOTE — Discharge Summary (Signed)
[facility]  Physician Discharge Summary   Patient ID: Tamara Powell MRN: [id number] DOB/AGE: Dec 20, 1945 75 y.o.  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 Hospice to follow at home   Home Health: Home hospice   CODE STATUS: DNR  DISCHARGE CONDITION: fair  Diet recommendation: Comfort feeds  INITIAL HISTORY: 75 year old female with past medical history of pancreatic cancerwith mets to liver(follows with Dr. Marin Olp) presenting to [place] emergency department with 2-day history of abdominal pain nausea and vomiting. Of note, patient presented to [facility] 1 week ago with similar symptoms, was admitted for 3 days, diagnosed with colitis and discharged with antibiotics. Patient now presents with recurrent abdominal pain nausea and vomiting. CT imaging of the abdomen reveals high-grade small bowel obstruction. Case discussed with Dr. Brantley Stage with general surgery who recommends hospitalization - general surgery and oncology to follow.Outpatient plan to start new chemotherapy in the next week. NG tube placed  Consultations:  General surgery  Oncology  Interventional radiology   Procedures: G-tube placement by [place]:   High grade SBO This is thought to be secondary to malignant obstruction from carcinomatosis. General surgery and medical oncology has been following.  NG tube was placed.  Patient has not had any return of bowel function.    After further discussions with medical oncology it was decided that patient will transition to hospice since her prognosis was thought to be poor.  A G-tube was placed for palliative purposes.  Patient to be followed by hospice at home.  TPN to be discontinued prior to discharge.  Pain medications  have been ordered.  Patient started on Duragesic patch and will be also discharged on morphine for breakthrough pain.  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  See discussion above.    Normocytic anemia Likely due to malignancy.  No evidence of overt bleeding.   Goals of care Palliative care was consulted.  Medical oncology also having discussions with patient and family.  After further discussions plan is for transition to hospice at home.  Patient is stable.  Pain is reasonably well controlled.  Okay for discharge home today.   PERTINENT LABS:  The results of significant diagnostics from this hospitalization (including imaging, microbiology, ancillary and laboratory) are listed below for reference.    Microbiology: Recent Results (from the past 240 hour(s))  Resp Panel by RT-PCR (Flu A&B, Covid) Nasopharyngeal Swab     Status: None   Collection Time: 10/13/20 10:02 PM   Specimen: Nasopharyngeal Swab; Nasopharyngeal(NP) swabs in vial transport medium  Result Value Ref Range Status   SARS Coronavirus 2 by RT PCR NEGATIVE NEGATIVE Final    Comment: (NOTE) SARS-CoV-2 target nucleic acids are NOT DETECTED.  The SARS-CoV-2 RNA is generally detectable in upper respiratory specimens during the acute phase of infection. The lowest concentration of SARS-CoV-2 viral copies this assay can detect is 138 copies/mL. A negative result does not preclude SARS-Cov-2 infection and should not be used as the sole basis for treatment or other patient management decisions. A negative result may occur with  improper specimen collection/handling, submission of specimen other than nasopharyngeal swab, presence of viral mutation(s) within the areas targeted by this assay, and inadequate number of viral copies(<138 copies/mL). A negative result must be combined with clinical observations,  patient history, and  epidemiological information. The expected result is Negative.  Fact Sheet for Patients:  EntrepreneurPulse.com.au  Fact Sheet for Healthcare Providers:  IncredibleEmployment.be  This test is no t yet approved or cleared by the [place] FDA and  has been authorized for detection and/or diagnosis of SARS-CoV-2 by FDA under an Emergency Use Authorization (EUA). This EUA will remain  in effect (meaning this test can be used) for the duration of the COVID-19 declaration under Section 564(b)(1) of the Act, 21 U.S.C.section 360bbb-3(b)(1), unless the authorization is terminated  or revoked sooner.       Influenza A by PCR NEGATIVE NEGATIVE Final   Influenza B by PCR NEGATIVE NEGATIVE Final    Comment: (NOTE) The Xpert Xpress SARS-CoV-2/FLU/RSV plus assay is intended as an aid in the diagnosis of influenza from Nasopharyngeal swab specimens and should not be used as a sole basis for treatment. Nasal washings and aspirates are unacceptable for Xpert Xpress SARS-CoV-2/FLU/RSV testing.  Fact Sheet for Patients: EntrepreneurPulse.com.au  Fact Sheet for Healthcare Providers: IncredibleEmployment.be  This test is not yet approved or cleared by the [place] FDA and has been authorized for detection and/or diagnosis of SARS-CoV-2 by FDA under an Emergency Use Authorization (EUA). This EUA will remain in effect (meaning this test can be used) for the duration of the COVID-19 declaration under Section 564(b)(1) of the Act, 21 U.S.C. section 360bbb-3(b)(1), unless the authorization is terminated or revoked.  Performed at [facility], [address] [place], [place] [address]      Labs:  COVID-19 Labs   Lab Results  Component Value Date   [place] NEGATIVE 10/13/2020      Basic Metabolic Panel: Recent Labs  Lab 10/15/20 0526 10/16/20 0444 10/17/20 0453 10/18/20 0454  10/19/20 0510  NA 138 140 140 139 138  K 3.8 4.4 4.7 4.0 3.9  CL 102 104 107 107 108  CO2 26 24 23  21* 21*  GLUCOSE 121* 142* 138* 136* 128*  BUN 16 29* 38* 37* 38*  CREATININE 0.56 0.58 0.66 0.56 0.60  CALCIUM 8.7* 9.2 8.9 8.9 8.7*  MG 2.1 2.1 2.2 1.9 2.0  PHOS 5.1* 3.9 3.7 3.6 3.0   Liver Function Tests: Recent Labs  Lab 10/14/20 0944 10/15/20 0526 10/16/20 0444 10/18/20 0454 10/19/20 0510  AST 15 16 16 30  46*  ALT 14 13 13 27  48*  ALKPHOS 84 86 80 104 115  BILITOT 0.8 0.7 0.5 0.8 1.0  PROT 5.7* 5.8* 6.0* 6.0* 6.4*  ALBUMIN 2.8* 3.0* 2.9* 2.9* 3.0*   Recent Labs  Lab 10/13/20 1826  LIPASE 33   CBC: Recent Labs  Lab 10/15/20 0526 10/16/20 0444 10/18/20 0454 10/19/20 0510 10/20/20 0457  WBC 13.6* 13.7* 16.4* 19.1* 19.7*  NEUTROABS 3.6 6.2 6.1 6.7 6.6  HGB 11.0* 11.1* 11.1* 11.3* 11.3*  HCT 34.4* 35.7* 35.3* 35.8* 35.7*  MCV 95.8 97.3 98.9 97.0 96.5  PLT 345 354 361 375 373    CBG: Recent Labs  Lab 10/16/20 1657 10/17/20 1148 10/17/20 1637 10/17/20 2357 10/18/20 0546  GLUCAP 144* 129* 116* 125* 131*     IMAGING STUDIES DG Abd 1 View  Result Date: 10/13/2020 CLINICAL DATA:  NG tube placement EXAM: ABDOMEN - 1 VIEW COMPARISON:  CT from same day FINDINGS: The tip of the NG tube projects over the gastric body. The side port is near the GE junction.the patient is status post prior vertebral augmentation of the L1 vertebral body. The bowel gas  pattern is nonspecific. IMPRESSION: The tip of the NG tube projects over the gastric body. Recommend advancing the tube further into the stomach by approximately 3-5 cm. Electronically Signed   By: Constance Holster M.D.   On: 10/13/2020 21:56   CT Chest W Contrast  Result Date: 10/09/2020 CLINICAL DATA:  Metastatic pancreatic cancer. EXAM: CT CHEST WITH CONTRAST TECHNIQUE: Multidetector CT imaging of the chest was performed during intravenous contrast administration. CONTRAST:  55mL OMNIPAQUE IOHEXOL 300 MG/ML  SOLN  COMPARISON:  CT abdomen/pelvis 09/25/2020 and PET-CT 08/01/2020 and chest CT 07/20/2020 FINDINGS: Cardiovascular: The heart is normal in size. No pericardial effusion. The aorta is normal in caliber. No dissection. The branch vessels are patent. Mediastinum/Nodes: Small scattered mediastinal hilar lymph nodes but no mass or overt lymphadenopathy. The esophagus is grossly normal. The thyroid gland is unremarkable. Lungs/Pleura: A few small scattered subpleural pulmonary nodules are stable. No new or progressive findings are identified. Stable basilar scarring changes. No pleural effusions or pleural nodules. Upper Abdomen: Stable diffuse hepatic metastatic disease. Musculoskeletal: No significant bony findings. No findings suspicious for osseous metastatic disease. Scattered hemangiomas are noted. Stable vertebral augmentation changes and L1. IMPRESSION: 1. Stable small scattered subpleural pulmonary nodules. No new or progressive findings. 2. Stable diffuse hepatic metastatic disease. 3. No mediastinal hilar mass or adenopathy. Aortic Atherosclerosis (ICD10-I70.0). Electronically Signed   By: Marijo Sanes M.D.   On: 10/09/2020 14:48   CT ABDOMEN PELVIS W CONTRAST  Result Date: 10/13/2020 CLINICAL DATA:  Lower abdominal pain with nausea and vomiting. EXAM: CT ABDOMEN AND PELVIS WITH CONTRAST TECHNIQUE: Multidetector CT imaging of the abdomen and pelvis was performed using the standard protocol following bolus administration of intravenous contrast. CONTRAST:  140mL OMNIPAQUE IOHEXOL 300 MG/ML  SOLN COMPARISON:  September 25, 2020 FINDINGS: Lower chest: Mild linear scarring and/or atelectasis is seen within the bilateral lung bases. Hepatobiliary: Numerous stable heterogeneous low-attenuation liver lesions are seen. Status post cholecystectomy. No biliary dilatation. Pancreas: Unremarkable. No pancreatic ductal dilatation or surrounding inflammatory changes. Spleen: Normal in size without focal abnormality.  Adrenals/Urinary Tract: Adrenal glands are unremarkable. Kidneys are normal, without renal calculi, focal lesion, or hydronephrosis. The urinary bladder is contracted and subsequently limited in evaluation. Stomach/Bowel: Stomach is within normal limits. Appendix appears normal. A segment of markedly dilated distal duodenum is seen within the left upper quadrant (maximum small bowel diameter of approximately 5.2 cm). An abrupt transition zone is noted within the medial aspect of the mid to upper left abdomen (axial CT images 33 through 40, CT series number 2). Noninflamed diverticula are seen throughout the sigmoid colon. Vascular/Lymphatic: No significant vascular findings are present. No enlarged abdominal or pelvic lymph nodes. Reproductive: Status post hysterectomy. No adnexal masses. Other: No abdominal wall hernia or abnormality. A mild amount of abdominal and pelvic free fluid is noted. Musculoskeletal: Chronic compression fracture deformities are seen at the levels of L1 and L2. IMPRESSION: 1. High-grade proximal small bowel obstruction. 2. Findings consistent with the patient's known liver metastasis. 3. Sigmoid diverticulosis. 4. Mild amount of abdominal and pelvic free fluid. 5. 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 Electronically Signed   By: Virgina Norfolk M.D.   On: 10/13/2020 19:49   IR GASTROSTOMY TUBE MOD SED  Result Date: 10/20/2020 INDICATION: History of metastatic pancreatic cancer, now with proximal malignant small-bowel obstruction. Please perform percutaneous gastrostomy tube for palliative ventilatory purposes. EXAM: PULL TROUGH GASTROSTOMY TUBE PLACEMENT COMPARISON:  CT abdomen and pelvis-10/13/2020 MEDICATIONS: Ancef 2 gm  IV; Antibiotics were administered within 1 hour of the procedure. Glucagon 1 mg IV CONTRAST:  Twenty mL of Omnipaque 300 administered into the gastric lumen. ANESTHESIA/SEDATION: Moderate (conscious) sedation was employed during this  procedure. A total of Versed 2 mg and Fentanyl 100 mcg was administered intravenously. Moderate Sedation Time: 12 minutes. The patient's level of consciousness and vital signs were monitored continuously by radiology nursing throughout the procedure under my direct supervision. FLUOROSCOPY TIME:  3 minutes, 36 seconds (30 mGy) COMPLICATIONS: None immediate. PROCEDURE: Informed written consent was obtained from the patient following explanation of the procedure, risks, benefits and alternatives. A time out was performed prior to the initiation of the procedure. Ultrasound scanning was performed to demarcate the edge of the left lobe of the liver. Maximal barrier sterile technique utilized including caps, mask, sterile gowns, sterile gloves, large sterile drape, hand hygiene and Betadine prep. The left upper quadrant was sterilely prepped and draped. An oral gastric catheter was inserted into the stomach under fluoroscopy. The existing nasogastric feeding tube was removed. The left costal margin and air opacified transverse colon were identified and avoided. Air was injected into the stomach for insufflation and visualization under fluoroscopy. Under sterile conditions a 17 gauge trocar needle was utilized to access the stomach percutaneously beneath the left subcostal margin after the overlying soft tissues were anesthetized with 1% Lidocaine with epinephrine. Needle position was confirmed within the stomach with aspiration of air and injection of small amount of contrast. A single T tack was deployed for gastropexy. Over an Amplatz guide wire, a 9-French sheath was inserted into the stomach. A snare device was utilized to capture the oral gastric catheter. The snare device was pulled retrograde from the stomach up the esophagus and out the oropharynx. The 20-French pull-through gastrostomy was connected to the snare device and pulled antegrade through the oropharynx down the esophagus into the stomach and then  through the percutaneous tract external to the patient. The gastrostomy was assembled externally. Contrast injection confirms position in the stomach. Several spot radiographic images were obtained in various obliquities for documentation. The patient tolerated procedure well without immediate post procedural complication. FINDINGS: After successful fluoroscopic guided placement, the gastrostomy tube is appropriately positioned with internal disc against the ventral aspect of the gastric lumen. IMPRESSION: Successful fluoroscopic insertion of a 20-French pull-through gastrostomy tube. The gastrostomy may be used immediately for medication administration and in 24 hrs for the initiation of feeds. Electronically Signed   By: Sandi Mariscal M.D.   On: 10/20/2020 09:51   DG Abd Portable 1V  Result Date: 10/18/2020 CLINICAL DATA:  Small bowel obstruction EXAM: PORTABLE ABDOMEN - 1 VIEW COMPARISON:  October 17, 2020 FINDINGS: Nasogastric tube tip and side port are in the stomach. There is dilated small bowel in the mid left abdomen. A small amount of contrast is seen in colon. No air-fluid levels. No free air evident. Patient is status post kyphoplasty at L1. Compression of the L2 vertebral body is stable. IMPRESSION: Nasogastric tube tip and side port in stomach. Loops of dilated bowel in the left mid abdomen. Question ileus versus a degree of obstruction. Free air evident. Electronically Signed   By: Lowella Grip III M.D.   On: 10/18/2020 07:59   DG Abd Portable 1V  Result Date: 10/16/2020 CLINICAL DATA:  Follow-up small bowel obstruction EXAM: PORTABLE ABDOMEN - 1 VIEW COMPARISON:  10/15/2020 FINDINGS: NG tube is advanced with the tip is in the stomach and side port at just below the  junction. No dilated large or small bowel. Small amount of oral contrast in the ascending colon. Gas in the rectum. IMPRESSION: 1. NG tube in stomach. 2. No evidence of bowel obstruction Electronically Signed   By: Suzy Bouchard  M.D.   On: 10/16/2020 09:58   DG Abd Portable 1V-Small Bowel Obstruction Protocol-initial, 8 hr delay  Result Date: 10/15/2020 CLINICAL DATA:  Bowel obstruction EXAM: PORTABLE ABDOMEN - 1 VIEW COMPARISON:  10/13/2020 radiograph and CT FINDINGS: Esophageal tube has been retracted, side-port projects over distal esophagus. Dilute contrast within dilated small bowel up to 5.7 cm. Slightly more dense appearing contrast within the right mid abdomen potentially within colon. IMPRESSION: 1. Esophageal tube side-port over the distal esophagus, recommend advancement by approximately 10 cm for more optimal positioning. 2. Dilute contrast within dilated small bowel within the central and left abdomen concerning for bowel obstruction. Small amount of more dense appearing radiopaque material or contrast in the right colon. These results will be called to the ordering clinician or representative by the Radiologist Assistant, and communication documented in the PACS or [employer]. Electronically Signed   By: Donavan Foil M.D.   On: 10/15/2020 21:58   DG Abd Portable 2V  Result Date: 10/17/2020 CLINICAL DATA:  Small bowel obstruction. EXAM: PORTABLE ABDOMEN - 2 VIEW COMPARISON:  October 16, 2020. FINDINGS: The bowel gas pattern is normal. Distal tip of nasogastric tube is seen in proximal stomach. Residual contrast is seen in the colon. There is no evidence of free air. No radio-opaque calculi or other significant radiographic abnormality is seen. IMPRESSION: No evidence of bowel obstruction or ileus. Electronically Signed   By: Marijo Conception M.D.   On: 10/17/2020 08:22    DISCHARGE EXAMINATION: Vitals:   10/20/20 0950 10/20/20 1018 10/20/20 1048 10/20/20 1114  BP: (!) 123/56 (!) 115/56 117/75 122/63  Pulse: 100 (!) 105 (!) 104 (!) 107  Resp: 17 16 14 16   Temp: 97.8 F (36.6 C) (!) 97.5 F (36.4 C) 97.8 F (36.6 C) 97.6 F (36.4 C)  TempSrc: Oral Axillary Oral Oral  SpO2: 100% 97% 96% 97%  Weight:       Height:       General appearance: Awake alert.  In no distress Resp: Clear to auscultation bilaterally.  Normal effort Cardio: S1-S2 is normal regular.  No S3-S4.  No rubs murmurs or bruit .    DISPOSITION: Home  Discharge Instructions    Discharge instructions   Complete by: As directed    You were cared for by a hospitalist during your hospital stay. If you have any questions about your discharge medications or the care you received while you were in the hospital after you are discharged, you can call the unit and asked to speak with the hospitalist on call if the hospitalist that took care of you is not available. Once you are discharged, your primary care physician will handle any further medical issues. Please note that NO REFILLS for any discharge medications will be authorized once you are discharged, as it is imperative that you return to your primary care physician (or establish a relationship with a primary care physician if you do not have one) for your aftercare needs so that they can reassess your need for medications and monitor your lab values. If you do not have a primary care physician, you can call [phone] for a physician referral.   Increase activity slowly   Complete by: As directed  Allergies as of 10/20/2020      Reactions   Alendronate Swelling   Ibandronic Acid Swelling   Latex Hives      Medication List    STOP taking these medications   COLLAGEN PO   dicyclomine 10 MG capsule Commonly known as: BENTYL   fish oil-omega-3 fatty acids 1000 MG capsule   GREEN TEA EXTRACT PO   OVER THE COUNTER MEDICATION   OVER THE COUNTER MEDICATION   oxyCODONE 5 MG immediate release tablet Commonly known as: Oxy IR/ROXICODONE   oxyCODONE-acetaminophen 5-325 MG tablet Commonly known as: PERCOCET/ROXICET   Turmeric 450 MG Caps   vitamin E 180 MG (400 UNITS) capsule     TAKE these medications   fentaNYL 25 MCG/HR Commonly known as: DURAGESIC Place  1 patch onto the skin every 3 (three) days. Start taking on: October 22, 2020   magic mouthwash w/lidocaine Soln Take 5 mLs by mouth 3 (three) times daily as needed for mouth pain.   morphine CONCENTRATE 10 MG/0.5ML Soln concentrated solution Place 0.5 mLs (10 mg total) under the tongue every 3 (three) hours as needed for moderate pain or shortness of breath.   ondansetron 4 MG disintegrating tablet Commonly known as: ZOFRAN-ODT Take 4 mg by mouth every 6 (six) hours as needed.   traZODone 50 MG tablet Commonly known as: DESYREL Take 0.5 tablets (25 mg total) by mouth at bedtime as needed for sleep.         Follow-up Information    [name], MD Follow up.   Specialty: Oncology Contact information: [place] [address] [phone]               TOTAL DISCHARGE TIME: 35-minutes  [facility] Pager on [email or web address]  10/20/2020, 4:24 PM

## 2020-10-20 NOTE — Progress Notes (Signed)
Patient discharged home with husband, discharge instructions given and explained to patient/husband, reviewed G-tube care for home and also to F/U with hosipce nurse at home, they verbalized understanding, patient denies any distress. Accompanied home by husband.

## 2020-10-20 NOTE — Progress Notes (Signed)
Nutrition Follow-up  DOCUMENTATION CODES:   Not applicable  INTERVENTION:  - please consult RD if interventions are needed prior to d/c.   NUTRITION DIAGNOSIS:   Inadequate oral intake related to acute illness (SBO) as evidenced by NPO status,other (comment) (initiation of TPN). -ongoing  GOAL:   Patient will meet greater than or equal to 90% of their needs -met with TPN regimen  MONITOR:   Labs,Weight trends,I & O's,Other (Comment) (TPN regimen)  ASSESSMENT:   Pt with a PMH significant for stage IV pancreatic cancer (diagnosed November 2021) with mets to liver presented with recurrent abdominal pain and N/V. CT imaging of the abdomen reveals high-grade SBO.  Diet advanced from NPO to CLD on 3/15 at 29 and then NPO resumed at midnight last night. The only documented intakes while on CLD were 0% of breakfast and 0% of lunch on 3/16.   Patient previously had NGT to LIS. NGT was removed this AM when a PEG was placed. Discussed with RN, Pharmacist, Oncology NP, and MD. Tube is for venting purposes only, no plan for TF via tube.   She is receiving custom TPN at goal rate of 80 ml/hr which is providing 1916 kcal and 106 grams protein/24 hours. MD states plan is for TPN to be stopped at the time of d/c and she will not be receiving TPN at home.   She has not been weighed since 3/12.  Hospice and Palliative Care are following and patient is going home with hospice.    Labs reviewed; BUN: 38 mg/dl, Ca: 8.7 mg/dl, LFTs slightly elevated.  Medications reviewed.    Diet Order:   Diet Order            Diet NPO time specified Except for: Ice Chips, Sips with Meds  Diet effective now                 EDUCATION NEEDS:   No education needs have been identified at this time  Skin:  Skin Assessment: Reviewed RN Assessment  Last BM:  3/16 (type 4 x1)  Height:   Ht Readings from Last 1 Encounters:  10/13/20 '5\' 8"'  (1.727 m)    Weight:   Wt Readings from Last 1  Encounters:  10/14/20 60.6 kg     Estimated Nutritional Needs:  Kcal:  1800-2000 Protein:  90-105 grams Fluid:  >1.8 L     Jarome Matin, MS, RD, LDN, CNSC Inpatient Clinical Dietitian RD pager # available in AMION  After hours/weekend pager # available in [facility]

## 2020-10-20 NOTE — Progress Notes (Signed)
HEMATOLOGY-ONCOLOGY PROGRESS NOTE  SUBJECTIVE: The patient underwent gastrostomy tube placement earlier today.  Tolerated procedure well.  She initially had some very sharp pain at the incision site.  Received IV Dilaudid with improvement of her pain.  She is not currently having any nausea or vomiting.  Has no other complaints today.  Hospice has been out to the home and has delivered equipment.  They plan to meet the family at 5 PM today.  Oncology History  Pancreatic cancer metastasized to liver ([place])  07/25/2020 Initial Diagnosis   Pancreatic cancer metastasized to liver ([place])   07/25/2020 Cancer Staging   Staging form: Exocrine Pancreas, AJCC 8th Edition - Clinical stage from 07/25/2020: Stage IV (cTX, cN2, pM1) - Signed by [name], MD on 07/25/2020   08/09/2020 - 09/22/2020 Chemotherapy         10/18/2020 - 10/18/2020 Chemotherapy           REVIEW OF SYSTEMS:   Constitutional: Denies fevers, chills Eyes: Denies blurriness of vision Ears, nose, mouth, throat, and face: Denies mucositis or sore throat Respiratory: Denies cough, dyspnea or wheezes Cardiovascular: Denies palpitation, chest discomfort Gastrointestinal: Had sharp pain at the gastrostomy tube insertion site, improved, no nausea or vomiting Skin: Denies abnormal skin rashes Lymphatics: Denies new lymphadenopathy or easy bruising Neurological:Denies numbness, tingling or new weaknesses Behavioral/Psych: Mood is stable, no new changes  Extremities: No lower extremity edema All other systems were reviewed with the patient and are negative.  I have reviewed the past medical history, past surgical history, social history and family history with the patient and they are unchanged from previous note.   PHYSICAL EXAMINATION: ECOG PERFORMANCE STATUS: 3 - Symptomatic, >50% confined to bed  Vitals:   10/20/20 0840 10/20/20 0845  BP: 115/65 114/68  Pulse: (!) 102 (!) 106  Resp: 17 (!) 30  Temp:    SpO2: 96%  95%   Filed Weights   10/13/20 1726 10/14/20 0050  Weight: 61.7 kg 60.6 kg    Intake/Output from previous day: 03/17 0701 - 03/18 0700 In: 1907.8 [I.V.:1877.8; NG/GT:30] Out: 1550 [Emesis/NG output:1550]  GENERAL:alert, no distress and comfortable SKIN: skin color, texture, turgor are normal, no rashes or significant lesions ABDOMEN: No bowel sounds present, soft, mild tenderness, gastrostomy tube in place NEURO: alert & oriented x 3 with fluent speech, no focal motor/sensory deficits  LABORATORY DATA:  I have reviewed the data as listed CMP Latest Ref Rng & Units 10/19/2020 10/18/2020 10/17/2020  Glucose 70 - 99 mg/dL 128(H) 136(H) 138(H)  BUN 8 - 23 mg/dL 38(H) 37(H) 38(H)  Creatinine 0.44 - 1.00 mg/dL 0.60 0.56 0.66  Sodium 135 - 145 mmol/L 138 139 140  Potassium 3.5 - 5.1 mmol/L 3.9 4.0 4.7  Chloride 98 - 111 mmol/L 108 107 107  CO2 22 - 32 mmol/L 21(L) 21(L) 23  Calcium 8.9 - 10.3 mg/dL 8.7(L) 8.9 8.9  Total Protein 6.5 - 8.1 g/dL 6.4(L) 6.0(L) -  Total Bilirubin 0.3 - 1.2 mg/dL 1.0 0.8 -  Alkaline Phos 38 - 126 U/L 115 104 -  AST 15 - 41 U/L 46(H) 30 -  ALT 0 - 44 U/L 48(H) 27 -    Lab Results  Component Value Date   WBC 19.7 (H) 10/20/2020   HGB 11.3 (L) 10/20/2020   HCT 35.7 (L) 10/20/2020   MCV 96.5 10/20/2020   PLT 373 10/20/2020   NEUTROABS 6.6 10/20/2020    DG Abd 1 View  Result Date: 10/13/2020 CLINICAL DATA:  NG tube placement EXAM: ABDOMEN - 1 VIEW COMPARISON:  CT from same day FINDINGS: The tip of the NG tube projects over the gastric body. The side port is near the GE junction.the patient is status post prior vertebral augmentation of the L1 vertebral body. The bowel gas pattern is nonspecific. IMPRESSION: The tip of the NG tube projects over the gastric body. Recommend advancing the tube further into the stomach by approximately 3-5 cm. Electronically Signed   By: Constance Holster M.D.   On: 10/13/2020 21:56   CT Chest W Contrast  Result Date:  10/09/2020 CLINICAL DATA:  Metastatic pancreatic cancer. EXAM: CT CHEST WITH CONTRAST TECHNIQUE: Multidetector CT imaging of the chest was performed during intravenous contrast administration. CONTRAST:  11mL OMNIPAQUE IOHEXOL 300 MG/ML  SOLN COMPARISON:  CT abdomen/pelvis 09/25/2020 and PET-CT 08/01/2020 and chest CT 07/20/2020 FINDINGS: Cardiovascular: The heart is normal in size. No pericardial effusion. The aorta is normal in caliber. No dissection. The branch vessels are patent. Mediastinum/Nodes: Small scattered mediastinal hilar lymph nodes but no mass or overt lymphadenopathy. The esophagus is grossly normal. The thyroid gland is unremarkable. Lungs/Pleura: A few small scattered subpleural pulmonary nodules are stable. No new or progressive findings are identified. Stable basilar scarring changes. No pleural effusions or pleural nodules. Upper Abdomen: Stable diffuse hepatic metastatic disease. Musculoskeletal: No significant bony findings. No findings suspicious for osseous metastatic disease. Scattered hemangiomas are noted. Stable vertebral augmentation changes and L1. IMPRESSION: 1. Stable small scattered subpleural pulmonary nodules. No new or progressive findings. 2. Stable diffuse hepatic metastatic disease. 3. No mediastinal hilar mass or adenopathy. Aortic Atherosclerosis (ICD10-I70.0). Electronically Signed   By: Marijo Sanes M.D.   On: 10/09/2020 14:48   CT ABDOMEN PELVIS W CONTRAST  Result Date: 10/13/2020 CLINICAL DATA:  Lower abdominal pain with nausea and vomiting. EXAM: CT ABDOMEN AND PELVIS WITH CONTRAST TECHNIQUE: Multidetector CT imaging of the abdomen and pelvis was performed using the standard protocol following bolus administration of intravenous contrast. CONTRAST:  149mL OMNIPAQUE IOHEXOL 300 MG/ML  SOLN COMPARISON:  September 25, 2020 FINDINGS: Lower chest: Mild linear scarring and/or atelectasis is seen within the bilateral lung bases. Hepatobiliary: Numerous stable heterogeneous  low-attenuation liver lesions are seen. Status post cholecystectomy. No biliary dilatation. Pancreas: Unremarkable. No pancreatic ductal dilatation or surrounding inflammatory changes. Spleen: Normal in size without focal abnormality. Adrenals/Urinary Tract: Adrenal glands are unremarkable. Kidneys are normal, without renal calculi, focal lesion, or hydronephrosis. The urinary bladder is contracted and subsequently limited in evaluation. Stomach/Bowel: Stomach is within normal limits. Appendix appears normal. A segment of markedly dilated distal duodenum is seen within the left upper quadrant (maximum small bowel diameter of approximately 5.2 cm). An abrupt transition zone is noted within the medial aspect of the mid to upper left abdomen (axial CT images 33 through 40, CT series number 2). Noninflamed diverticula are seen throughout the sigmoid colon. Vascular/Lymphatic: No significant vascular findings are present. No enlarged abdominal or pelvic lymph nodes. Reproductive: Status post hysterectomy. No adnexal masses. Other: No abdominal wall hernia or abnormality. A mild amount of abdominal and pelvic free fluid is noted. Musculoskeletal: Chronic compression fracture deformities are seen at the levels of L1 and L2. IMPRESSION: 1. High-grade proximal small bowel obstruction. 2. Findings consistent with the patient's known liver metastasis. 3. Sigmoid diverticulosis. 4. Mild amount of abdominal and pelvic free fluid. 5. 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 Electronically Signed   By: Virgina Norfolk M.D.   On: 10/13/2020  19:49   DG Abd Portable 1V  Result Date: 10/18/2020 CLINICAL DATA:  Small bowel obstruction EXAM: PORTABLE ABDOMEN - 1 VIEW COMPARISON:  October 17, 2020 FINDINGS: Nasogastric tube tip and side port are in the stomach. There is dilated small bowel in the mid left abdomen. A small amount of contrast is seen in colon. No air-fluid levels. No free air evident. Patient is  status post kyphoplasty at L1. Compression of the L2 vertebral body is stable. IMPRESSION: Nasogastric tube tip and side port in stomach. Loops of dilated bowel in the left mid abdomen. Question ileus versus a degree of obstruction. Free air evident. Electronically Signed   By: Lowella Grip III M.D.   On: 10/18/2020 07:59   DG Abd Portable 1V  Result Date: 10/16/2020 CLINICAL DATA:  Follow-up small bowel obstruction EXAM: PORTABLE ABDOMEN - 1 VIEW COMPARISON:  10/15/2020 FINDINGS: NG tube is advanced with the tip is in the stomach and side port at just below the junction. No dilated large or small bowel. Small amount of oral contrast in the ascending colon. Gas in the rectum. IMPRESSION: 1. NG tube in stomach. 2. No evidence of bowel obstruction Electronically Signed   By: Suzy Bouchard M.D.   On: 10/16/2020 09:58   DG Abd Portable 1V-Small Bowel Obstruction Protocol-initial, 8 hr delay  Result Date: 10/15/2020 CLINICAL DATA:  Bowel obstruction EXAM: PORTABLE ABDOMEN - 1 VIEW COMPARISON:  10/13/2020 radiograph and CT FINDINGS: Esophageal tube has been retracted, side-port projects over distal esophagus. Dilute contrast within dilated small bowel up to 5.7 cm. Slightly more dense appearing contrast within the right mid abdomen potentially within colon. IMPRESSION: 1. Esophageal tube side-port over the distal esophagus, recommend advancement by approximately 10 cm for more optimal positioning. 2. Dilute contrast within dilated small bowel within the central and left abdomen concerning for bowel obstruction. Small amount of more dense appearing radiopaque material or contrast in the right colon. These results will be called to the ordering clinician or representative by the Radiologist Assistant, and communication documented in the PACS or [employer]. Electronically Signed   By: Donavan Foil M.D.   On: 10/15/2020 21:58   DG Abd Portable 2V  Result Date: 10/17/2020 CLINICAL DATA:  Small bowel  obstruction. EXAM: PORTABLE ABDOMEN - 2 VIEW COMPARISON:  October 16, 2020. FINDINGS: The bowel gas pattern is normal. Distal tip of nasogastric tube is seen in proximal stomach. Residual contrast is seen in the colon. There is no evidence of free air. No radio-opaque calculi or other significant radiographic abnormality is seen. IMPRESSION: No evidence of bowel obstruction or ileus. Electronically Signed   By: Marijo Conception M.D.   On: 10/17/2020 08:22    ASSESSMENT AND PLAN: 1.  Metastatic pancreatic adenocarcinoma 2.  CLL stage A 3.  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  Leukocytosis 5.  Normocytic anemia 6.  Mild transaminitis 7.  Goals of care/DNR  -The patient has metastatic pancreatic adenocarcinoma.  She has developed high-grade SBO secondary to carcinomatosis.  Palliative G-tube was placed earlier today.  She is not currently having any nausea or vomiting.  She initially had sharp pain at the insertion site but improved at this time. -Her pain is overall well controlled.  With placement of gastrostomy tube, she is not having any recurrent nausea or vomiting.  At this point, she would be stable for discharge from our standpoint to go home with hospice.  The patient's husband states that they have hospice ready to meet  them at the house at 5 PM today. -Recommend stopping TPN prior to discharge. -Appreciate assistance from hospitalist and palliative care team.  The patient is DNR/DNI.   LOS: 6 days   Mikey Bussing, DNP, AGPCNP-BC, AOCNP 10/20/20

## 2020-10-20 NOTE — Progress Notes (Signed)
PHARMACY - TOTAL PARENTERAL NUTRITION CONSULT NOTE   Indication: Small bowel obstruction  Patient Measurements: Height: 5\' 8"  (172.7 cm) Weight: 60.6 kg (133 lb 9.6 oz) IBW/kg (Calculated) : 63.9 TPN AdjBW (KG): 61.7 Body mass index is 20.31 kg/m.  Recent Labs    10/18/20 0454 10/19/20 0510  NA 139 138  K 4.0 3.9  CL 107 108  CO2 21* 21*  GLUCOSE 136* 128*  BUN 37* 38*  CREATININE 0.56 0.60  CALCIUM 8.9 8.7*  PHOS 3.6 3.0  MG 1.9 2.0  ALBUMIN 2.9* 3.0*  ALKPHOS 104 115  AST 30 46*  ALT 27 48*  BILITOT 0.8 1.0  PREALBUMIN 13.4*  --    Assessment:  75 y/o F with metastatic pancreatic cancer and recent colitis presented with vomiting and was found to have SBO. Pharmacy is consulted to dose TPN.  Glucose / Insulin: no hx of DM.  Glucose< 150. SSI dc'd 3/14 Electrolytes:  3/17 WNL except CO2 remains low at 21.  3/18 No new labs. Renal:  3/17 SCr WNL,  BUN 38 Hepatic: 3/17 LFTs mildly elevated, Tbili wnl. Trig wnl. Prealbumin 11.7/12.2/12.7/13.4 Intake / Output; MIVF: NG 1563mL out yesterday. UOP and stool output not recorded.  Incomplete net I/O. GI Imaging: 3/11: high-grade proximal SBO 3/16 JYN:WGNFA of dilated bowel in the left mid abdomen. Question ileus versus a degree of obstruction. Free air evident. GI Surgeries / Procedures: 3/18 Planning gastrostomy tube placement in IR (inable to schedule on 3/17)  Central access: [facility] 08/07/20 TPN start date: 10/14/2020  Nutritional Goals  Kcal: 1800-2000, Protein 90-105 g, Fluid > 1.8 L TPN - 38ml/hr with 105.6g of protein, 17% dextrose, 20 g of lipids. Provides 1916 kcal / day  Current Nutrition:  NPO, TPN  Plan:  Continue TPN @ goal rate of 80 mL/hr  Electrolytes in TPN: Na 41mEq/L, K 11mEq/L, Ca 67mEq/L, Mg 51mEq/L, resume Phos 5 mEq/L. Cl:Ac 1:2 Add standard MVI daily and trace elements to TPN SSI/CBGs dc'd Monitor TPN labs, BMET, Mg & Phos as needed Follow up plans to discontinue TPN when discharged to home with  hospice.   Gretta Arab PharmD, BCPS Clinical Pharmacist [facility] main pharmacy [phone] 10/20/2020 8:58 AM

## 2020-10-20 NOTE — Progress Notes (Signed)
Daily Progress Note   Patient Name: Tamara Powell       Date: 10/20/2020 DOB: 04/19/1946  Age: 75 y.o. MRN#: [id number] Attending Physician: Bonnielee Haff, MD Primary Care Physician: Angelina Sheriff, MD Admit Date: 10/13/2020  Reason for Consultation/Follow-up: Establishing goals of care  Subjective: Patient is resting in bed.  Spouse at bedside.  NG tube has been discontinued, patient went for G-tube placement earlier today under radiology.  She is complaining of pain around PEG site insertion.  She has just been given IV morphine.  She complains of ongoing pain.  Patient's husband to has questions about functioning of her gastrostomy tube for drainage.   Length of Stay: 6  Current Medications: Scheduled Meds:  . bisacodyl  10 mg Rectal Daily  . chlorhexidine  15 mL Mouth Rinse BID  . Chlorhexidine Gluconate Cloth  6 each Topical Daily  . [START ON 10/21/2020] enoxaparin (LOVENOX) injection  40 mg Subcutaneous Daily  . fentaNYL  1 patch Transdermal Q72H  . fentaNYL      . glucagon (human recombinant)      . lidocaine      . lidocaine      . mouth rinse  15 mL Mouth Rinse q12n4p  . midazolam        Continuous Infusions: . TPN ADULT (ION) 80 mL/hr at 10/19/20 1807    PRN Meds: acetaminophen **OR** acetaminophen, albuterol, alum & mag hydroxide-simeth, HYDROmorphone (DILAUDID) injection, ipratropium, lip balm, magic mouthwash, menthol-cetylpyridinium, morphine CONCENTRATE, ondansetron (ZOFRAN) IV, ondansetron **OR** ondansetron (ZOFRAN) IV, phenol, simethicone, sodium chloride flush, traZODone  Physical Exam         Appears thin, sitting up in bed no distress  regular work of breathing awake alert oriented abdomen not distended but abdomen is tender around the site of PEG  tube insertion.  Vital Signs: BP 122/63 (BP Location: Left Arm)   Pulse (!) 107   Temp 97.6 F (36.4 C) (Oral)   Resp 16   Ht 5\' 8"  (1.727 m)   Wt 60.6 kg   SpO2 97%   BMI 20.31 kg/m  SpO2: SpO2: 97 % O2 Device: O2 Device: Room Air O2 Flow Rate: O2 Flow Rate (L/min): 2 L/min  Intake/output summary:   Intake/Output Summary (Last 24 hours) at 10/20/2020 1139 Last data filed at 10/20/2020  1020 Gross per 24 hour  Intake 1877.84 ml  Output 1775 ml  Net 102.84 ml   LBM: Last BM Date: 10/19/20 (Pt reports small stool after suppository) Baseline Weight: Weight: 61.7 kg Most recent weight: Weight: 60.6 kg     Palliative performance scale 40% Palliative Assessment/Data:    Flowsheet Rows   Flowsheet Row Most Recent Value  Intake Tab   Referral Department Hospitalist  Unit at Time of Referral Med/Surg Unit  Palliative Care Primary Diagnosis Cancer  Date Notified 10/15/20  Palliative Care Type New Palliative care  Reason for referral Clarify Goals of Care  Date of Admission 10/13/20  Date first seen by Palliative Care 10/16/20  # of days Palliative referral response time 1 Day(s)  # of days IP prior to Palliative referral 2  Clinical Assessment   Palliative Performance Scale Score 40%  Psychosocial & Spiritual Assessment   Palliative Care Outcomes   Patient/Family meeting held? Yes  Who was at the meeting? Patient, husband      Patient Active Problem List   Diagnosis Date Noted  . Palliative care by specialist   . 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 Small bowel obstruction ([place]) 10/13/2020  . Pancreatic cancer metastasized to liver ([place]) 07/25/2020  . 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 Goals of care, counseling/discussion 07/19/2020  . Iron deficiency anemia 11/14/2016  . CLL (chronic lymphocytic leukemia) ([place]) 06/02/2012  . Shingles 06/02/2012    Palliative Care Assessment & Plan   Patient  Profile:   Assessment: High-grade small bowel obstruction Malignant obstruction from extensive carcinomatosis Pancreatic cancer   Recommendations/Plan:  Gastrostomy for decompression/venting purposes was done by radiology earlier this morning on 10-20-20.  Home with hospice arrangements underway: Appreciate hospice liaison working with the family, delivery of medical equipment has been done.  Discussed with husband. Appreciate medical oncology Dr. Marin Olp, agree fully with his assessment and recommendations, patient already has established contact with hospice liaison's, recommend home with hospice.  We discussed about adequate pain management going forward.  Patient has transdermal fentanyl.  Discussed about it being uptitrated once the patient is passed at least 48-72-hour mark.  Discussed about IV opioids while the patient is here in the hospital and to suspect patient will receive sublingual concentrated opioids at home on hospice going forward.  Reassured patient that under hospice philosophy of care, utmost attention will be given to avoidance of pain and suffering.  Patient's husband has questions about how the gastrostomy tube will function and whether patient will have a bag, will reach out to hospice liaison  We discussed about no role of artificial nutrition and hydration at end-of-life.  We specifically talked about not using the PEG tube for tube feeds, we specifically talked about discontinuation of TPN once the patient goes home with hospice.  Patient states that she does not have a desire for continuation of artificial nutrition.  She is thankful for clear information that she has been receiving from Dr. Marin Olp, she wishes to spend quality time with her family at home with hospice support towards the end of her life.  Towards this discussion, patient wished to know what to expect when she goes home with hospice and her overall disease trajectory.  With her permission, I  discussed with her about early versus late signs and symptoms at end-of-life.  Discussed frankly but compassionately that earlier in the course, she may have less mobility, she might want to remain in bed more, she may have waxing and  waning mental status.  She may gradually not even have any sensation for thirst or hunger.  Discussed frankly but compassionately that late signs could be decreased response to verbal and visual stimuli, diminishing urine output, increased respiratory secretions among other signs.  Reassured the patient that the scope of hospice services at home will be to primarily establish comfort at all stages.  Offered space and time for reflection and discussed all of the above to the best of my ability after the patient wished to to seek more knowledge about the actual dying process.    Cussed with bedside RN, opioid rotate from IV morphine to IV Dilaudid while the patient is here in the hospital for pain control, monitor if the patient is having adequate analgesia.  Continue transdermal fentanyl patch.  If the patient is here over the weekend, will uptitrate.   Code Status:    Code Status Orders  (From admission, onward)         Start     Ordered   10/18/20 0702  Do not attempt resuscitation (DNR)  Continuous       Question Answer Comment  In the event of cardiac or respiratory ARREST Do not call a "code blue"   In the event of cardiac or respiratory ARREST Do not perform Intubation, CPR, defibrillation or ACLS   In the event of cardiac or respiratory ARREST Use medication by any route, position, wound care, and other measures to relive pain and suffering. May use oxygen, suction and manual treatment of airway obstruction as needed for comfort.      10/18/20 0703        Code Status History    Date Active Date Inactive Code Status Order ID Comments User Context   10/14/2020 0129 10/18/2020 0703 Full Code [id number]  Harvie Bridge, DO Inpatient   Advance Care Planning  Activity       Prognosis:   < 6 months  Discharge Planning:  Home with Hospice  Care plan was discussed with patient and interdisciplinary team  Thank you for allowing the Palliative Medicine Team to assist in the care of this patient.   Time In:  1100 Time Out: 1 135 Total Time 35 Prolonged Time Billed No.       Greater than 50%  of this time was spent counseling and coordinating care related to the above assessment and plan.  Loistine Chance, MD  Please contact Palliative Medicine Team phone at [phone] for questions and concerns.

## 2020-10-20 NOTE — Care Management Important Message (Signed)
Important Message  Patient Details IM Letter given to the Patient Name: Tamara Powell MRN: [id number] Date of Birth: February 23, 1946   Medicare Important Message Given:  Yes     [name] 10/20/2020, 9:49 AM

## 2020-10-20 NOTE — Progress Notes (Signed)
   Referral follow up:   The equipment is in the home and the pt's family is ready to receive pt when ready for discharge. The husband Jenny Reichmann has confirmed that they will take her home by car and we have a nurse who will see the pt in the home after she gets there.  I have updated Juliann Pulse with transitions of care team and she will update all providers.   Webb Silversmith RN [phone]

## 2020-10-20 NOTE — Procedures (Signed)
Pre procedure Dx: Dysphagia Post Procedure Dx: Same  Successful fluoroscopic guided insertion of gastrostomy tube.   The gastrostomy tube may be used immediately for ventilatory purposes and/or medications.   Tube feeds may be initiated in 24 hours as per the primary team.    EBL: Trace Complications: None immediate  Ronny Bacon, MD Pager #: [phone]

## 2020-10-20 NOTE — Progress Notes (Signed)
MEDICATION-RELATED CONSULT NOTE   IR Procedure Consult - Anticoagulant/Antiplatelet PTA/Inpatient Med List Review by Pharmacist    Procedure: fluoroscopic guided insertion of gastrostomy tube Completed: 3/18 at 09:15  Post-Procedural bleeding risk per IR MD assessment:  Standard  Antithrombotic medications on inpatient or PTA profile prior to procedure:   Enoxaparin 40 mg SQ q24h  Recommended restart time per IR Post-Procedure Guidelines:  Day + 1 (Next AM)  Plan:     Resume Enoxaparin 40mg  SQ Q24h on 3/19 at 10:00  Gretta Arab PharmD, [facility] main pharmacy [phone] 10/20/2020 10:44 AM

## 2020-10-25 ENCOUNTER — Inpatient Hospital Stay: Payer: Medicare Other

## 2020-11-03 DEATH — deceased

## 2020-11-09 ENCOUNTER — Ambulatory Visit: Payer: Medicare Other | Admitting: Hematology & Oncology

## 2020-11-09 ENCOUNTER — Other Ambulatory Visit: Payer: Medicare Other

## 2020-11-09 ENCOUNTER — Ambulatory Visit: Payer: Medicare Other

## 2021-03-09 ENCOUNTER — Other Ambulatory Visit: Payer: Medicare Other

## 2021-03-09 ENCOUNTER — Ambulatory Visit: Payer: Medicare Other | Admitting: Hematology & Oncology

## 2021-07-16 IMAGING — CT CT CHEST W/ CM
2 of 3 series · 15 of 36 positions shown, 18 images · IV contrast (Omnipaque)
Comparison: CT abdomen/pelvis 09/25/2020 and PET-CT 08/01/2020 and
chest CT 07/20/2020

CLINICAL DATA: Metastatic pancreatic cancer.

EXAM:
CT CHEST WITH CONTRAST
TECHNIQUE: Multidetector CT imaging of the chest was performed during
intravenous contrast administration.
CONTRAST:  80mL OMNIPAQUE IOHEXOL 300 MG/ML  SOLN

[Series 2: axial st · axial · 0.64mm/px · z∈[-299,-23]mm · 12 of 162 slices shown, 15 images]
[im 12/162  mediastinal]
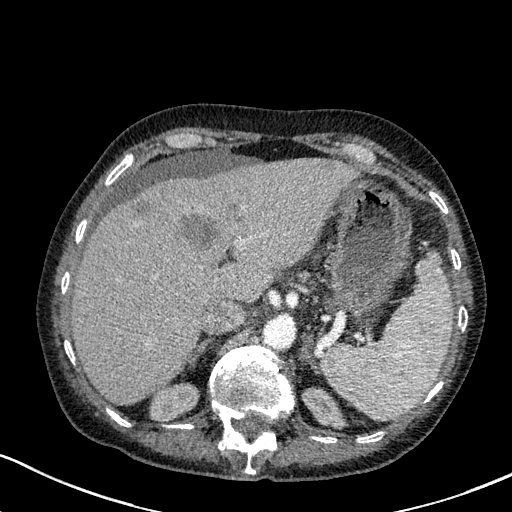
[im 12/162  lung]
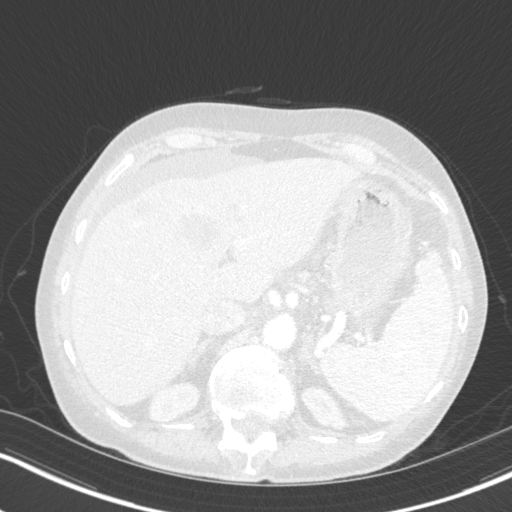
[im 24/162  lung]
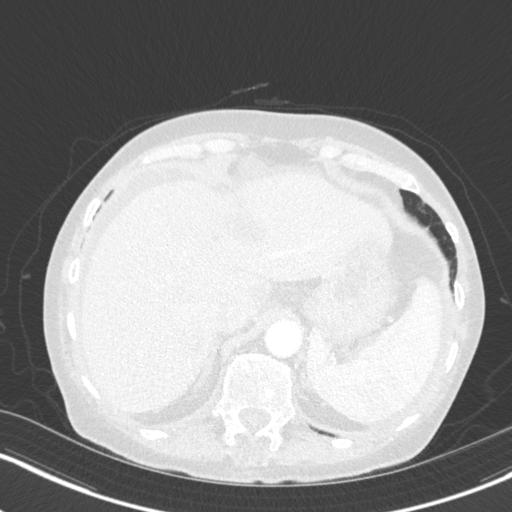
[im 36/162  lung]
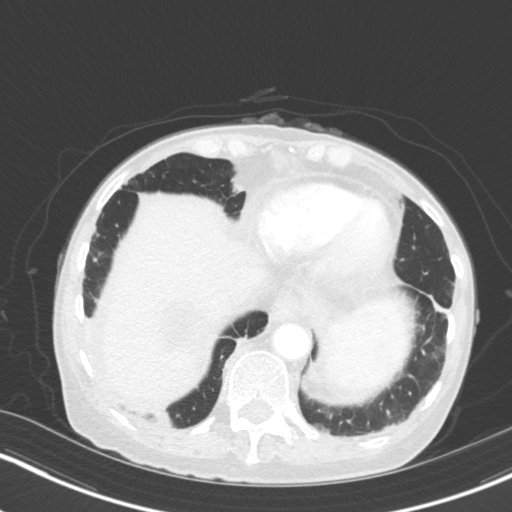
[im 48/162  lung]
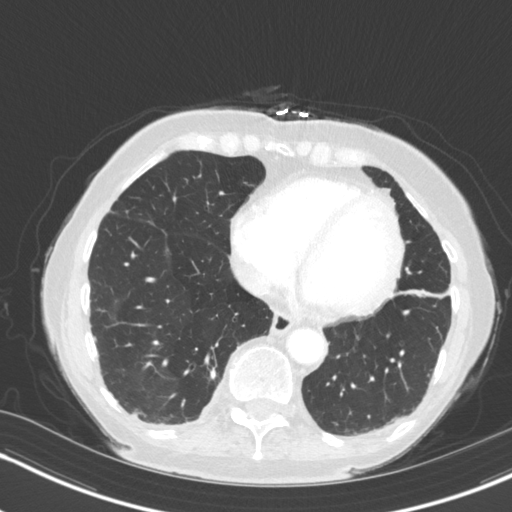
[im 60/162  mediastinal]
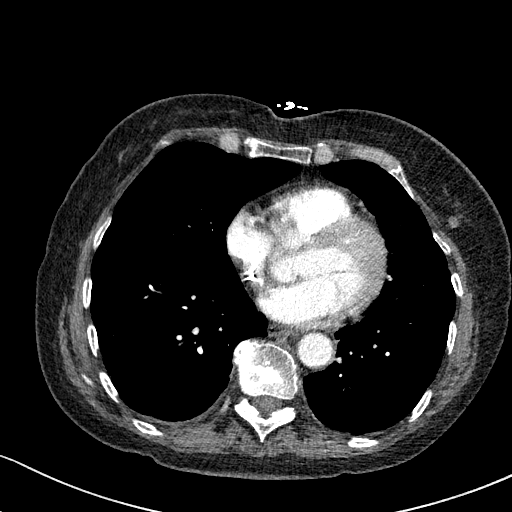
[im 60/162  lung]
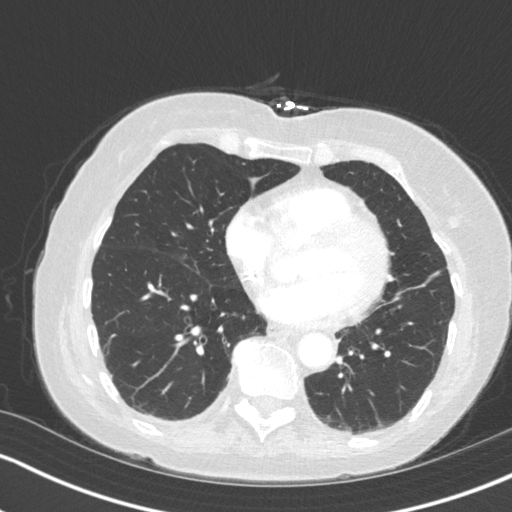
[im 72/162  lung]
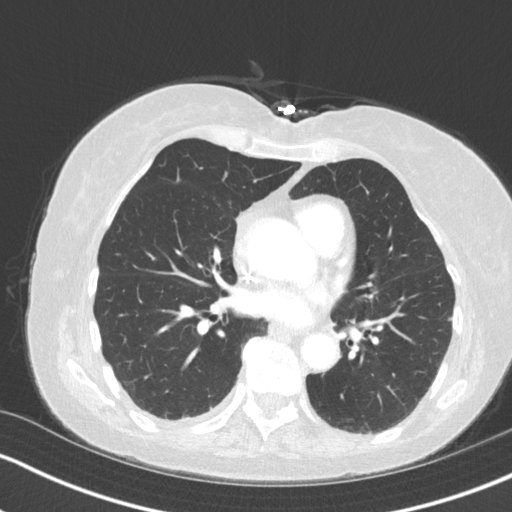
[im 90/162  lung]
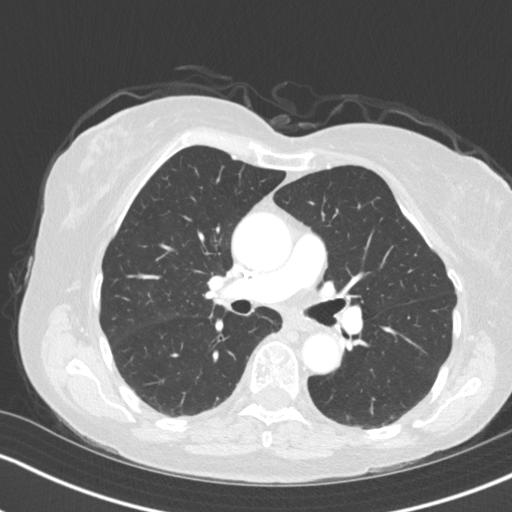
[im 102/162  lung]
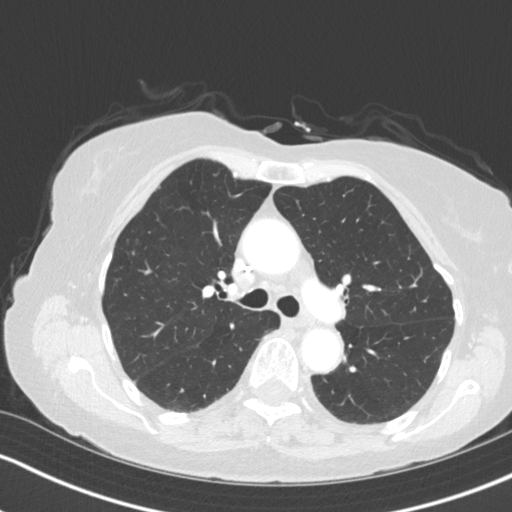
[im 114/162  mediastinal]
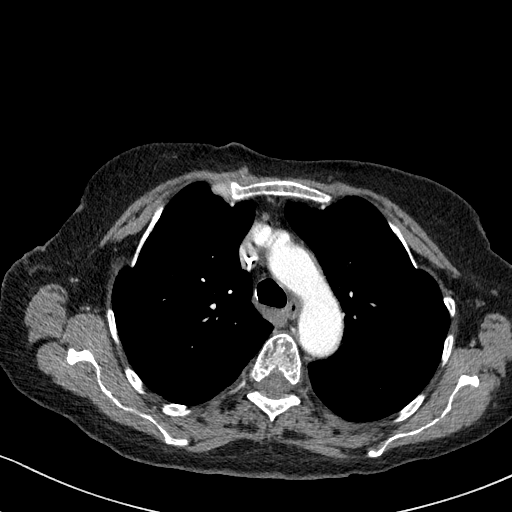
[im 114/162  lung]
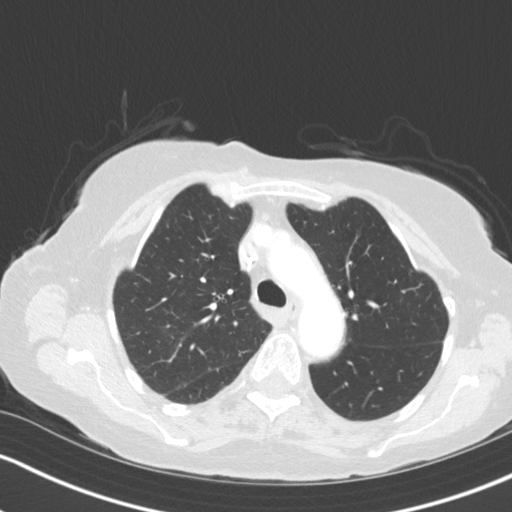
[im 126/162  lung]
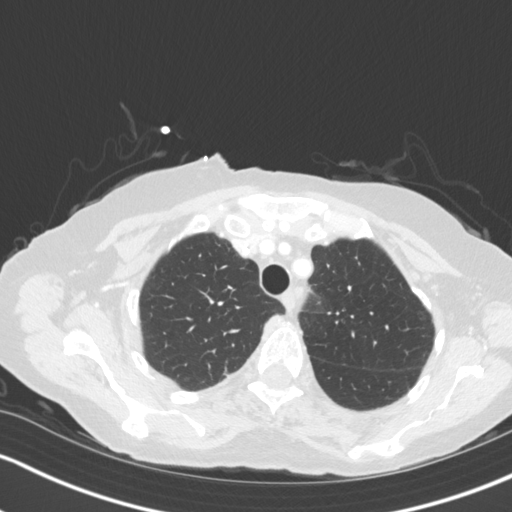
[im 138/162  lung]
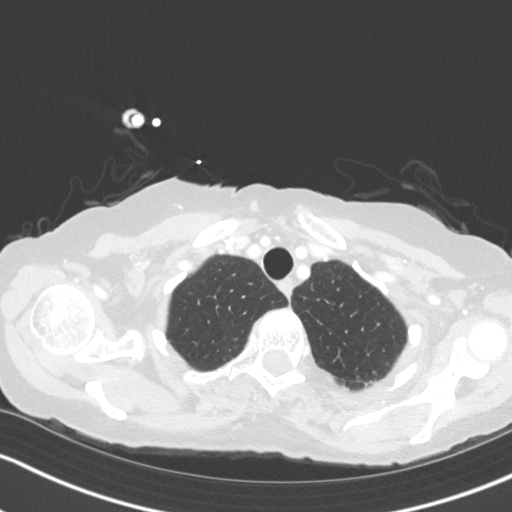
[im 150/162  lung]
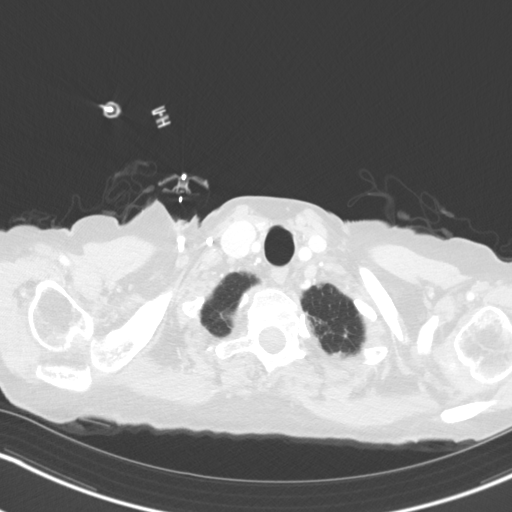

[Series 5: coronal · coronal · 0.60mm/px · 3 of 132 slices shown]
[im 27/132  lung]
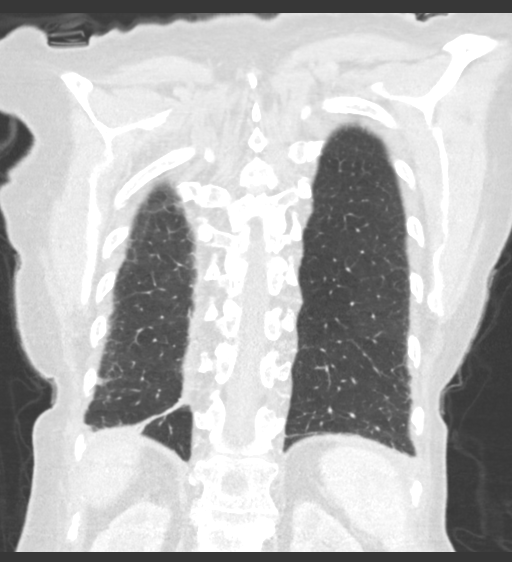
[im 53/132  lung]
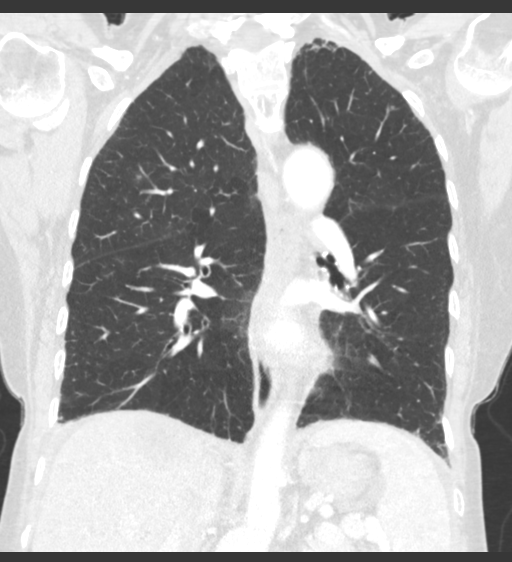
[im 79/132  lung]
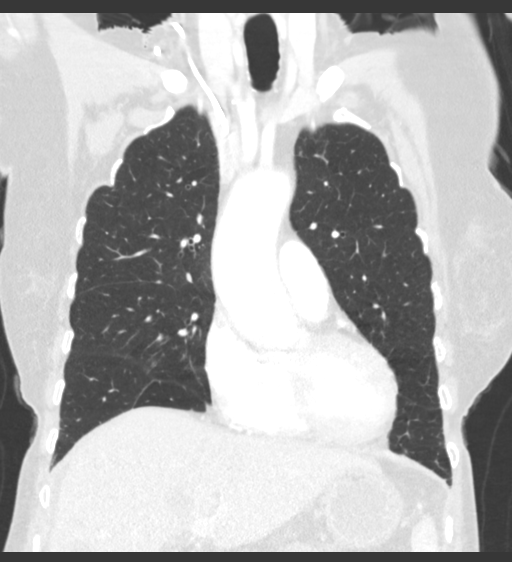

[15 of 36 positions shown; findings below may reference images not displayed]

FINDINGS: Cardiovascular: The heart is normal in size. No pericardial
effusion. The aorta is normal in caliber. No dissection. The branch
vessels are patent.

Mediastinum/Nodes: Small scattered mediastinal hilar lymph nodes but
no mass or overt lymphadenopathy. The esophagus is grossly normal.
The thyroid gland is unremarkable.

Lungs/Pleura: A few small scattered subpleural pulmonary nodules are
stable. No new or progressive findings are identified. Stable
basilar scarring changes. No pleural effusions or pleural nodules.

Upper Abdomen: Stable diffuse hepatic metastatic disease.

Musculoskeletal: No significant bony findings. No findings
suspicious for osseous metastatic disease. Scattered hemangiomas are
noted. Stable vertebral augmentation changes and L1.
IMPRESSION: 1. Stable small scattered subpleural pulmonary nodules. No new or
progressive findings.
2. Stable diffuse hepatic metastatic disease.
3. No mediastinal hilar mass or adenopathy.

Aortic Atherosclerosis ([id number]).

## 2021-07-24 IMAGING — DX DG ABD PORTABLE 2V
2 series · 2 of 2 positions shown · non-contrast
Comparison: October 16, 2020.

CLINICAL DATA: Small bowel obstruction.

EXAM:
PORTABLE ABDOMEN - 2 VIEW

[abdomen erect]
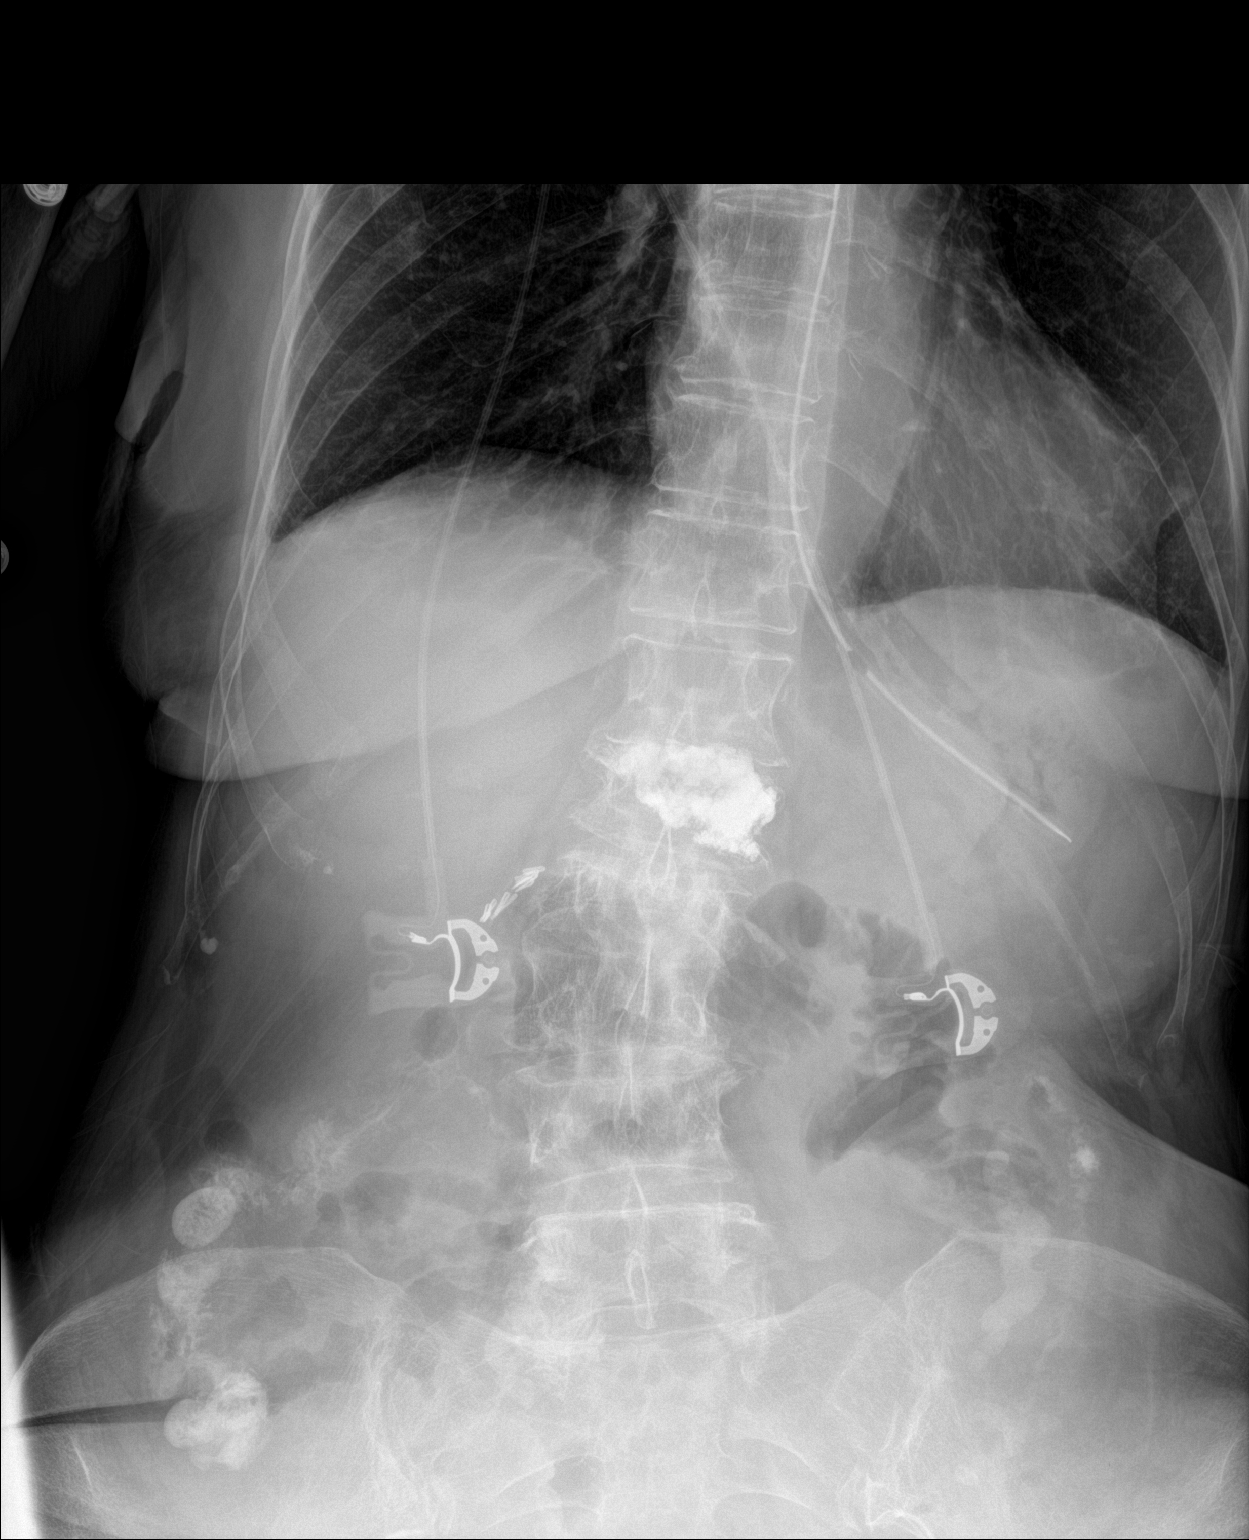

[abdomen kub]
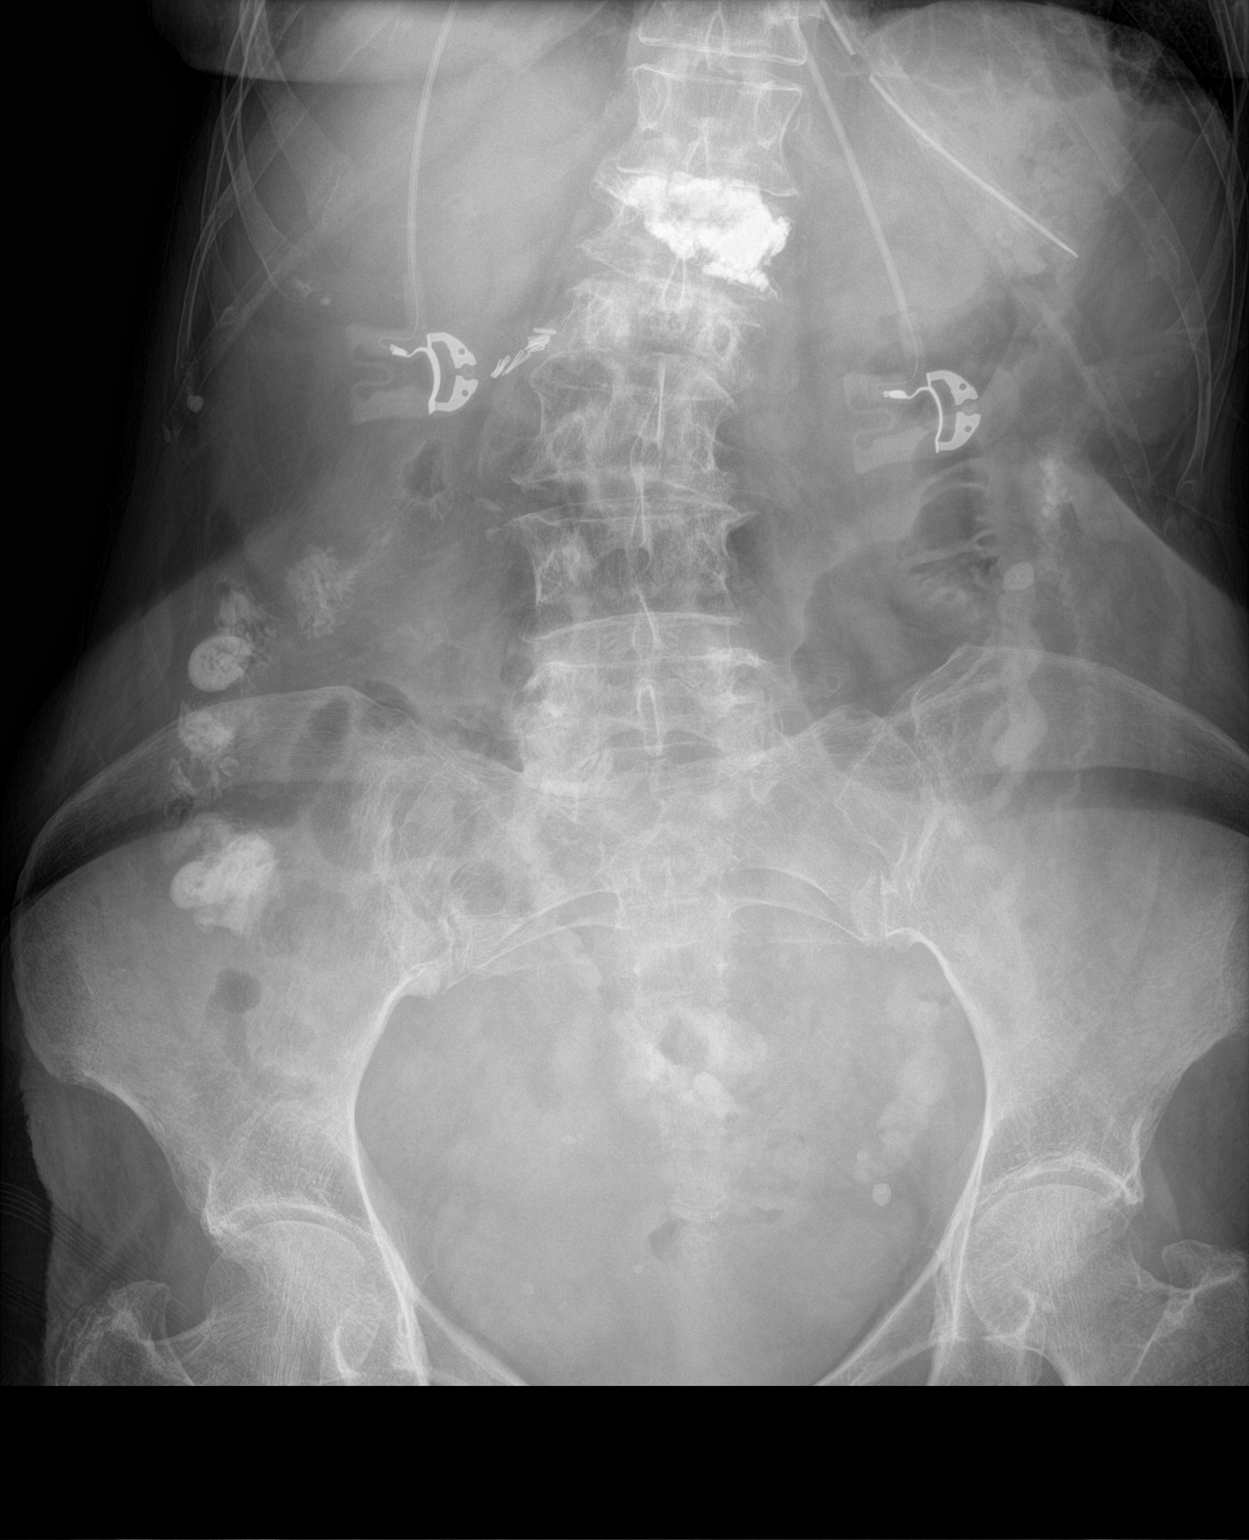

[2 of 2 positions shown; findings below may reference images not displayed]

FINDINGS: The bowel gas pattern is normal. Distal tip of nasogastric tube is
seen in proximal stomach. Residual contrast is seen in the colon.
There is no evidence of free air. No radio-opaque calculi or other
significant radiographic abnormality is seen.
IMPRESSION: No evidence of bowel obstruction or ileus.
